# Patient Record
Sex: Female | Born: 1965 | Race: White | Hispanic: No | Smoking: Former smoker
Health system: Southern US, Community
[De-identification: ages and names within clinical notes are randomized; demographics above are authoritative.]

## PROBLEM LIST (undated history)

## (undated) DIAGNOSIS — C3481 Malignant neoplasm of overlapping sites of right bronchus and lung: Secondary | ICD-10-CM

## (undated) DIAGNOSIS — I10 Essential (primary) hypertension: Secondary | ICD-10-CM

## (undated) DIAGNOSIS — R51 Headache: Secondary | ICD-10-CM

## (undated) DIAGNOSIS — M542 Cervicalgia: Secondary | ICD-10-CM

## (undated) DIAGNOSIS — J189 Pneumonia, unspecified organism: Secondary | ICD-10-CM

## (undated) DIAGNOSIS — J449 Chronic obstructive pulmonary disease, unspecified: Secondary | ICD-10-CM

## (undated) HISTORY — DX: Malignant neoplasm of overlapping sites of right bronchus and lung: C34.81

## (undated) HISTORY — PX: CERVICAL FUSION: SHX112

## (undated) HISTORY — PX: APPENDECTOMY: SHX54

## (undated) MED FILL — Pembrolizumab IV Soln 100 MG/4ML (25 MG/ML): INTRAVENOUS | Qty: 8 | Status: AC

---

## 1998-10-07 ENCOUNTER — Other Ambulatory Visit: Admission: RE | Admit: 1998-10-07 | Discharge: 1998-10-07 | Payer: Self-pay | Admitting: Family Medicine

## 1998-12-18 ENCOUNTER — Encounter: Payer: Self-pay | Admitting: Emergency Medicine

## 1998-12-18 ENCOUNTER — Emergency Department (HOSPITAL_COMMUNITY): Admission: EM | Admit: 1998-12-18 | Discharge: 1998-12-18 | Payer: Self-pay | Admitting: Emergency Medicine

## 2000-05-28 ENCOUNTER — Encounter: Admission: RE | Admit: 2000-05-28 | Discharge: 2000-05-28 | Payer: Self-pay | Admitting: Family Medicine

## 2000-05-28 ENCOUNTER — Encounter: Payer: Self-pay | Admitting: Family Medicine

## 2000-07-17 ENCOUNTER — Other Ambulatory Visit: Admission: RE | Admit: 2000-07-17 | Discharge: 2000-07-17 | Payer: Self-pay | Admitting: Family Medicine

## 2001-12-31 ENCOUNTER — Other Ambulatory Visit: Admission: RE | Admit: 2001-12-31 | Discharge: 2001-12-31 | Payer: Self-pay | Admitting: Family Medicine

## 2003-02-12 ENCOUNTER — Encounter: Admission: RE | Admit: 2003-02-12 | Discharge: 2003-02-12 | Payer: Self-pay | Admitting: Family Medicine

## 2003-05-08 ENCOUNTER — Other Ambulatory Visit: Admission: RE | Admit: 2003-05-08 | Discharge: 2003-05-08 | Payer: Self-pay | Admitting: Family Medicine

## 2003-10-04 ENCOUNTER — Encounter: Admission: RE | Admit: 2003-10-04 | Discharge: 2003-10-04 | Payer: Self-pay | Admitting: Family Medicine

## 2003-10-22 ENCOUNTER — Inpatient Hospital Stay (HOSPITAL_COMMUNITY): Admission: RE | Admit: 2003-10-22 | Discharge: 2003-10-23 | Payer: Self-pay | Admitting: Neurosurgery

## 2004-09-13 ENCOUNTER — Other Ambulatory Visit: Admission: RE | Admit: 2004-09-13 | Discharge: 2004-09-13 | Payer: Self-pay | Admitting: Family Medicine

## 2005-01-09 ENCOUNTER — Ambulatory Visit: Payer: Self-pay | Admitting: Family Medicine

## 2005-10-09 ENCOUNTER — Encounter: Admission: RE | Admit: 2005-10-09 | Discharge: 2005-10-09 | Payer: Self-pay | Admitting: Family Medicine

## 2006-04-06 ENCOUNTER — Other Ambulatory Visit: Admission: RE | Admit: 2006-04-06 | Discharge: 2006-04-06 | Payer: Self-pay | Admitting: Family Medicine

## 2007-01-25 ENCOUNTER — Ambulatory Visit (HOSPITAL_COMMUNITY): Admission: RE | Admit: 2007-01-25 | Discharge: 2007-01-25 | Payer: Self-pay | Admitting: Family Medicine

## 2010-06-10 NOTE — Op Note (Signed)
NAMEELIM, PEALE               ACCOUNT NO.:  [id number]   MEDICAL RECORD NO.:  [id number]          PATIENT TYPE:  INP   LOCATION:  2899                         FACILITY:  [facility]   PHYSICIAN:  Hewitt Shorts, M.D.DATE OF BIRTH:  1965-04-06   DATE OF PROCEDURE:  10/22/2003  DATE OF DISCHARGE:                                 OPERATIVE REPORT   PREOPERATIVE DIAGNOSIS:  C6-7 cervical disk herniation, cervical  spondylosis, cervical degenerative disk disease, and cervical radiculopathy.   POSTOPERATIVE DIAGNOSIS:  C6-7 cervical disk herniation, cervical  spondylosis, cervical degenerative disk disease, and cervical radiculopathy.   OPERATION PERFORMED:  1.  Removal of anterior cervical plate at U9-8.  2.  C6-7 anterior cervical diskectomy and arthrodesis with iliac crest      allograft and Synthes cervical plating.   SURGEON:  Hewitt Shorts, M.D.   ASSISTANT:  Coletta Memos, M.D.   ANESTHESIA:  General endotracheal.   INDICATIONS FOR PROCEDURE:  The patient is a 45 year old woman who is seven  years status post a C5-6 anterior cervical diskectomy and arthrodesis.  She  did well from that surgery but returned with right cervical radiculopathy  and was found by MRI scan to have C6-7 cervical disk herniation off to the  right side.  Decision was made to proceed with removal of the plate at J1-9  and then proceeding with a C6-7 anterior cervical diskectomy and fusion.   DESCRIPTION OF PROCEDURE:  The patient was brought to the operating room and  placed under general endotracheal anesthesia.  The patient was placed in 10  pounds of halter traction.  The neck was prepped with Betadine soap and  solution and draped in sterile fashion.  The previous incision was  infiltrated with local anesthetic and epinephrine.  The previous incision  was reopened.  Dissection was carried down through the subcutaneous tissue  and platysma and then dissection was carried out to an avascular  plane,  leaving the sternocleidomastoid, carotid artery and jugular vein laterally  and trachea and esophagus medially.  The ventral aspect of the vertebral  column identified and the previous plate at the J4-7 level was identified  and the overlying scar tissue was gently dissected.  We exposed the screws.  The locking screws were all removed and then all four bone screws were  removed and the plate was removed.  We were able to immediately identify the  C6-7 intervertebral disk space.  There was spondylitic ventral disk  protrusion.  The annulus was incised and spondylitic overgrowth anteriorly  was removed using osteophyte removal tool.  We entered into the disk space  and diskectomy was initiated with microcurettes and pituitary rongeurs.  The  operating microscope was draped and brought into the field to provide  additional magnification, illumination and visualization and the remainder  of the decompression was performed using microdissection and microsurgical  technique.  The cartilaginous end plates of the corresponding vertebrae were  removed using X-Max drill along with microcurettes and posterior osteophytic  overgrowth was removed using the X-Max drill along with 2 mm Kerrison punch  with a thin  foot plate.  The posterior longitudinal ligament was thickened.  This was carefully opened and the disk herniation encountered.  It was  removed in a piecemeal fashion and extended in a broad based fashion with  the largest component laterally to the right compressing the exiting right  C7 nerve root.  In the end, all loose fragments of disk material were  removed and good decompression of the spinal canal and thecal sac as well as  the foramina and nerve roots was achieved bilaterally.  Once the  decompression was completed, hemostasis was established with the use of  Gelfoam soaked in Thrombin.  All the Gelfoam though was removed prior to  proceeding with the arthrodesis.  Using bone  sizers, we selected a 7 mm  graft which was hydrated in saline solution.  The graft was then positioned  in the intervertebral disk space and countersunk.  We then selected a 14 mm  CSLP Synthes plate and it was positioned over the fusion construct and  placed in an upside down orientation.  We used a 14 mm plate and 4.0 x 14 mm  cortical screws.  Screws were placed at C6 and then we drilled and tapped  holes for the screws at C7.  The screws were placed.  All four screws were  fully tightened.  Locking screws were placed.  The traction was discontinued  and an x-ray was taken which showed the screws were in good position at C6.  Graft was in good position.  The screws at C7 could not be fully visualized  because of the patient's shoulders.  However, under direct visualization,  the overall construct appeared good.  The wound was irrigated with  bacitracin solution, checked for hemostasis which was established and  confirmed and then we proceeded with closure.  The platysma was closed with  interrupted inverted 2-0 undyed Vicryl sutures.  Subcutaneous and  subcuticular layer were closed with interrupted inverted 3-0 undyed Vicryl  sutures and the skin edges were approximated with DermaBond.  The patient  tolerated the procedure well.  The estimated blood loss was 25 mL.  Sponge,  needle and instrument counts were correct.  Following surgery, the patient  was to be placed in a soft cervical collar, reversed from his anesthetic,  extubated and transferred to the recovery room for further care.       RWN/MEDQ  D:  10/22/2003  T:  10/23/2003  Job:  [id number]

## 2011-05-08 ENCOUNTER — Other Ambulatory Visit: Payer: Self-pay | Admitting: Neurosurgery

## 2011-05-09 ENCOUNTER — Encounter (HOSPITAL_COMMUNITY): Payer: Self-pay | Admitting: Pharmacy Technician

## 2011-05-11 ENCOUNTER — Encounter (HOSPITAL_COMMUNITY)
Admission: RE | Admit: 2011-05-11 | Discharge: 2011-05-11 | Disposition: A | Discharge status: Home / Self Care | Payer: BC Managed Care – PPO | Source: Ambulatory Visit | Attending: Neurosurgery | Admitting: Neurosurgery

## 2011-05-11 ENCOUNTER — Encounter (HOSPITAL_COMMUNITY): Payer: Self-pay

## 2011-05-11 HISTORY — DX: Essential (primary) hypertension: I10

## 2011-05-11 HISTORY — DX: Cervicalgia: M54.2

## 2011-05-11 HISTORY — DX: Headache: R51

## 2011-05-11 HISTORY — DX: Chronic obstructive pulmonary disease, unspecified: J44.9

## 2011-05-11 LAB — BASIC METABOLIC PANEL
BUN: 7 mg/dL (ref 6–23)
CO2: 27 mEq/L (ref 19–32)
Calcium: 9.5 mg/dL (ref 8.4–10.5)
Chloride: 96 mEq/L (ref 96–112)
Creatinine, Ser: 0.59 mg/dL (ref 0.50–1.10)
GFR calc Af Amer: >90 mL/min (ref >90)
GFR calc non Af Amer: >90 mL/min (ref >90)
Glucose, Bld: 84 mg/dL (ref 70–99)
Potassium: 4.1 mEq/L (ref 3.5–5.1)
Sodium: 131 mEq/L — ABNORMAL LOW (ref 135–145)

## 2011-05-11 LAB — CBC
HCT: 36.7 % (ref 36.0–46.0)
Hemoglobin: 13.1 g/dL (ref 12.0–15.0)
MCH: 32.3 pg (ref 26.0–34.0)
MCHC: 35.7 g/dL (ref 30.0–36.0)
MCV: 90.6 fL (ref 78.0–100.0)
Platelets: 262 10*3/uL (ref 150–400)
RBC: 4.05 MIL/uL (ref 3.87–5.11)
RDW: 11.9 % (ref 11.5–15.5)
WBC: 7.6 10*3/uL (ref 4.0–10.5)

## 2011-05-11 LAB — SURGICAL PCR SCREEN
MRSA, PCR: NEGATIVE
Staphylococcus aureus: NEGATIVE

## 2011-05-11 LAB — NO BLOOD PRODUCTS

## 2011-05-11 NOTE — Pre-Procedure Instructions (Signed)
20 Heather Gutierrez  05/11/2011   Your procedure is scheduled on:  April 22  Report to [facility] at 0530 AM.  Call this number if you have problems the morning of surgery: [phone]   Remember:   Do not eat food:After Midnight.  May have clear liquids: up to 4 Hours before arrival.  Clear liquids include soda, tea, black coffee, apple or grape juice, broth.  Take these medicines the morning of surgery with A SIP OF WATER: NONE   Do not wear jewelry, make-up or nail polish.  Do not wear lotions, powders, or perfumes. You may wear deodorant.  Do not shave 48 hours prior to surgery.  Do not bring valuables to the hospital.  Contacts, dentures or bridgework may not be worn into surgery.  Leave suitcase in the car. After surgery it may be brought to your room.  For patients admitted to the hospital, checkout time is 11:00 AM the day of discharge.   Patients discharged the day of surgery will not be allowed to drive home.  Name and phone number of your driver: FAMILY  Special Instructions: CHG Shower Use Special Wash: 1/2 bottle night before surgery and 1/2 bottle morning of surgery.   Please read over the following fact sheets that you were given: Pain Booklet, MRSA Information and Surgical Site Infection Prevention

## 2011-05-11 NOTE — Progress Notes (Signed)
REQUESTED RECORDS [facility], DR [facility] CXR,& EKG,OTHER STUDIES

## 2011-05-12 NOTE — Progress Notes (Addendum)
Left message for [facility] in medical records requesting EKG from [facility].

## 2011-05-12 NOTE — Consult Note (Signed)
Anesthesia Chart Review:  Patient is a 46 year old female scheduled for C4-5 ACDF on 05/15/11.  History includes former smoker, COPD, headaches, HTN, neck pain, appendectomy, and cervical fusion.  PCP is Maura Hamrick.   Labs noted.  She has refused all blood products.  H//H 13.1/36.7.  She was evaluated for SOB/CXR on 04/17/11 at [facility].  She had a CXR, CTA chest, and stress echo.  CXR showed minimal bibasilar ATX, minimal vascular congestion.  CTA showed no PE, mild emphysema, minimal bibasilar ATX, scattered 3mm pulmonary nodules bilaterally thought to be post-infectious in nature.   Stress echo showed normal exercise tolerance, negative treadmill for ischemia, negative stress echo for ischemia.  EF was estimated at 55-60%.    Patient was ultimately diagnosed and treated for acute bronchitis.  An EKG is still pending.  I've asked the staff to re-request from [facility].    Since she had a negative stress echo just last month, anticipate she can proceed.  Would not plan to repeat her CXR preoperatively unless she is having new or significant residual respiratory symptoms from her bronchitis last month.

## 2011-05-14 MED ORDER — CEFAZOLIN SODIUM 1-5 GM-% IV SOLN
1.0000 g | INTRAVENOUS | Status: AC
  Administered 2011-05-15: 1 g via INTRAVENOUS
  Filled 2011-05-14: qty 50

## 2011-05-15 ENCOUNTER — Encounter (HOSPITAL_COMMUNITY)
Admission: RE | Disposition: A | Discharge status: Home / Self Care | Payer: Self-pay | Source: Ambulatory Visit | Attending: Neurosurgery

## 2011-05-15 ENCOUNTER — Encounter (HOSPITAL_COMMUNITY): Payer: Self-pay | Admitting: *Deleted

## 2011-05-15 ENCOUNTER — Ambulatory Visit (HOSPITAL_COMMUNITY)
Admission: RE | Admit: 2011-05-15 | Discharge: 2011-05-16 | Disposition: A | Discharge status: Home / Self Care | Payer: BC Managed Care – PPO | Source: Ambulatory Visit | Attending: Neurosurgery | Admitting: Neurosurgery

## 2011-05-15 ENCOUNTER — Ambulatory Visit (HOSPITAL_COMMUNITY): Payer: BC Managed Care – PPO | Admitting: Vascular Surgery

## 2011-05-15 ENCOUNTER — Ambulatory Visit (HOSPITAL_COMMUNITY): Payer: BC Managed Care – PPO

## 2011-05-15 ENCOUNTER — Encounter (HOSPITAL_COMMUNITY): Payer: Self-pay | Admitting: Vascular Surgery

## 2011-05-15 DIAGNOSIS — G8250 Quadriplegia, unspecified: Secondary | ICD-10-CM | POA: Insufficient documentation

## 2011-05-15 DIAGNOSIS — M4712 Other spondylosis with myelopathy, cervical region: Secondary | ICD-10-CM | POA: Insufficient documentation

## 2011-05-15 DIAGNOSIS — R51 Headache: Secondary | ICD-10-CM | POA: Insufficient documentation

## 2011-05-15 DIAGNOSIS — J4489 Other specified chronic obstructive pulmonary disease: Secondary | ICD-10-CM | POA: Insufficient documentation

## 2011-05-15 DIAGNOSIS — Z01812 Encounter for preprocedural laboratory examination: Secondary | ICD-10-CM | POA: Insufficient documentation

## 2011-05-15 DIAGNOSIS — I10 Essential (primary) hypertension: Secondary | ICD-10-CM | POA: Insufficient documentation

## 2011-05-15 DIAGNOSIS — M5000 Cervical disc disorder with myelopathy, unspecified cervical region: Secondary | ICD-10-CM | POA: Insufficient documentation

## 2011-05-15 DIAGNOSIS — J449 Chronic obstructive pulmonary disease, unspecified: Secondary | ICD-10-CM | POA: Insufficient documentation

## 2011-05-15 HISTORY — PX: ANTERIOR CERVICAL DECOMP/DISCECTOMY FUSION: SHX1161

## 2011-05-15 SURGERY — ANTERIOR CERVICAL DECOMPRESSION/DISCECTOMY FUSION 1 LEVEL
Anesthesia: General

## 2011-05-15 MED ORDER — MORPHINE SULFATE 4 MG/ML IJ SOLN
4.0000 mg | INTRAMUSCULAR | Status: DC | PRN
  Administered 2011-05-15: 4 mg via INTRAMUSCULAR
  Filled 2011-05-15: qty 1

## 2011-05-15 MED ORDER — BUPIVACAINE HCL (PF) 0.25 % IJ SOLN
INTRAMUSCULAR | Status: DC | PRN
  Administered 2011-05-15: 5 mL

## 2011-05-15 MED ORDER — KCL IN DEXTROSE-NACL 20-5-0.45 MEQ/L-%-% IV SOLN
INTRAVENOUS | Status: AC
  Administered 2011-05-15: 1000 mL
  Filled 2011-05-15: qty 1000

## 2011-05-15 MED ORDER — DROPERIDOL 2.5 MG/ML IJ SOLN
0.6250 mg | INTRAMUSCULAR | Status: DC | PRN
Start: 2011-05-15 — End: 2011-05-15

## 2011-05-15 MED ORDER — ALUM & MAG HYDROXIDE-SIMETH 200-200-20 MG/5ML PO SUSP: 30.0000 mL | Freq: Four times a day (QID) | ORAL | Status: DC | PRN

## 2011-05-15 MED ORDER — MENTHOL 3 MG MT LOZG: 1.0000 | LOZENGE | OROMUCOSAL | Status: DC | PRN

## 2011-05-15 MED ORDER — SODIUM CHLORIDE 0.9 % IJ SOLN
3.0000 mL | Freq: Two times a day (BID) | INTRAMUSCULAR | Status: DC
  Administered 2011-05-15 – 2011-05-16 (×2): 3 mL via INTRAVENOUS

## 2011-05-15 MED ORDER — HYDROCODONE-ACETAMINOPHEN 5-325 MG PO TABS: 1.0000 | ORAL_TABLET | ORAL | Status: DC | PRN

## 2011-05-15 MED ORDER — GLYCOPYRROLATE 0.2 MG/ML IJ SOLN
INTRAMUSCULAR | Status: DC | PRN
  Administered 2011-05-15: 0.4 mg via INTRAVENOUS

## 2011-05-15 MED ORDER — HYDROMORPHONE HCL PF 1 MG/ML IJ SOLN
INTRAMUSCULAR | Status: AC
  Filled 2011-05-15: qty 1

## 2011-05-15 MED ORDER — ACETAMINOPHEN 325 MG PO TABS: 650.0000 mg | ORAL_TABLET | ORAL | Status: DC | PRN

## 2011-05-15 MED ORDER — SODIUM CHLORIDE 0.9 % IV SOLN: 250.0000 mL | INTRAVENOUS | Status: DC

## 2011-05-15 MED ORDER — OXYCODONE-ACETAMINOPHEN 5-325 MG PO TABS
1.0000 | ORAL_TABLET | ORAL | Status: DC | PRN
  Administered 2011-05-15 – 2011-05-16 (×5): 2 via ORAL
  Filled 2011-05-15 (×5): qty 2

## 2011-05-15 MED ORDER — HEMOSTATIC AGENTS (NO CHARGE) OPTIME
TOPICAL | Status: DC | PRN
  Administered 2011-05-15: 1 via TOPICAL

## 2011-05-15 MED ORDER — KETOROLAC TROMETHAMINE 30 MG/ML IJ SOLN
INTRAMUSCULAR | Status: AC
  Filled 2011-05-15: qty 1

## 2011-05-15 MED ORDER — MAGNESIUM HYDROXIDE 400 MG/5ML PO SUSP: 30.0000 mL | Freq: Every day | ORAL | Status: DC | PRN

## 2011-05-15 MED ORDER — HYDROXYZINE HCL 50 MG/ML IM SOLN: 50.0000 mg | INTRAMUSCULAR | Status: DC | PRN

## 2011-05-15 MED ORDER — ONDANSETRON HCL 4 MG/2ML IJ SOLN
INTRAMUSCULAR | Status: DC | PRN
  Administered 2011-05-15: 4 mg via INTRAVENOUS

## 2011-05-15 MED ORDER — KCL IN DEXTROSE-NACL 20-5-0.45 MEQ/L-%-% IV SOLN
INTRAVENOUS | Status: DC
  Filled 2011-05-15 (×5): qty 1000

## 2011-05-15 MED ORDER — ZOLPIDEM TARTRATE 5 MG PO TABS: 10.0000 mg | ORAL_TABLET | Freq: Every evening | ORAL | Status: DC | PRN

## 2011-05-15 MED ORDER — SODIUM CHLORIDE 0.9 % IJ SOLN: 3.0000 mL | INTRAMUSCULAR | Status: DC | PRN

## 2011-05-15 MED ORDER — BISACODYL 10 MG RE SUPP: 10.0000 mg | Freq: Every day | RECTAL | Status: DC | PRN

## 2011-05-15 MED ORDER — FENTANYL CITRATE 0.05 MG/ML IJ SOLN
INTRAMUSCULAR | Status: DC | PRN
  Administered 2011-05-15 (×3): 50 ug via INTRAVENOUS
  Administered 2011-05-15: 100 ug via INTRAVENOUS

## 2011-05-15 MED ORDER — PHENOL 1.4 % MT LIQD: 1.0000 | OROMUCOSAL | Status: DC | PRN

## 2011-05-15 MED ORDER — CYCLOBENZAPRINE HCL 10 MG PO TABS
10.0000 mg | ORAL_TABLET | Freq: Three times a day (TID) | ORAL | Status: DC | PRN
  Administered 2011-05-15 – 2011-05-16 (×2): 10 mg via ORAL
  Filled 2011-05-15 (×2): qty 1

## 2011-05-15 MED ORDER — DIAZEPAM 5 MG/ML IJ SOLN
INTRAMUSCULAR | Status: AC
  Filled 2011-05-15: qty 2

## 2011-05-15 MED ORDER — HYDROMORPHONE HCL PF 1 MG/ML IJ SOLN
0.2500 mg | INTRAMUSCULAR | Status: DC | PRN
  Administered 2011-05-15 (×4): 0.5 mg via INTRAVENOUS

## 2011-05-15 MED ORDER — LACTATED RINGERS IV SOLN
INTRAVENOUS | Status: DC | PRN
  Administered 2011-05-15 (×2): via INTRAVENOUS

## 2011-05-15 MED ORDER — HYDROXYZINE HCL 25 MG PO TABS: 50.0000 mg | ORAL_TABLET | ORAL | Status: DC | PRN

## 2011-05-15 MED ORDER — KETOROLAC TROMETHAMINE 30 MG/ML IJ SOLN
30.0000 mg | Freq: Four times a day (QID) | INTRAMUSCULAR | Status: DC
  Administered 2011-05-15 – 2011-05-16 (×4): 30 mg via INTRAVENOUS
  Filled 2011-05-15 (×7): qty 1

## 2011-05-15 MED ORDER — 0.9 % SODIUM CHLORIDE (POUR BTL) OPTIME
TOPICAL | Status: DC | PRN
  Administered 2011-05-15: 1000 mL

## 2011-05-15 MED ORDER — ROCURONIUM BROMIDE 100 MG/10ML IV SOLN
INTRAVENOUS | Status: DC | PRN
  Administered 2011-05-15: 50 mg via INTRAVENOUS
  Administered 2011-05-15: 20 mg via INTRAVENOUS

## 2011-05-15 MED ORDER — LOSARTAN POTASSIUM 50 MG PO TABS: 100.0000 mg | ORAL_TABLET | Freq: Every day | ORAL | Status: DC

## 2011-05-15 MED ORDER — THROMBIN 5000 UNITS EX SOLR
CUTANEOUS | Status: DC | PRN
  Administered 2011-05-15 (×2): 5000 [IU] via TOPICAL

## 2011-05-15 MED ORDER — ALBUTEROL SULFATE HFA 108 (90 BASE) MCG/ACT IN AERS
INHALATION_SPRAY | RESPIRATORY_TRACT | Status: DC | PRN
  Administered 2011-05-15: 2 via RESPIRATORY_TRACT

## 2011-05-15 MED ORDER — NEOSTIGMINE METHYLSULFATE 1 MG/ML IJ SOLN
INTRAMUSCULAR | Status: DC | PRN
  Administered 2011-05-15: 3 mg via INTRAVENOUS

## 2011-05-15 MED ORDER — KETOROLAC TROMETHAMINE 30 MG/ML IJ SOLN
30.0000 mg | Freq: Once | INTRAMUSCULAR | Status: AC
  Administered 2011-05-15: 30 mg via INTRAVENOUS

## 2011-05-15 MED ORDER — ACETAMINOPHEN 650 MG RE SUPP: 650.0000 mg | RECTAL | Status: DC | PRN

## 2011-05-15 MED ORDER — DEXAMETHASONE SODIUM PHOSPHATE 4 MG/ML IJ SOLN
INTRAMUSCULAR | Status: DC | PRN
  Administered 2011-05-15: 8 mg via INTRAVENOUS

## 2011-05-15 MED ORDER — LIDOCAINE-EPINEPHRINE 1 %-1:100000 IJ SOLN
INTRAMUSCULAR | Status: DC | PRN
  Administered 2011-05-15: 5 mL

## 2011-05-15 MED ORDER — LOSARTAN POTASSIUM 50 MG PO TABS
100.0000 mg | ORAL_TABLET | Freq: Every day | ORAL | Status: DC
  Administered 2011-05-15 – 2011-05-16 (×2): 100 mg via ORAL
  Filled 2011-05-15 (×3): qty 2

## 2011-05-15 MED ORDER — EPHEDRINE SULFATE 50 MG/ML IJ SOLN
INTRAMUSCULAR | Status: DC | PRN
  Administered 2011-05-15: 5 mg via INTRAVENOUS
  Administered 2011-05-15: 10 mg via INTRAVENOUS

## 2011-05-15 MED ORDER — MIDAZOLAM HCL 5 MG/5ML IJ SOLN
INTRAMUSCULAR | Status: DC | PRN
  Administered 2011-05-15: 1 mg via INTRAVENOUS

## 2011-05-15 MED ORDER — PHENYLEPHRINE HCL 10 MG/ML IJ SOLN
INTRAMUSCULAR | Status: DC | PRN
  Administered 2011-05-15 (×2): 40 ug via INTRAVENOUS

## 2011-05-15 MED ORDER — DIAZEPAM 5 MG/ML IJ SOLN
2.5000 mg | Freq: Once | INTRAMUSCULAR | Status: AC
  Administered 2011-05-15: 2.5 mg via INTRAVENOUS

## 2011-05-15 MED ORDER — PROPOFOL 10 MG/ML IV EMUL
INTRAVENOUS | Status: DC | PRN
  Administered 2011-05-15: 180 mg via INTRAVENOUS

## 2011-05-15 SURGICAL SUPPLY — 51 items
ALLOGRAFT 7X14X11 (Bone Implant) ×2 IMPLANT
BAG DECANTER FOR FLEXI CONT (MISCELLANEOUS) ×2 IMPLANT
BIT DRILL NEURO 2X3.1 SFT TUCH (MISCELLANEOUS) ×1 IMPLANT
BLADE ULTRA TIP 2M (BLADE) ×2 IMPLANT
BRUSH SCRUB EZ PLAIN DRY (MISCELLANEOUS) ×2 IMPLANT
CANISTER SUCTION 2500CC (MISCELLANEOUS) ×2 IMPLANT
CLOTH BEACON ORANGE TIMEOUT ST (SAFETY) ×2 IMPLANT
CONT SPEC 4OZ CLIKSEAL STRL BL (MISCELLANEOUS) ×2 IMPLANT
COVER MAYO STAND STRL (DRAPES) ×2 IMPLANT
DECANTER SPIKE VIAL GLASS SM (MISCELLANEOUS) ×2 IMPLANT
DERMABOND ADVANCED (GAUZE/BANDAGES/DRESSINGS) ×1
DERMABOND ADVANCED .7 DNX12 (GAUZE/BANDAGES/DRESSINGS) ×1 IMPLANT
DRAPE LAPAROTOMY 100X72 PEDS (DRAPES) ×2 IMPLANT
DRAPE MICROSCOPE LEICA (MISCELLANEOUS) ×2 IMPLANT
DRAPE POUCH INSTRU U-SHP 10X18 (DRAPES) ×2 IMPLANT
DRAPE PROXIMA HALF (DRAPES) IMPLANT
DRILL NEURO 2X3.1 SOFT TOUCH (MISCELLANEOUS) ×2
ELECT COATED BLADE 2.86 ST (ELECTRODE) ×2 IMPLANT
ELECT REM PT RETURN 9FT ADLT (ELECTROSURGICAL) ×2
ELECTRODE REM PT RTRN 9FT ADLT (ELECTROSURGICAL) ×1 IMPLANT
GLOVE BIO SURGEON STRL SZ8.5 (GLOVE) ×2 IMPLANT
GLOVE BIOGEL PI IND STRL 8 (GLOVE) ×3 IMPLANT
GLOVE BIOGEL PI INDICATOR 8 (GLOVE) ×3
GLOVE ECLIPSE 7.5 STRL STRAW (GLOVE) ×2 IMPLANT
GLOVE EXAM NITRILE LRG STRL (GLOVE) IMPLANT
GLOVE EXAM NITRILE MD LF STRL (GLOVE) ×2 IMPLANT
GLOVE EXAM NITRILE XL STR (GLOVE) IMPLANT
GLOVE EXAM NITRILE XS STR PU (GLOVE) IMPLANT
GLOVE INDICATOR 8.5 STRL (GLOVE) ×4 IMPLANT
GLOVE OPTIFIT SS 8.0 STRL (GLOVE) ×2 IMPLANT
GOWN BRE IMP SLV AUR LG STRL (GOWN DISPOSABLE) IMPLANT
GOWN BRE IMP SLV AUR XL STRL (GOWN DISPOSABLE) ×6 IMPLANT
GOWN STRL REIN 2XL LVL4 (GOWN DISPOSABLE) ×4 IMPLANT
HEAD HALTER (SOFTGOODS) ×2 IMPLANT
KIT BASIN OR (CUSTOM PROCEDURE TRAY) ×2 IMPLANT
KIT ROOM TURNOVER OR (KITS) ×2 IMPLANT
NEEDLE HYPO 25X1 1.5 SAFETY (NEEDLE) ×2 IMPLANT
NEEDLE SPNL 22GX3.5 QUINCKE BK (NEEDLE) ×2 IMPLANT
NS IRRIG 1000ML POUR BTL (IV SOLUTION) ×2 IMPLANT
PACK LAMINECTOMY NEURO (CUSTOM PROCEDURE TRAY) ×2 IMPLANT
PAD ARMBOARD 7.5X6 YLW CONV (MISCELLANEOUS) ×6 IMPLANT
RUBBERBAND STERILE (MISCELLANEOUS) ×4 IMPLANT
SPONGE INTESTINAL PEANUT (DISPOSABLE) ×2 IMPLANT
SPONGE SURGIFOAM ABS GEL SZ50 (HEMOSTASIS) ×2 IMPLANT
STAPLER VISISTAT 35W (STAPLE) ×2 IMPLANT
SUT VIC AB 2-0 CP2 18 (SUTURE) ×2 IMPLANT
SUT VIC AB 3-0 SH 8-18 (SUTURE) ×2 IMPLANT
SYR 20ML ECCENTRIC (SYRINGE) ×2 IMPLANT
TOWEL OR 17X24 6PK STRL BLUE (TOWEL DISPOSABLE) ×2 IMPLANT
TOWEL OR 17X26 10 PK STRL BLUE (TOWEL DISPOSABLE) ×2 IMPLANT
WATER STERILE IRR 1000ML POUR (IV SOLUTION) ×2 IMPLANT

## 2011-05-15 NOTE — Progress Notes (Signed)
Filed Vitals:   05/15/11 1050 05/15/11 1054 05/15/11 1140 05/15/11 1636  BP:   135/84 125/77  Pulse: 72 72 63 72  Temp:  97 F (36.1 C) 97.3 F (36.3 C) 97.3 F (36.3 C)  TempSrc:      Resp: 20 11 18 16   SpO2: 99% 99% 93% 97%    Patient is making progress postoperatively. Up and ambulating. Patient has voided. Wound clean and dry. Moving all 4 extremities well. Patient notes improvement already in left lower extremity and some improvement already in left upper extremity.  Plan: Encouraged continued ambulate in the hall.  Hewitt Shorts, MD 05/15/2011, 6:00 PM

## 2011-05-15 NOTE — Op Note (Signed)
05/15/2011  10:00 AM  PATIENT:  Heather Gutierrez  46 y.o. female  PRE-OPERATIVE DIAGNOSIS:  cervical hernaited disc cervical spondylosis cervical stenosis quadriparesis  POST-OPERATIVE DIAGNOSIS:  cervical hernaited disc cervical spondylosis cervical stenosis quadriparesis  PROCEDURE:  Procedure(s): ANTERIOR CERVICAL DECOMPRESSION/DISCECTOMY FUSION 1 LEVEL: C4-5 anterior cervical decompression and arthrodesis with allograft and tether cervical plating  SURGEON:  Surgeon(s): Hewitt Shorts, MD Cristi Loron, MD  ASSISTANTS: Tressie Stalker, M.D.  ANESTHESIA:   general  EBL:  Total I/O In: 1700 [I.V.:1700] Out: 200 [Blood:200]  BLOOD ADMINISTERED:none  COUNT: Correct per nursing staff  DICTATION: Patient was brought to the operating room placed under general endotracheal anesthesia. Patient was placed in 10 pounds of halter traction. The neck was prepped with Betadine soap and solution and draped in a sterile fashion. A horizontal incision was made on the left side of the neck. The line of the incision was infiltrated with local anesthetic with epinephrine. Dissection was carried down thru the subcutaneous tissue and platysma, bipolar cautery was used to maintain hemostasis. Dissection was then carried out thru an avascular plane leaving the sternocleidomastoid carotid artery and jugular vein laterally and the trachea and esophagus medially. The ventral aspect of the vertebral column was identified and a localizing x-ray was taken. The C4-5 level was identified. The annulus was incised and the disc space entered. Discectomy was performed with micro-curettes and pituitary rongeurs. The operating microscope was draped and brought into the field provided additional magnification illumination and visualization. Discectomy was continued posteriorly thru the disc space and then the cartilaginous endplate was removed using micro-curettes along with the high-speed drill. Posterior osteophytic  overgrowth was removed using the high-speed drill along with a 2 mm thin footplate Kerrison punch. Posterior longitudinal ligament along with disc herniation was carefully removed, decompressing the spinal canal and thecal sac. The disc herniation was very spondylitic. Portions of the disc herniation and posterior longitudinal ligament were adherent to the ventral aspect of the dura. In that end good decompression of the thecal sac was achieved. A small defect in the dura occurred as the adherent disc and PLL was removed, but the arachnoid was intact, and no CSF leakage occurred. We then continued to remove osteophytic overgrowth and disc material, decompressing the neural foramina and exiting nerve roots bilaterally. Once the decompression was completed hemostasis was established with the use of Gelfoam with thrombin and bipolar cautery. The Gelfoam was removed the wound irrigated and hemostasis confirmed. We then measured the height of the intravertebral disc space and selected a 7 millimeter in height structural allograft. It was hydrated and saline solution and then gently positioned in the intravertebral disc space and countersunk. We then selected a 14 millimeter in height Tether cervical plate. It was positioned over the fusion construct and secured to the vertebra with 4 x 13 mm screws. Using a pair of fixed screws at C4, and a pair of variable screws at C5. Each screw hole was started with the high-speed drill and then the screws placed once all the screws were placed final tightening was performed. The wound was irrigated with bacitracin solution checked for hemostasis which was established and confirmed. An x-ray was taken which showed grafts in good position, and the plate and screws in good position, the overall alignment was good. We then proceeded with closure. The platysma was closed with interrupted inverted 2-0 undyed Vicryl suture, the subcutaneous and subcuticular closed with interrupted inverted  3-0 undyed Vicryl suture. The skin edges were approximated  with Dermabond. Following surgery the patient was taken out of cervical traction. To be reversed and the anesthetic and taken to the recovery room for further care.   PLAN OF CARE: Admit for overnight observation  PATIENT DISPOSITION:  PACU - hemodynamically stable.   Delay start of Pharmacological VTE agent (>24hrs) due to surgical blood loss or risk of bleeding:  yes

## 2011-05-15 NOTE — Anesthesia Postprocedure Evaluation (Signed)
Anesthesia Post Note  Patient: Heather Gutierrez  Procedure(s) Performed: Procedure(s) (LRB): ANTERIOR CERVICAL DECOMPRESSION/DISCECTOMY FUSION 1 LEVEL (N/A)  Anesthesia type: general  Patient location: PACU  Post pain: Pain level controlled  Post assessment: Patient's Cardiovascular Status Stable  Last Vitals:  Filed Vitals:   05/15/11 1054  BP:   Pulse: 72  Temp: 36.1 C  Resp: 11    Post vital signs: Reviewed and stable  Level of consciousness: sedated  Complications: No apparent anesthesia complications

## 2011-05-15 NOTE — H&P (Signed)
Subjective: Patient is a 46 y.o. female who is admitted for treatment of cervical myelopathy with quadriparesis secondary to a large C4-5 cervical disc herniation with severe spinal cord compression, increased signal within the spinal cord . Patient presented with complaints of numbness began in the left upper extremity in December 2012 to the left torso and left lower extremities. She also describes weakness in the left hand. On exam though she was found to have bilateral weakness.   Past Medical History  Diagnosis Date  . Hypertension   . Neck pain   . COPD (chronic obstructive pulmonary disease)   . Headache     Past Surgical History  Procedure Date  . Appendectomy   . Cervical fusion     X 2    Prescriptions prior to admission  Medication Sig Dispense Refill  . losartan (COZAAR) 100 MG tablet Take 100 mg by mouth daily.       No Known Allergies  History  Substance Use Topics  . Smoking status: Former Smoker -- 1.0 packs/day for 33 years  . Smokeless tobacco: Not on file  . Alcohol Use:     History reviewed. No pertinent family history.   Review of Systems A comprehensive review of systems was negative.  Objective: Vital signs in last 24 hours: Temp:  [98.5 F (36.9 C)] 98.5 F (36.9 C) (04/22 0628) Pulse Rate:  [69] 69  (04/22 0628) Resp:  [18] 18  (04/22 0628) BP: (97)/(72) 97/72 mmHg (04/22 0628) SpO2:  [99 %] 99 % (04/22 0628)  EXAM: Patient is a thin white female in no acute distress. Lungs are clear to auscultation , the patient has symmetrical respiratory excursion. Heart has a regular rate and rhythm normal S1 and S2 no murmur.   Abdomen is soft nontender nondistended bowel sounds are present. Extremity examination shows no clubbing cyanosis or edema. Musculoskeletal examination shows no tenderness to palpation of the cervical spinous processes or paracervical musculature. She has a good range of motion neck flexion extension and lateral flexion to either  side. Neurologic examination shows quadriparesis with particular weakness in the distal upper extremities. Deltoids and biceps are 5 bilaterally, triceps are 4 minus bilaterally left intrinsics are 3-4 minus and right intrinsics are 4 minus. Left hip is 4 minus to 4 and right grip is 5. The iliopsoas quadriceps dorsiflexor plantar flexor 5 bilaterally. The left extensor longus is 5 and the right is 4. Since examination of decreased sensation to pinprick in the left hand compared to the right hip the left third fourth and fifth digits. Also pinprick is decreased the left foot is of the right foot. Reflexes: The biceps and brachialis are minimal to absent, triceps is trace, quadriceps are minimal and symmetrical bilaterally the left question is is absent in the right is minimal. Toes are downgoing bilaterally. She has a normal gait and stance.  Data Review:CBC    Component Value Date/Time   WBC 7.6 05/11/2011 1559   RBC 4.05 05/11/2011 1559   HGB 13.1 05/11/2011 1559   HCT 36.7 05/11/2011 1559   PLT 262 05/11/2011 1559   MCV 90.6 05/11/2011 1559   MCH 32.3 05/11/2011 1559   MCHC 35.7 05/11/2011 1559   RDW 11.9 05/11/2011 1559                          BMET    Component Value Date/Time   NA 131* 05/11/2011 1559   K 4.1 05/11/2011 1559  CL 96 05/11/2011 1559   CO2 27 05/11/2011 1559   GLUCOSE 84 05/11/2011 1559   BUN 7 05/11/2011 1559   CREATININE 0.59 05/11/2011 1559   CALCIUM 9.5 05/11/2011 1559   GFRNONAA >90 05/11/2011 1559   GFRAA >90 05/11/2011 1559     Assessment/Plan: Patient with comparison secondary to cervical myelopathy secondary to large C4-5 cervical disc herniation superimposed on underlying degenerative disc disease and spondylosis with severe spinal cord compression and increased signal within the spinal cord. 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 Patient is admitted now for single level C4-5 anterior cervical decompression and arthrodesis with allograft and  cervical plating. I've discussed with the patient the nature of his condition, the nature the surgical procedure, the typical length of surgery, hospital stay, and overall recuperation. We discussed limitations postoperatively. I discussed risks of surgery including risks of infection, bleeding, possibly need for transfusion, the risk of nerve root dysfunction with pain, weakness, numbness, or paresthesias, the risk of spinal cord dysfunction with paralysis of all 4 limbs and quadriplegia, and the risk of dural tear and CSF leakage and possible need for further surgery, the risk of esophageal dysfunction causing dysphagia and the risk of laryngeal dysfunction causing hoarseness of the voice, the risk of failure of the arthrodesis and the possible need for further surgery, and the risk of anesthetic complications including myocardial infarction, stroke, pneumonia, and death. We also discussed the need for postoperative immobilization in a cervical collar. Understanding all this the patient does wish to proceed with surgery and is admitted for such.    Hewitt Shorts, MD 05/15/2011 7:42 AM

## 2011-05-15 NOTE — Anesthesia Preprocedure Evaluation (Addendum)
Anesthesia Evaluation  Patient identified by MRN, date of birth, ID band Patient awake    Reviewed: Allergy & Precautions, H&P , NPO status , Patient's Chart, lab work & pertinent test results, reviewed documented beta blocker date and time   Airway Mallampati: I TM Distance: <3 FB     Dental  (+) Lower Dentures and Partial Upper   Pulmonary COPD COPD inhaler,  breath sounds clear to auscultation  Pulmonary exam normal       Cardiovascular hypertension, Pt. on medications Rhythm:Regular Rate:Normal     Neuro/Psych  Headaches, negative psych ROS   GI/Hepatic negative GI ROS, Neg liver ROS,   Endo/Other  negative endocrine ROS  Renal/GU negative Renal ROS     Musculoskeletal   Abdominal (+)  Abdomen: soft. Bowel sounds: normal.  Peds  Hematology  (+) REFUSES BLOOD PRODUCTS,   Anesthesia Other Findings   Reproductive/Obstetrics                        Anesthesia Physical Anesthesia Plan  ASA: III  Anesthesia Plan: General   Post-op Pain Management:    Induction: Intravenous  Airway Management Planned: Oral ETT  Additional Equipment:   Intra-op Plan:   Post-operative Plan: Extubation in OR  Informed Consent: I have reviewed the patients History and Physical, chart, labs and discussed the procedure including the risks, benefits and alternatives for the proposed anesthesia with the patient or authorized representative who has indicated his/her understanding and acceptance.   Dental advisory given  Plan Discussed with: CRNA, Anesthesiologist and Surgeon  Anesthesia Plan Comments:         Anesthesia Quick Evaluation

## 2011-05-15 NOTE — Anesthesia Procedure Notes (Signed)
Procedure Name: Intubation Date/Time: 05/15/2011 7:57 AM Performed by: Ellin Goodie Pre-anesthesia Checklist: Patient identified, Emergency Drugs available, Suction available, Patient being monitored and Timeout performed Patient Re-evaluated:Patient Re-evaluated prior to inductionOxygen Delivery Method: Circle system utilized Preoxygenation: Pre-oxygenation with 100% oxygen Intubation Type: IV induction Ventilation: Mask ventilation without difficulty Laryngoscope Size: Mac and 3 Grade View: Grade I Tube type: Oral Tube size: 7.5 mm Number of attempts: 1 Airway Equipment and Method: Stylet Placement Confirmation: ETT inserted through vocal cords under direct vision,  positive ETCO2 and breath sounds checked- equal and bilateral Secured at: 21 cm Tube secured with: Tape Dental Injury: Teeth and Oropharynx as per pre-operative assessment

## 2011-05-15 NOTE — Transfer of Care (Signed)
Immediate Anesthesia Transfer of Care Note  Patient: Heather Gutierrez  Procedure(s) Performed: Procedure(s) (LRB): ANTERIOR CERVICAL DECOMPRESSION/DISCECTOMY FUSION 1 LEVEL (N/A)  Patient Location: PACU  Anesthesia Type: General  Level of Consciousness: awake  Airway & Oxygen Therapy: Patient Spontanous Breathing  Post-op Assessment: Report given to PACU RN  Post vital signs: stable  Complications: No apparent anesthesia complications

## 2011-05-15 NOTE — Plan of Care (Signed)
Problem: Consults Goal: Diagnosis - Spinal Surgery Outcome: Completed/Met Date Met:  05/15/11 C 4-5 ACDF

## 2011-05-15 NOTE — Preoperative (Signed)
Beta Blockers   Reason not to administer Beta Blockers:Not Applicable 

## 2011-05-16 ENCOUNTER — Encounter (HOSPITAL_COMMUNITY): Payer: Self-pay | Admitting: Neurosurgery

## 2011-05-16 MED ORDER — CYCLOBENZAPRINE HCL 10 MG PO TABS: 10.0000 mg | ORAL_TABLET | Freq: Three times a day (TID) | ORAL | Status: AC | PRN

## 2011-05-16 MED ORDER — OXYCODONE-ACETAMINOPHEN 5-325 MG PO TABS: 1.0000 | ORAL_TABLET | ORAL | Status: AC | PRN

## 2011-05-16 NOTE — Discharge Summary (Signed)
Physician Discharge Summary  Patient ID: Heather Gutierrez MRN: [id number] DOB/AGE: 09/29/1965 46 y.o.  Admit date: 05/15/2011 Discharge date: 05/16/2011  Admission Diagnoses: C4-5 cervical herniated disc, cervical spondylosis, cervical stenosis, quadriparesis  Discharge Diagnoses: C4-5 cervical herniated disc, cervical spondylosis, cervical stenosis, quadriparesis  Discharged Condition: good  Hospital Course: Patient was admitted underwent a C4-5 anterior cervical decompression and arthrodesis with allograft and tether cervical plating. She has done well following surgery with significant improvement in her symptoms. She's been up and living in the halls actively. Her wound is healing nicely. She is being discharged home with instructions regarding wound care and activities. She is to followup with me in the office in 3 weeks.  Discharge Exam: Blood pressure 115/72, pulse 61, temperature 97.7 F (36.5 C), temperature source Oral, resp. rate 16, SpO2 98.00%.  Disposition: Home   Medication List  As of 05/16/2011  8:29 AM   TAKE these medications         cyclobenzaprine 10 MG tablet   Commonly known as: FLEXERIL   Take 1 tablet (10 mg total) by mouth 3 (three) times daily as needed for muscle spasms.      losartan 100 MG tablet   Commonly known as: COZAAR   Take 100 mg by mouth daily.      oxyCODONE-acetaminophen 5-325 MG per tablet   Commonly known as: PERCOCET   Take 1-2 tablets by mouth every 4 (four) hours as needed for pain.             Signed: Hewitt Shorts, MD 05/16/2011, 8:29 AM

## 2011-05-16 NOTE — Discharge Instructions (Signed)

## 2012-11-06 ENCOUNTER — Other Ambulatory Visit: Payer: Self-pay | Admitting: Neurosurgery

## 2012-11-06 DIAGNOSIS — M542 Cervicalgia: Secondary | ICD-10-CM

## 2012-11-11 ENCOUNTER — Ambulatory Visit
Admission: RE | Admit: 2012-11-11 | Discharge: 2012-11-11 | Disposition: A | Discharge status: Home / Self Care | Payer: Federal, State, Local not specified - PPO | Source: Ambulatory Visit | Attending: Neurosurgery | Admitting: Neurosurgery

## 2012-11-11 DIAGNOSIS — M542 Cervicalgia: Secondary | ICD-10-CM

## 2012-12-11 ENCOUNTER — Other Ambulatory Visit: Payer: Self-pay | Admitting: Neurosurgery

## 2013-01-03 ENCOUNTER — Encounter (HOSPITAL_COMMUNITY)
Admission: RE | Admit: 2013-01-03 | Discharge: 2013-01-03 | Disposition: A | Discharge status: Home / Self Care | Payer: Federal, State, Local not specified - PPO | Source: Ambulatory Visit | Attending: Neurosurgery | Admitting: Neurosurgery

## 2013-01-03 ENCOUNTER — Encounter (HOSPITAL_COMMUNITY): Payer: Self-pay | Admitting: Pharmacy Technician

## 2013-01-03 ENCOUNTER — Ambulatory Visit (HOSPITAL_COMMUNITY)
Admission: RE | Admit: 2013-01-03 | Discharge: 2013-01-03 | Disposition: A | Discharge status: Home / Self Care | Payer: Federal, State, Local not specified - PPO | Source: Ambulatory Visit | Attending: Neurosurgery | Admitting: Neurosurgery

## 2013-01-03 ENCOUNTER — Encounter (HOSPITAL_COMMUNITY): Payer: Self-pay

## 2013-01-03 DIAGNOSIS — J4489 Other specified chronic obstructive pulmonary disease: Secondary | ICD-10-CM | POA: Insufficient documentation

## 2013-01-03 DIAGNOSIS — Z01818 Encounter for other preprocedural examination: Secondary | ICD-10-CM | POA: Insufficient documentation

## 2013-01-03 DIAGNOSIS — I517 Cardiomegaly: Secondary | ICD-10-CM | POA: Insufficient documentation

## 2013-01-03 DIAGNOSIS — Z01810 Encounter for preprocedural cardiovascular examination: Secondary | ICD-10-CM | POA: Insufficient documentation

## 2013-01-03 DIAGNOSIS — Z01812 Encounter for preprocedural laboratory examination: Secondary | ICD-10-CM | POA: Insufficient documentation

## 2013-01-03 DIAGNOSIS — J449 Chronic obstructive pulmonary disease, unspecified: Secondary | ICD-10-CM | POA: Insufficient documentation

## 2013-01-03 DIAGNOSIS — I10 Essential (primary) hypertension: Secondary | ICD-10-CM | POA: Insufficient documentation

## 2013-01-03 HISTORY — DX: Pneumonia, unspecified organism: J18.9

## 2013-01-03 LAB — BASIC METABOLIC PANEL
BUN: 9 mg/dL (ref 6–23)
CO2: 29 mEq/L (ref 19–32)
Calcium: 9.7 mg/dL (ref 8.4–10.5)
Chloride: 94 mEq/L — ABNORMAL LOW (ref 96–112)
Creatinine, Ser: 0.54 mg/dL (ref 0.50–1.10)
GFR calc Af Amer: >90 mL/min (ref >90)
GFR calc non Af Amer: >90 mL/min (ref >90)
Glucose, Bld: 72 mg/dL (ref 70–99)
Potassium: 4.3 mEq/L (ref 3.5–5.1)
Sodium: 132 mEq/L — ABNORMAL LOW (ref 135–145)

## 2013-01-03 LAB — CBC
HCT: 38.2 % (ref 36.0–46.0)
Hemoglobin: 13.4 g/dL (ref 12.0–15.0)
MCH: 32.7 pg (ref 26.0–34.0)
MCHC: 35.1 g/dL (ref 30.0–36.0)
MCV: 93.2 fL (ref 78.0–100.0)
Platelets: 303 10*3/uL (ref 150–400)
RBC: 4.1 MIL/uL (ref 3.87–5.11)
RDW: 12.4 % (ref 11.5–15.5)
WBC: 6.6 10*3/uL (ref 4.0–10.5)

## 2013-01-03 LAB — SURGICAL PCR SCREEN
MRSA, PCR: POSITIVE — AB
Staphylococcus aureus: POSITIVE — AB

## 2013-01-03 NOTE — Pre-Procedure Instructions (Signed)
Heather Gutierrez  01/03/2013   Your procedure is scheduled on:  Monday January 13, 2013 @ 7:30 AM.  Report to [facility] Short Stay Entrance "A"  Admitting at 5:30 AM.  Call this number if you have problems the morning of surgery: [phone]   Remember:   Do not eat food or drink liquids after midnight.   Take these medicines the morning of surgery with A SIP OF WATER: Amlodipine (Norvasc)    Discontinue aspirin and herbal medications 7 days prior to surgery   Do not wear jewelry, make-up or nail polish.  Do not wear lotions, powders, or perfumes. You may wear deodorant.  Do not shave 48 hours prior to surgery.   Do not bring valuables to the hospital.  [facility] is not responsible for any belongings or valuables.               Contacts, dentures or bridgework may not be worn into surgery.  Leave suitcase in the car. After surgery it may be brought to your room.  For patients admitted to the hospital, discharge time is determined by your treatment team.               Patients discharged the day of surgery will not be allowed to drive home.  Name and phone number of your driver: Family/Friend  Special Instructions: Shower using CHG 2 nights before surgery and the night before surgery.  If you shower the day of surgery use CHG.  Use special wash - you have one bottle of CHG for all showers.  You should use approximately 1/3 of the bottle for each shower.   Please read over the following fact sheets that you were given: Pain Booklet, Coughing and Deep Breathing, MRSA Information and Surgical Site Infection Prevention

## 2013-01-03 NOTE — Progress Notes (Signed)
Patient informed Nurse that she had a stress test approximately 2 years ago at [facility]. Will request records. PCP is Dr. Nathanial Rancher in [place], [place].

## 2013-01-12 MED ORDER — CEFAZOLIN SODIUM-DEXTROSE 2-3 GM-% IV SOLR
2.0000 g | INTRAVENOUS | Status: AC
  Administered 2013-01-13: 2 g via INTRAVENOUS
  Filled 2013-01-12: qty 50

## 2013-01-13 ENCOUNTER — Encounter (HOSPITAL_COMMUNITY): Payer: Federal, State, Local not specified - PPO | Admitting: Anesthesiology

## 2013-01-13 ENCOUNTER — Encounter (HOSPITAL_COMMUNITY)
Admission: RE | Disposition: A | Discharge status: Home / Self Care | Payer: Self-pay | Source: Ambulatory Visit | Attending: Neurosurgery

## 2013-01-13 ENCOUNTER — Encounter (HOSPITAL_COMMUNITY): Payer: Self-pay | Admitting: Surgery

## 2013-01-13 ENCOUNTER — Ambulatory Visit (HOSPITAL_COMMUNITY)
Admission: RE | Admit: 2013-01-13 | Discharge: 2013-01-14 | Disposition: A | Discharge status: Home / Self Care | Payer: Federal, State, Local not specified - PPO | Source: Ambulatory Visit | Attending: Neurosurgery | Admitting: Neurosurgery

## 2013-01-13 ENCOUNTER — Inpatient Hospital Stay (HOSPITAL_COMMUNITY): Payer: Federal, State, Local not specified - PPO

## 2013-01-13 ENCOUNTER — Inpatient Hospital Stay (HOSPITAL_COMMUNITY): Payer: Federal, State, Local not specified - PPO | Admitting: Anesthesiology

## 2013-01-13 DIAGNOSIS — M47812 Spondylosis without myelopathy or radiculopathy, cervical region: Principal | ICD-10-CM | POA: Insufficient documentation

## 2013-01-13 DIAGNOSIS — M5030 Other cervical disc degeneration, unspecified cervical region: Secondary | ICD-10-CM | POA: Insufficient documentation

## 2013-01-13 DIAGNOSIS — S129XXA Fracture of neck, unspecified, initial encounter: Secondary | ICD-10-CM

## 2013-01-13 DIAGNOSIS — Z981 Arthrodesis status: Secondary | ICD-10-CM | POA: Insufficient documentation

## 2013-01-13 DIAGNOSIS — J4489 Other specified chronic obstructive pulmonary disease: Secondary | ICD-10-CM | POA: Insufficient documentation

## 2013-01-13 DIAGNOSIS — J449 Chronic obstructive pulmonary disease, unspecified: Secondary | ICD-10-CM | POA: Insufficient documentation

## 2013-01-13 DIAGNOSIS — Z87891 Personal history of nicotine dependence: Secondary | ICD-10-CM | POA: Insufficient documentation

## 2013-01-13 DIAGNOSIS — Z9089 Acquired absence of other organs: Secondary | ICD-10-CM | POA: Insufficient documentation

## 2013-01-13 DIAGNOSIS — I10 Essential (primary) hypertension: Secondary | ICD-10-CM | POA: Insufficient documentation

## 2013-01-13 HISTORY — PX: POSTERIOR CERVICAL FUSION/FORAMINOTOMY: SHX5038

## 2013-01-13 SURGERY — POSTERIOR CERVICAL FUSION/FORAMINOTOMY LEVEL 3
Anesthesia: General

## 2013-01-13 MED ORDER — PHENOL 1.4 % MT LIQD: 1.0000 | OROMUCOSAL | Status: DC | PRN

## 2013-01-13 MED ORDER — ZOLPIDEM TARTRATE 5 MG PO TABS: 5.0000 mg | ORAL_TABLET | Freq: Every evening | ORAL | Status: DC | PRN

## 2013-01-13 MED ORDER — 0.9 % SODIUM CHLORIDE (POUR BTL) OPTIME
TOPICAL | Status: DC | PRN
  Administered 2013-01-13: 1000 mL

## 2013-01-13 MED ORDER — AMLODIPINE BESYLATE 5 MG PO TABS
5.0000 mg | ORAL_TABLET | Freq: Every day | ORAL | Status: DC
  Filled 2013-01-13 (×2): qty 1

## 2013-01-13 MED ORDER — KETOROLAC TROMETHAMINE 30 MG/ML IJ SOLN
30.0000 mg | Freq: Four times a day (QID) | INTRAMUSCULAR | Status: DC
  Administered 2013-01-13: 30 mg via INTRAVENOUS

## 2013-01-13 MED ORDER — ACETAMINOPHEN 650 MG RE SUPP: 650.0000 mg | RECTAL | Status: DC | PRN

## 2013-01-13 MED ORDER — SODIUM CHLORIDE 0.9 % IR SOLN
Status: DC | PRN
  Administered 2013-01-13: 09:00:00

## 2013-01-13 MED ORDER — LOSARTAN POTASSIUM 50 MG PO TABS
100.0000 mg | ORAL_TABLET | Freq: Every day | ORAL | Status: DC
  Filled 2013-01-13 (×2): qty 2

## 2013-01-13 MED ORDER — VANCOMYCIN HCL IN DEXTROSE 1-5 GM/200ML-% IV SOLN
1000.0000 mg | Freq: Two times a day (BID) | INTRAVENOUS | Status: DC
  Administered 2013-01-13 – 2013-01-14 (×2): 1000 mg via INTRAVENOUS
  Filled 2013-01-13 (×4): qty 200

## 2013-01-13 MED ORDER — CYCLOBENZAPRINE HCL 10 MG PO TABS
ORAL_TABLET | ORAL | Status: AC
  Filled 2013-01-13: qty 1

## 2013-01-13 MED ORDER — THROMBIN 20000 UNITS EX SOLR
CUTANEOUS | Status: DC | PRN
  Administered 2013-01-13: 09:00:00 via TOPICAL

## 2013-01-13 MED ORDER — KETOROLAC TROMETHAMINE 30 MG/ML IJ SOLN: 30.0000 mg | Freq: Once | INTRAMUSCULAR | Status: DC

## 2013-01-13 MED ORDER — GLYCOPYRROLATE 0.2 MG/ML IJ SOLN
INTRAMUSCULAR | Status: DC | PRN
  Administered 2013-01-13: 0.6 mg via INTRAVENOUS

## 2013-01-13 MED ORDER — CHLORHEXIDINE GLUCONATE CLOTH 2 % EX PADS
6.0000 | MEDICATED_PAD | Freq: Every day | CUTANEOUS | Status: DC
  Administered 2013-01-14: 6 via TOPICAL

## 2013-01-13 MED ORDER — HYDROMORPHONE HCL PF 1 MG/ML IJ SOLN
INTRAMUSCULAR | Status: AC
  Filled 2013-01-13: qty 1

## 2013-01-13 MED ORDER — MAGNESIUM HYDROXIDE 400 MG/5ML PO SUSP: 30.0000 mL | Freq: Every day | ORAL | Status: DC | PRN

## 2013-01-13 MED ORDER — BISACODYL 10 MG RE SUPP: 10.0000 mg | Freq: Every day | RECTAL | Status: DC | PRN

## 2013-01-13 MED ORDER — VANCOMYCIN HCL 1000 MG IV SOLR
1000.0000 mg | Freq: Two times a day (BID) | INTRAVENOUS | Status: DC
  Administered 2013-01-13: 1000 mg via INTRAVENOUS
  Filled 2013-01-13 (×2): qty 1000

## 2013-01-13 MED ORDER — NEOSTIGMINE METHYLSULFATE 1 MG/ML IJ SOLN
INTRAMUSCULAR | Status: DC | PRN
  Administered 2013-01-13: 4 mg via INTRAVENOUS

## 2013-01-13 MED ORDER — ALUM & MAG HYDROXIDE-SIMETH 200-200-20 MG/5ML PO SUSP: 30.0000 mL | Freq: Four times a day (QID) | ORAL | Status: DC | PRN

## 2013-01-13 MED ORDER — LACTATED RINGERS IV SOLN
INTRAVENOUS | Status: DC | PRN
  Administered 2013-01-13 (×2): via INTRAVENOUS

## 2013-01-13 MED ORDER — HYDROCODONE-ACETAMINOPHEN 5-325 MG PO TABS
ORAL_TABLET | ORAL | Status: AC
  Filled 2013-01-13: qty 2

## 2013-01-13 MED ORDER — FENTANYL CITRATE 0.05 MG/ML IJ SOLN
INTRAMUSCULAR | Status: DC | PRN
  Administered 2013-01-13: 50 ug via INTRAVENOUS
  Administered 2013-01-13: 100 ug via INTRAVENOUS
  Administered 2013-01-13 (×4): 50 ug via INTRAVENOUS

## 2013-01-13 MED ORDER — KETOROLAC TROMETHAMINE 30 MG/ML IJ SOLN
30.0000 mg | Freq: Four times a day (QID) | INTRAMUSCULAR | Status: DC
  Administered 2013-01-13 – 2013-01-14 (×5): 30 mg via INTRAVENOUS
  Filled 2013-01-13 (×7): qty 1

## 2013-01-13 MED ORDER — BACITRACIN ZINC 500 UNIT/GM EX OINT
TOPICAL_OINTMENT | CUTANEOUS | Status: DC | PRN
  Administered 2013-01-13: 1 via TOPICAL

## 2013-01-13 MED ORDER — LIDOCAINE-EPINEPHRINE 1 %-1:100000 IJ SOLN
INTRAMUSCULAR | Status: DC | PRN
  Administered 2013-01-13: 13.5 mL

## 2013-01-13 MED ORDER — PROPOFOL 10 MG/ML IV BOLUS
INTRAVENOUS | Status: DC | PRN
  Administered 2013-01-13: 25 mg via INTRAVENOUS
  Administered 2013-01-13: 175 mg via INTRAVENOUS

## 2013-01-13 MED ORDER — KETOROLAC TROMETHAMINE 30 MG/ML IJ SOLN
INTRAMUSCULAR | Status: AC
  Filled 2013-01-13: qty 1

## 2013-01-13 MED ORDER — SODIUM CHLORIDE 0.9 % IJ SOLN
3.0000 mL | Freq: Two times a day (BID) | INTRAMUSCULAR | Status: DC
  Administered 2013-01-13 – 2013-01-14 (×3): 3 mL via INTRAVENOUS

## 2013-01-13 MED ORDER — ACETAMINOPHEN 325 MG PO TABS: 650.0000 mg | ORAL_TABLET | ORAL | Status: DC | PRN

## 2013-01-13 MED ORDER — LIDOCAINE HCL 4 % MT SOLN
OROMUCOSAL | Status: DC | PRN
  Administered 2013-01-13: 2 mL via TOPICAL

## 2013-01-13 MED ORDER — ONDANSETRON HCL 4 MG/2ML IJ SOLN: 4.0000 mg | Freq: Four times a day (QID) | INTRAMUSCULAR | Status: DC | PRN

## 2013-01-13 MED ORDER — PHENYLEPHRINE HCL 10 MG/ML IJ SOLN
INTRAMUSCULAR | Status: DC | PRN
  Administered 2013-01-13: 120 ug via INTRAVENOUS
  Administered 2013-01-13 (×2): 80 ug via INTRAVENOUS

## 2013-01-13 MED ORDER — DEXTROSE 5 % IV SOLN
10.0000 mg | INTRAVENOUS | Status: DC | PRN
  Administered 2013-01-13: 25 ug/min via INTRAVENOUS

## 2013-01-13 MED ORDER — ROCURONIUM BROMIDE 100 MG/10ML IV SOLN
INTRAVENOUS | Status: DC | PRN
  Administered 2013-01-13: 10 mg via INTRAVENOUS
  Administered 2013-01-13: 40 mg via INTRAVENOUS

## 2013-01-13 MED ORDER — VECURONIUM BROMIDE 10 MG IV SOLR
INTRAVENOUS | Status: DC | PRN
  Administered 2013-01-13: 3 mg via INTRAVENOUS
  Administered 2013-01-13 (×3): 2 mg via INTRAVENOUS

## 2013-01-13 MED ORDER — CYCLOBENZAPRINE HCL 10 MG PO TABS
10.0000 mg | ORAL_TABLET | Freq: Three times a day (TID) | ORAL | Status: DC | PRN
  Administered 2013-01-13 – 2013-01-14 (×3): 10 mg via ORAL
  Filled 2013-01-13 (×2): qty 1

## 2013-01-13 MED ORDER — MORPHINE SULFATE 4 MG/ML IJ SOLN
4.0000 mg | INTRAMUSCULAR | Status: DC | PRN
  Administered 2013-01-13 – 2013-01-14 (×6): 4 mg via INTRAMUSCULAR
  Filled 2013-01-13 (×6): qty 1

## 2013-01-13 MED ORDER — BUPIVACAINE HCL (PF) 0.5 % IJ SOLN
INTRAMUSCULAR | Status: DC | PRN
  Administered 2013-01-13: 13.5 mL

## 2013-01-13 MED ORDER — HYDROXYZINE HCL 25 MG PO TABS: 50.0000 mg | ORAL_TABLET | ORAL | Status: DC | PRN

## 2013-01-13 MED ORDER — ONDANSETRON HCL 4 MG/2ML IJ SOLN
INTRAMUSCULAR | Status: DC | PRN
  Administered 2013-01-13: 4 mg via INTRAVENOUS

## 2013-01-13 MED ORDER — DIAZEPAM 5 MG/ML IJ SOLN
5.0000 mg | Freq: Once | INTRAMUSCULAR | Status: AC
  Administered 2013-01-13: 5 mg via INTRAVENOUS

## 2013-01-13 MED ORDER — DEXMEDETOMIDINE HCL IN NACL 200 MCG/50ML IV SOLN
0.4000 ug/kg/h | INTRAVENOUS | Status: DC
  Filled 2013-01-13: qty 50

## 2013-01-13 MED ORDER — ACETAMINOPHEN 10 MG/ML IV SOLN
INTRAVENOUS | Status: AC
  Administered 2013-01-13: 1000 mg via INTRAVENOUS
  Filled 2013-01-13: qty 100

## 2013-01-13 MED ORDER — ONDANSETRON HCL 4 MG/2ML IJ SOLN: 4.0000 mg | Freq: Once | INTRAMUSCULAR | Status: DC | PRN

## 2013-01-13 MED ORDER — HYDROMORPHONE HCL PF 1 MG/ML IJ SOLN
0.2500 mg | INTRAMUSCULAR | Status: DC | PRN
  Administered 2013-01-13 (×4): 0.5 mg via INTRAVENOUS

## 2013-01-13 MED ORDER — VANCOMYCIN HCL IN DEXTROSE 1-5 GM/200ML-% IV SOLN
INTRAVENOUS | Status: AC
  Filled 2013-01-13: qty 200

## 2013-01-13 MED ORDER — MENTHOL 3 MG MT LOZG
1.0000 | LOZENGE | OROMUCOSAL | Status: DC | PRN
  Filled 2013-01-13: qty 9

## 2013-01-13 MED ORDER — LIDOCAINE HCL (CARDIAC) 20 MG/ML IV SOLN
INTRAVENOUS | Status: DC | PRN
  Administered 2013-01-13: 40 mg via INTRAVENOUS

## 2013-01-13 MED ORDER — ARTIFICIAL TEARS OP OINT
TOPICAL_OINTMENT | OPHTHALMIC | Status: DC | PRN
  Administered 2013-01-13: 1 via OPHTHALMIC

## 2013-01-13 MED ORDER — OXYCODONE-ACETAMINOPHEN 5-325 MG PO TABS
1.0000 | ORAL_TABLET | ORAL | Status: DC | PRN
  Administered 2013-01-13 – 2013-01-14 (×5): 2 via ORAL
  Filled 2013-01-13 (×5): qty 2

## 2013-01-13 MED ORDER — MIDAZOLAM HCL 2 MG/2ML IJ SOLN
1.0000 mg | Freq: Once | INTRAMUSCULAR | Status: AC
  Administered 2013-01-13: 1 mg via INTRAVENOUS

## 2013-01-13 MED ORDER — MIDAZOLAM HCL 2 MG/2ML IJ SOLN
INTRAMUSCULAR | Status: AC
  Filled 2013-01-13: qty 2

## 2013-01-13 MED ORDER — DIAZEPAM 5 MG/ML IJ SOLN
INTRAMUSCULAR | Status: AC
  Filled 2013-01-13: qty 2

## 2013-01-13 MED ORDER — POTASSIUM CHLORIDE IN NACL 20-0.9 MEQ/L-% IV SOLN
INTRAVENOUS | Status: DC
  Filled 2013-01-13 (×5): qty 1000

## 2013-01-13 MED ORDER — MIDAZOLAM HCL 5 MG/5ML IJ SOLN
INTRAMUSCULAR | Status: DC | PRN
  Administered 2013-01-13 (×2): 1 mg via INTRAVENOUS

## 2013-01-13 MED ORDER — SODIUM CHLORIDE 0.9 % IJ SOLN: 3.0000 mL | INTRAMUSCULAR | Status: DC | PRN

## 2013-01-13 MED ORDER — HYDROCODONE-ACETAMINOPHEN 5-325 MG PO TABS
1.0000 | ORAL_TABLET | ORAL | Status: DC | PRN
  Administered 2013-01-13: 2 via ORAL

## 2013-01-13 SURGICAL SUPPLY — 73 items
2.5 MM DRILL BIT ×2 IMPLANT
BAG DECANTER FOR FLEXI CONT (MISCELLANEOUS) ×2 IMPLANT
BIT DRILL NEURO 2X3.1 SFT TUCH (MISCELLANEOUS) ×1 IMPLANT
BIT DRILL WIRE PASS 1.3MM (BIT) IMPLANT
BLADE SURG 11 STRL SS (BLADE) ×2 IMPLANT
BLADE SURG ROTATE 9660 (MISCELLANEOUS) ×2 IMPLANT
BLOCKER OASYS (Neuro Prosthesis/Implant) ×16 IMPLANT
BRUSH SCRUB EZ PLAIN DRY (MISCELLANEOUS) ×2 IMPLANT
CABLE SNG STERILE W/CRIMP (Neuro Prosthesis/Implant) ×2 IMPLANT
CANISTER SUCT 3000ML (MISCELLANEOUS) ×2 IMPLANT
CONT SPEC 4OZ CLIKSEAL STRL BL (MISCELLANEOUS) ×2 IMPLANT
COVER TABLE BACK 60X90 (DRAPES) ×2 IMPLANT
DECANTER SPIKE VIAL GLASS SM (MISCELLANEOUS) ×2 IMPLANT
DERMABOND ADHESIVE PROPEN (GAUZE/BANDAGES/DRESSINGS) ×2
DERMABOND ADVANCED (GAUZE/BANDAGES/DRESSINGS) ×1
DERMABOND ADVANCED .7 DNX12 (GAUZE/BANDAGES/DRESSINGS) ×1 IMPLANT
DERMABOND ADVANCED .7 DNX6 (GAUZE/BANDAGES/DRESSINGS) ×2 IMPLANT
DRAPE C-ARM 42X72 X-RAY (DRAPES) ×4 IMPLANT
DRAPE LAPAROTOMY 100X72 PEDS (DRAPES) ×2 IMPLANT
DRAPE POUCH INSTRU U-SHP 10X18 (DRAPES) ×2 IMPLANT
DRAPE PROXIMA HALF (DRAPES) IMPLANT
DRILL NEURO 2X3.1 SOFT TOUCH (MISCELLANEOUS) ×2
DRILL OASYS 2.5MM (BIT) ×1 IMPLANT
DRILL WIRE PASS 1.3MM (BIT)
DRIUS OASYS 2.5MM (BIT) ×2
DRSG EMULSION OIL 3X3 NADH (GAUZE/BANDAGES/DRESSINGS) IMPLANT
ELECT REM PT RETURN 9FT ADLT (ELECTROSURGICAL) ×2
ELECTRODE REM PT RTRN 9FT ADLT (ELECTROSURGICAL) ×1 IMPLANT
EVACUATOR 1/8 PVC DRAIN (DRAIN) IMPLANT
GAUZE SPONGE 4X4 16PLY XRAY LF (GAUZE/BANDAGES/DRESSINGS) IMPLANT
GLOVE BIO SURGEON STRL SZ8 (GLOVE) ×2 IMPLANT
GLOVE BIOGEL PI IND STRL 7.5 (GLOVE) ×2 IMPLANT
GLOVE BIOGEL PI IND STRL 8 (GLOVE) ×1 IMPLANT
GLOVE BIOGEL PI IND STRL 8.5 (GLOVE) ×1 IMPLANT
GLOVE BIOGEL PI INDICATOR 7.5 (GLOVE) ×2
GLOVE BIOGEL PI INDICATOR 8 (GLOVE) ×1
GLOVE BIOGEL PI INDICATOR 8.5 (GLOVE) ×1
GLOVE ECLIPSE 7.5 STRL STRAW (GLOVE) ×8 IMPLANT
GLOVE EXAM NITRILE LRG STRL (GLOVE) ×4 IMPLANT
GLOVE EXAM NITRILE MD LF STRL (GLOVE) IMPLANT
GLOVE EXAM NITRILE XL STR (GLOVE) IMPLANT
GLOVE EXAM NITRILE XS STR PU (GLOVE) IMPLANT
GOWN BRE IMP SLV AUR LG STRL (GOWN DISPOSABLE) ×2 IMPLANT
GOWN BRE IMP SLV AUR XL STRL (GOWN DISPOSABLE) ×2 IMPLANT
GOWN STRL REIN 2XL LVL4 (GOWN DISPOSABLE) ×2 IMPLANT
HEMOSTAT SURGICEL 2X14 (HEMOSTASIS) IMPLANT
KIT BASIN OR (CUSTOM PROCEDURE TRAY) ×2 IMPLANT
KIT INFUSE X SMALL 1.4CC (Orthopedic Implant) ×2 IMPLANT
KIT ROOM TURNOVER OR (KITS) ×2 IMPLANT
NEEDLE SPNL 18GX3.5 QUINCKE PK (NEEDLE) ×2 IMPLANT
NEEDLE SPNL 22GX3.5 QUINCKE BK (NEEDLE) ×4 IMPLANT
NS IRRIG 1000ML POUR BTL (IV SOLUTION) ×2 IMPLANT
PACK LAMINECTOMY NEURO (CUSTOM PROCEDURE TRAY) ×2 IMPLANT
PAD ARMBOARD 7.5X6 YLW CONV (MISCELLANEOUS) ×6 IMPLANT
PIN MAYFIELD SKULL DISP (PIN) ×2 IMPLANT
ROD OASYS 3.5X60MM (Rod) ×4 IMPLANT
SCREW BIASED ANGLE 3.5X14 (Screw) ×16 IMPLANT
SPONGE GAUZE 4X4 12PLY (GAUZE/BANDAGES/DRESSINGS) ×2 IMPLANT
SPONGE LAP 4X18 X RAY DECT (DISPOSABLE) IMPLANT
SPONGE SURGIFOAM ABS GEL 100 (HEMOSTASIS) ×2 IMPLANT
STAPLER SKIN PROX WIDE 3.9 (STAPLE) ×2 IMPLANT
STRIP BIOACTIVE VITOSS 25X52X4 (Orthopedic Implant) ×2 IMPLANT
SUT ETHILON 3 0 FSL (SUTURE) IMPLANT
SUT VIC AB 0 CT1 18XCR BRD8 (SUTURE) ×2 IMPLANT
SUT VIC AB 0 CT1 8-18 (SUTURE) ×2
SUT VIC AB 2-0 CP2 18 (SUTURE) ×4 IMPLANT
SYR 20ML ECCENTRIC (SYRINGE) ×2 IMPLANT
TAP 3.5MM (TAP) ×2 IMPLANT
TOWEL OR 17X24 6PK STRL BLUE (TOWEL DISPOSABLE) ×2 IMPLANT
TOWEL OR 17X26 10 PK STRL BLUE (TOWEL DISPOSABLE) ×2 IMPLANT
TRAY FOLEY CATH 14FRSI W/METER (CATHETERS) IMPLANT
UNDERPAD 30X30 INCONTINENT (UNDERPADS AND DIAPERS) ×2 IMPLANT
WATER STERILE IRR 1000ML POUR (IV SOLUTION) ×2 IMPLANT

## 2013-01-13 NOTE — Anesthesia Preprocedure Evaluation (Signed)
Anesthesia Evaluation  Patient identified by MRN, date of birth, ID band Patient awake    Reviewed: Allergy & Precautions, H&P , NPO status , Patient's Chart, lab work & pertinent test results  Airway Mallampati: II TM Distance: >3 FB Neck ROM: Limited    Dental  (+) Partial Upper, Edentulous Lower and Dental Advisory Given   Pulmonary former smoker,  breath sounds clear to auscultation        Cardiovascular hypertension, Rhythm:Regular Rate:Normal     Neuro/Psych    GI/Hepatic   Endo/Other    Renal/GU      Musculoskeletal   Abdominal   Peds  Hematology   Anesthesia Other Findings   Reproductive/Obstetrics                           Anesthesia Physical Anesthesia Plan  ASA: III  Anesthesia Plan: General   Post-op Pain Management:    Induction: Intravenous  Airway Management Planned: Oral ETT  Additional Equipment:   Intra-op Plan:   Post-operative Plan:   Informed Consent: I have reviewed the patients History and Physical, chart, labs and discussed the procedure including the risks, benefits and alternatives for the proposed anesthesia with the patient or authorized representative who has indicated his/her understanding and acceptance.   Dental advisory given  Plan Discussed with: CRNA and Anesthesiologist  Anesthesia Plan Comments:         Anesthesia Quick Evaluation

## 2013-01-13 NOTE — Progress Notes (Signed)
Dr. Noreene Larsson at bedside and precedex administered

## 2013-01-13 NOTE — Progress Notes (Signed)
Dr. Jule Ser at bedside, aware of meds pt has received and still in pain, order for Valium recieved

## 2013-01-13 NOTE — Progress Notes (Signed)
Dr. Jule Ser and Dr Noreene Larsson aware of pain still

## 2013-01-13 NOTE — Plan of Care (Signed)
Problem: Consults Goal: Diagnosis - Spinal Surgery Outcome: Completed/Met Date Met:  01/13/13 Cervical Spine Fusion     

## 2013-01-13 NOTE — Transfer of Care (Signed)
Immediate Anesthesia Transfer of Care Note  Patient: Heather Gutierrez  Procedure(s) Performed: Procedure(s) with comments: CERVICAL FOUR TO SEVEN POSTERIOR CERVICAL FUSION/FORAMINOTOMY LEVEL 3 (N/A) - C4-7 posterior cervical arthrodesis with instrumentation  Patient Location: PACU  Anesthesia Type:General  Level of Consciousness: awake, alert  and oriented  Airway & Oxygen Therapy: Patient Spontanous Breathing and Patient connected to nasal cannula oxygen  Post-op Assessment: Report given to PACU RN, Post -op Vital signs reviewed and stable and Patient moving all extremities X 4  Post vital signs: Reviewed and stable  Complications: No apparent anesthesia complications

## 2013-01-13 NOTE — H&P (Signed)
Subjective: Patient is a 47 y.o. 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  Patient is status post a C4-5 ACDF April 2013, status post a C6 ACDF in September 2005, and status post a C5-6 ACDF in March of 1998. She's been evaluated with both CT and MRI scan of the cervical spine. CT shows nonunion and pseudoarthrosis at C4-5 worse than at C6-7, with good fusion at C5-6. MRI scan shows no significant central canal encroachment, there are some arthritic changes, certainly at the C3-4 level, with some residual changes at adjacent levels. Patient is admitted now for C4-C7 posterior cervical arthrodesis with lateral mass screws, rods, and bone graft.    Past Medical History  Diagnosis Date  . Hypertension   . Neck pain   . COPD (chronic obstructive pulmonary disease)   . Headache(784.0)   . Pneumonia     hx of    Past Surgical History  Procedure Laterality Date  . Appendectomy    . Cervical fusion      X 2  . Anterior cervical decomp/discectomy fusion  05/15/2011    Procedure: ANTERIOR CERVICAL DECOMPRESSION/DISCECTOMY FUSION 1 LEVEL;  Surgeon: Hewitt Shorts, MD;  Location: [facility];  Service: Neurosurgery;  Laterality: N/A;  Cervical four-five anterior cervical decompression with fusion plating and bonegraft    Prescriptions prior to admission  Medication Sig Dispense Refill  . amLODipine (NORVASC) 5 MG tablet Take 5 mg by mouth daily.      Marland Kitchen ibuprofen (ADVIL,MOTRIN) 200 MG tablet Take 600 mg by mouth every 4 (four) hours as needed for moderate pain (neck).      . losartan (COZAAR) 100 MG tablet Take 100 mg by mouth daily.       No Known Allergies  History  Substance Use Topics  . Smoking status: Former Smoker -- 1.00 packs/day for 33 years    Quit date: 04/04/2011  . Smokeless tobacco: Not on file  . Alcohol Use: 0.0 oz/week    4-5 Cans of beer per week     Comment: Daily    History reviewed. No  pertinent family history.   Review of Systems A comprehensive review of systems was negative.  Objective: Vital signs in last 24 hours: Pulse Rate:  [96] 96 (12/22 0615) Resp:  [18] 18 (12/22 0615) BP: (144)/(83) 144/83 mmHg (12/22 0615) SpO2:  [100 %] 100 % (12/22 0615)  EXAM: Patient is a well-developed well-nourished white female, in no acute distress. Lungs are clear to auscultation , the patient has symmetrical respiratory excursion. Heart has a regular rate and rhythm normal S1 and S2 no murmur.   Abdomen is soft nontender nondistended bowel sounds are present. Extremity examination shows no clubbing cyanosis or edema. Musculoskeletal examination shows fairly good range of motion neck through flexion, extension, and lateral flexion to either side. Motor examination shows 5 over 5 strength in the upper extremities including the deltoid biceps triceps and intrinsics and grip. Sensation is intact to pinprick throughout the digits of the upper extremities. Reflexes are symmetrical and without evidence of pathologic reflexes. Patient has a normal gait and stance.   Data Review:CBC    Component Value Date/Time   WBC 6.6 01/03/2013 1540   RBC 4.10 01/03/2013 1540   HGB 13.4 01/03/2013 1540   HCT 38.2 01/03/2013 1540   PLT 303 01/03/2013 1540   MCV 93.2 01/03/2013 1540   MCH 32.7 01/03/2013 1540   MCHC 35.1  01/03/2013 1540   RDW 12.4 01/03/2013 1540                          BMET    Component Value Date/Time   NA 132* 01/03/2013 1540   K 4.3 01/03/2013 1540   CL 94* 01/03/2013 1540   CO2 29 01/03/2013 1540   GLUCOSE 72 01/03/2013 1540   BUN 9 01/03/2013 1540   CREATININE 0.54 01/03/2013 1540   CALCIUM 9.7 01/03/2013 1540   GFRNONAA >90 01/03/2013 1540   GFRAA >90 01/03/2013 1540     Assessment/Plan: Patient with posterior neck pain, and 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  I've discussed with the  patient the nature of his condition, the nature the surgical procedure, the typical length of surgery, hospital stay, and overall recuperation. We discussed limitations postoperatively. I discussed risks of surgery including risks of infection, bleeding, possibly need for transfusion, the risk of nerve root dysfunction with pain, weakness, numbness, or paresthesias, the risk of spinal cord dysfunction with paralysis of all 4 limbs and quadriplegia, and the risk of dural tear and CSF leakage and possible need for further surgery, the risk of failure of the arthrodesis and the possible need for further surgery, and the risk of anesthetic complications including myocardial infarction, stroke, pneumonia, and death. We also discussed the need for postoperative immobilization in a cervical collar. Understanding all this the patient does wish to proceed with surgery and is admitted for such.    Hewitt Shorts, MD 01/13/2013 6:47 AM

## 2013-01-13 NOTE — Preoperative (Signed)
Beta Blockers   Reason not to administer Beta Blockers:Not Applicable 

## 2013-01-13 NOTE — Anesthesia Procedure Notes (Signed)
Procedure Name: Intubation Date/Time: 01/13/2013 7:36 AM Performed by: Gayla Medicus Pre-anesthesia Checklist: Patient identified, Patient being monitored, Emergency Drugs available, Timeout performed and Suction available Patient Re-evaluated:Patient Re-evaluated prior to inductionOxygen Delivery Method: Circle system utilized Preoxygenation: Pre-oxygenation with 100% oxygen Intubation Type: IV induction Ventilation: Mask ventilation without difficulty and Oral airway inserted - appropriate to patient size Laryngoscope Size: Mac and 3 Grade View: Grade I Tube type: Oral Tube size: 7.5 mm Number of attempts: 1 Airway Equipment and Method: Stylet and Video-laryngoscopy Placement Confirmation: ETT inserted through vocal cords under direct vision,  positive ETCO2 and breath sounds checked- equal and bilateral Secured at: 21 cm Tube secured with: Tape Dental Injury: Teeth and Oropharynx as per pre-operative assessment

## 2013-01-13 NOTE — Op Note (Addendum)
01/13/2013  10:55 AM  PATIENT:  Heather Gutierrez  47 y.o. 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  ANESTHESIA:   general  EBL:  Total I/O In: 1500 [I.V.:1500] Out: 500 [Urine:400; Blood:100]  BLOOD ADMINISTERED:none  COUNT: Correct per nursing staff  DICTATION: Patient brought the operating room, placed under general endotracheal anesthesia. Radiolucent 3 pin Mayfield head holder was applied, and the patient was turned to a prone position. The neck and upper back were prepped with Betadine soap and solution, and draped in a sterile fashion. The midline was infiltrated with local anesthetic with epinephrine, and a midline incision was made, carried down through the subcutaneous tissue. Bipolar cautery "used to maintain hemostasis. Dissection was carried down to the posterior cervical fascia which was incised bilaterally, and the paracervical musculature was dissected from the spinous processes and lamina in a subperiosteal fashion. The C-arm fluoroscope was draped and brought in the field for localization as well as placing of the lateral mass screws. The C4, C5, C6, and C7 spinous processes, lamina, and lateral masses were identified. We identified entry points in the lateral masses bilaterally. Pedicle holes were started with a wire passing drill, and then the screw was drilled by hand and a superior, lateral, ventral trajectory. Each screw was examined  with the ball probe, good bony surfaces were found. Posterior cortex was tapped, and we placed 3.5 x 14 mm Oasys screws bilaterally at each level (C4, C5, C6, and C7). We decorticated the facet joints surfaces at C4-5 and C6-7 bilaterally, and packed the facet joints with a combination of infuse and Vitoss BA. We then placed a C4-5 interspinous Songer cable. Using a towel clip we made a hole in a horizontal fashion across the spinous process C4, and then pass a Songer cable around the spinous process of C5. Using the tightening device, the cable was tightened down, the cap was crimped, and then the cable was cut above the crimp. We then contoured 60 mm rods using a rod bender, they were placed within the screw heads, and then locking caps were placed. Once all the locking caps were in place final tightening was performed against a counter torque. We then decorticated the lamina of C4, C5, C6, and C7, and then a combination of infuse and Vitoss BA was packed over the lamina from C4-7. Then proceeded with closure. Deep fascia was closed with interrupted undyed 0 Vicryl, Scarpa's fascia closed with interrupted undyed 0 Vicryl sutures, and the subcutaneous subcuticular closed with interrupted inverted 2-0 Vicryl sutures. The skin is approximate Dermabond. A dressing of sterile gauze and Hypafix was applied. Closure the patient was turned back in supine position, the 3 pin Mayfield head holder was removed, and the patient was reversed an anesthetic, extubated, and transferred to return for further care where she was noted to be moving all 4 extremities to command.  PLAN OF CARE: Admit for overnight observation  PATIENT DISPOSITION:  PACU - hemodynamically stable.   Delay start of Pharmacological VTE agent (>24hrs) due to surgical blood loss or risk of bleeding:  yes

## 2013-01-13 NOTE — Progress Notes (Signed)
Filed Vitals:   01/13/13 1205 01/13/13 1215 01/13/13 1302 01/13/13 1654  BP: 86/45 93/51 119/75 109/72  Pulse: 73 70 83 83  Temp:  98 F (36.7 C) 97.6 F (36.4 C) 97.7 F (36.5 C)  TempSrc:      Resp: 18 18 18 18   Weight:      SpO2: 99% 99% 94% 99%    Patient resting in bed, much more comfortable than initially in recovery room, has been up and out of bed ambulating in halls. Foley DC'd about one hour ago, no void yet. Dressing clean and dry. Moving all extremities well.  Plan: Continued to progress thru postoperative recovery.  Hewitt Shorts, MD 01/13/2013, 5:49 PM

## 2013-01-13 NOTE — Progress Notes (Signed)
Pt. Resting and snoring at this time

## 2013-01-13 NOTE — Anesthesia Postprocedure Evaluation (Signed)
  Anesthesia Post-op Note  Patient: ODYSSEY VASBINDER  Procedure(s) Performed: Procedure(s) with comments: CERVICAL FOUR TO SEVEN POSTERIOR CERVICAL FUSION/FORAMINOTOMY LEVEL 3 (N/A) - C4-7 posterior cervical arthrodesis with instrumentation  Patient Location: PACU  Anesthesia Type:General  Level of Consciousness: awake, alert  and oriented  Airway and Oxygen Therapy: Patient Spontanous Breathing and Patient connected to nasal cannula oxygen  Post-op Pain: mild  Post-op Assessment: Post-op Vital signs reviewed, Patient's Cardiovascular Status Stable, Respiratory Function Stable, Patent Airway and Pain level controlled  Post-op Vital Signs: stable  Complications: No apparent anesthesia complications

## 2013-01-14 MED ORDER — CYCLOBENZAPRINE HCL 10 MG PO TABS: 10.0000 mg | ORAL_TABLET | Freq: Three times a day (TID) | ORAL | Status: AC | PRN

## 2013-01-14 MED ORDER — OXYCODONE-ACETAMINOPHEN 5-325 MG PO TABS: 1.0000 | ORAL_TABLET | ORAL | Status: DC | PRN

## 2013-01-14 NOTE — Progress Notes (Signed)
Pt. Alert and oriented,follows simple instructions, denies pain. Incision area without swelling, redness or S/S of infection. Voiding adequate clear yellow urine. Moving all extremities well and vitals stable and documented. Patient discharged home with sister.Posterior cervical surgery notes instructions given to patient and family member for home safety and precautions. Pt. and family stated understanding of instructions given. Pain medication given to patient prior to discharged. Extra dressing supplies given to patient for home use.

## 2013-01-14 NOTE — Discharge Summary (Signed)
Physician Discharge Summary  Patient ID: Heather Gutierrez MRN: [id number] DOB/AGE: November 30, 1965 47 y.o.  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 Postoperatively she has done well. Moderate incisional pain. She's been up and ambulating in the halls. She had mild drainage into her dressing, it was changed by the nursing staff. She is asking to be discharged to home. She's been given instructions regarding wound care and activities. She's been given supplies for continued dressing changes for the next several days. She is to return for followup with me in 3 weeks.  Discharge Exam: Blood pressure 134/81, pulse 83, temperature 98.2 F (36.8 C), temperature source Oral, resp. rate 20, weight 61 kg (134 lb 7.7 oz), SpO2 90.00%.  Disposition: Home     Medication List         amLODipine 5 MG tablet  Commonly known as:  NORVASC  Take 5 mg by mouth daily.     cyclobenzaprine 10 MG tablet  Commonly known as:  FLEXERIL  Take 1 tablet (10 mg total) by mouth 3 (three) times daily as needed for muscle spasms.     ibuprofen 200 MG tablet  Commonly known as:  ADVIL,MOTRIN  Take 600 mg by mouth every 4 (four) hours as needed for moderate pain (neck).     losartan 100 MG tablet  Commonly known as:  COZAAR  Take 100 mg by mouth daily.     oxyCODONE-acetaminophen 5-325 MG per tablet  Commonly known as:  PERCOCET/ROXICET  Take 1-2 tablets by mouth every 4 (four) hours as needed for moderate pain or severe pain.           Follow-up Information   Follow up with  Hewitt Shorts, MD.   Specialty:  Neurosurgery   Contact information:   [address] [place] [address] [phone]       Signed: Hewitt Shorts, MD 01/14/2013, 6:17 PM

## 2013-01-21 ENCOUNTER — Encounter (HOSPITAL_COMMUNITY): Payer: Self-pay | Admitting: Neurosurgery

## 2013-03-25 IMAGING — CR DG CERVICAL SPINE 2 OR 3 VIEWS
1 series · 1 of 1 positions shown · non-contrast
Comparison: Cervical spine radiographs - 05/02/2011

CLINICAL DATA: Intraoperative cervical spine localization
radiographs (ACDF C4 - C5)

CERVICAL SPINE - 2-3 VIEW

[view not recorded]
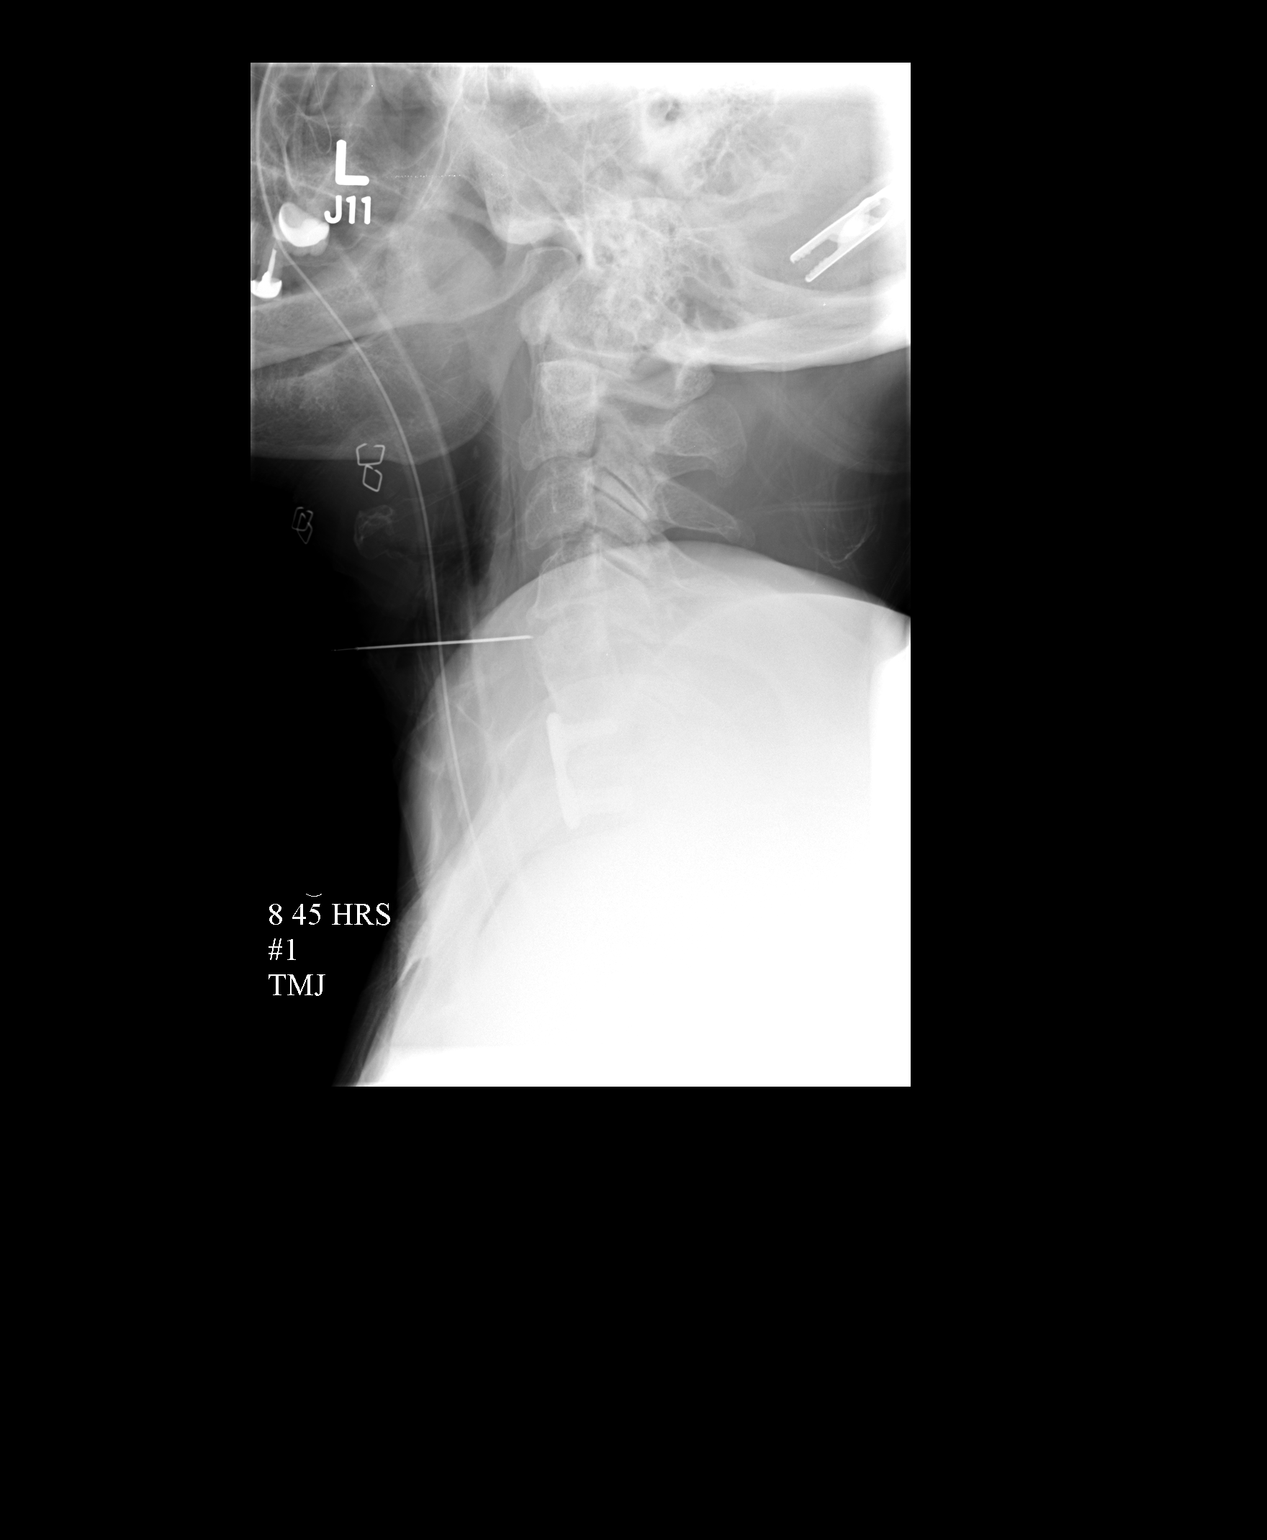

[1 of 1 positions shown; findings below may reference images not displayed]

FINDINGS: Two lateral intraoperative radiographs of the cervical spine are
provided for review.

Radiograph labeled #1 demonstrates a marking needle tip overlying
the anterior aspect of the C4 - C5 intervertebral disc space.  An
endotracheal tube overlies tracheal air column, tip excluded from
view.  Stable sequela of the C6 - C7 ACDF and bony incorporation of
C5 - C6, incompletely evaluated.  There is expected minimal amount
of subcutaneous air within the prevertebral soft tissues.

Radiograph labeled #2 demonstrates interval ACDF of C4 - C5.
Alignment appears near anatomic with restoration of the C4 - C5
intervertebral disc space height.  There is expected thickening
within the prevertebral soft tissues.  A radiopaque surgical sponge
overlies the prevertebral soft tissues anterior to the operative
site.
IMPRESSION: Intraoperative cervical spine localization as above.

## 2013-12-30 ENCOUNTER — Other Ambulatory Visit: Payer: Self-pay | Admitting: Neurosurgery

## 2013-12-30 DIAGNOSIS — S129XXA Fracture of neck, unspecified, initial encounter: Secondary | ICD-10-CM

## 2014-01-29 ENCOUNTER — Ambulatory Visit
Admission: RE | Admit: 2014-01-29 | Discharge: 2014-01-29 | Disposition: A | Discharge status: Home / Self Care | Payer: Federal, State, Local not specified - PPO | Source: Ambulatory Visit | Attending: Neurosurgery | Admitting: Neurosurgery

## 2014-01-29 DIAGNOSIS — S129XXA Fracture of neck, unspecified, initial encounter: Secondary | ICD-10-CM

## 2014-11-14 IMAGING — CR DG CHEST 2V
2 series · 2 of 2 positions shown · non-contrast
Comparison: 04/17/2011

CLINICAL DATA: Preoperative evaluation, history hypertension, COPD

EXAM:
CHEST  2 VIEW

[w chest pa]
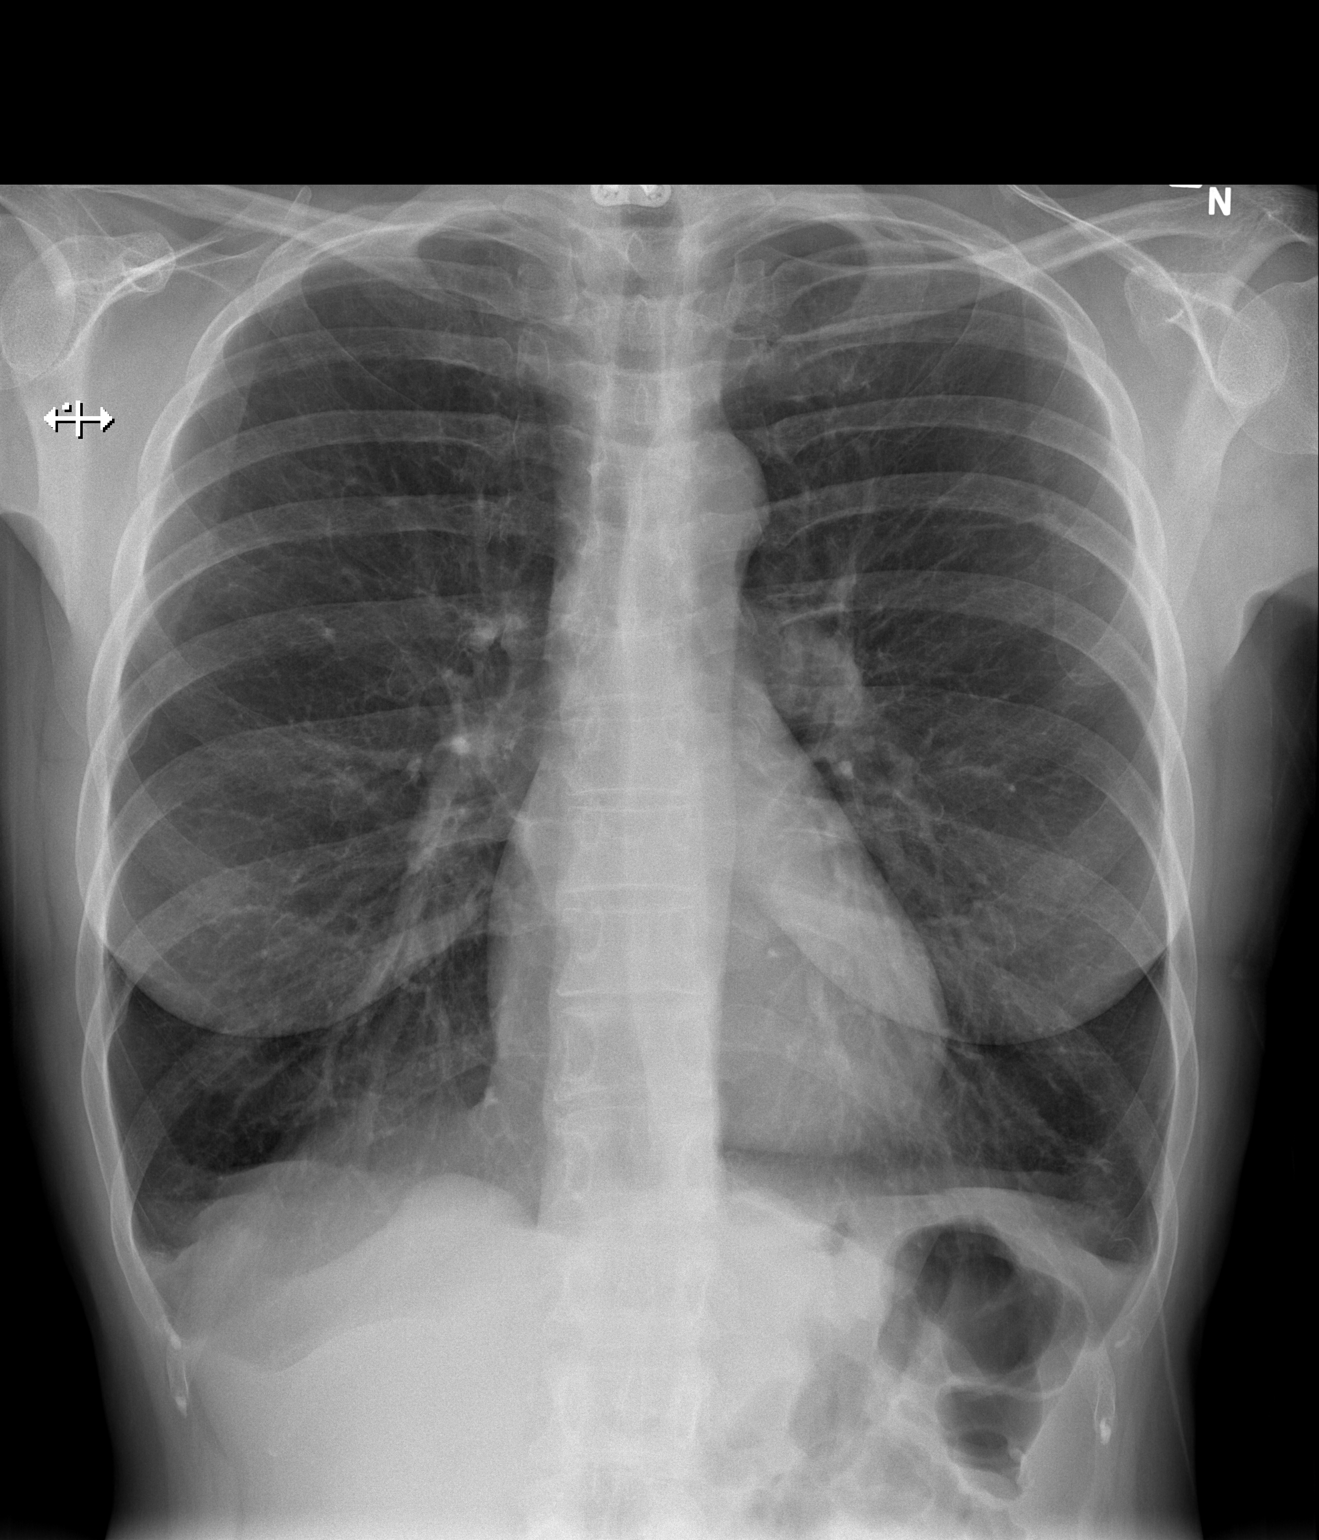

[w chest lat]
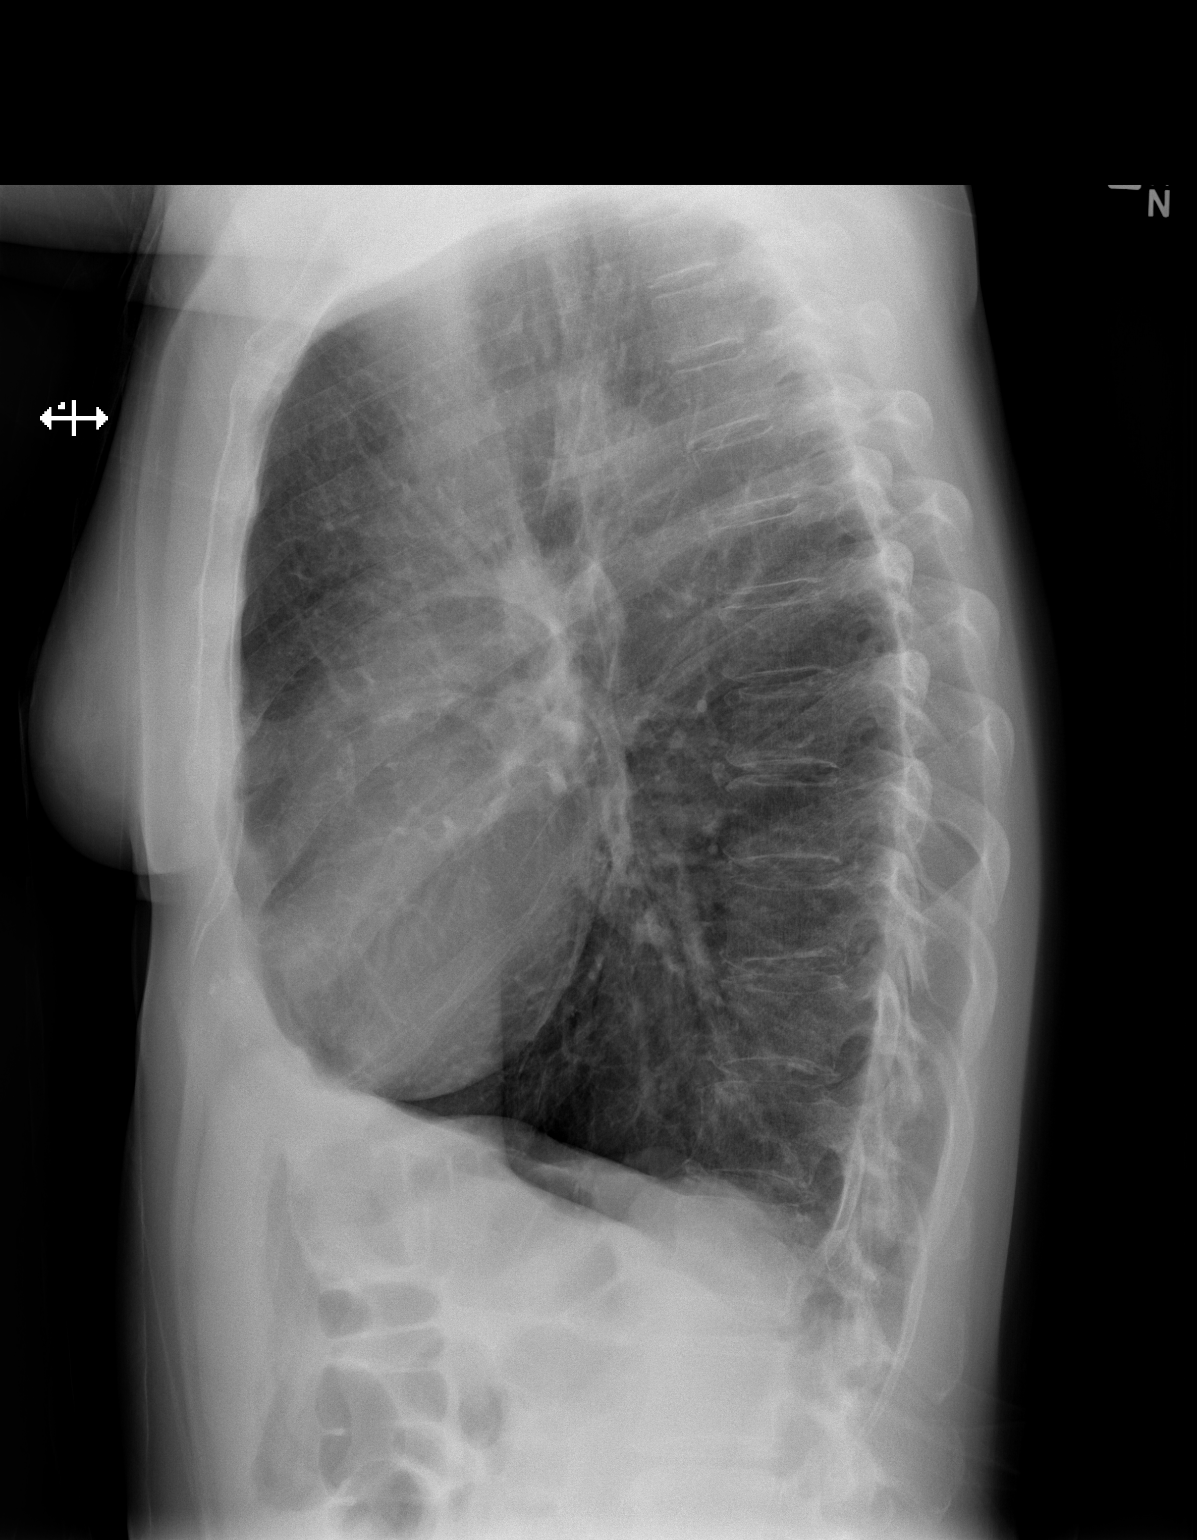

[2 of 2 positions shown; findings below may reference images not displayed]

FINDINGS: Normal heart size, mediastinal contours, and pulmonary vascularity.

Emphysematous changes consistent with COPD.

No pulmonary infiltrate, pleural effusion, or pneumothorax.

Prior cervical spine fusion.

Osseous demineralization.
IMPRESSION: COPD changes.

No acute abnormalities.

## 2014-11-24 IMAGING — RF DG CERVICAL SPINE 2 OR 3 VIEWS
1 series · 3 of 3 positions shown · non-contrast
Comparison: CT 11/11/2012

CLINICAL DATA: Posterior cervical fusion

EXAM:
CERVICAL SPINE - 2-3 VIEW; DG C-ARM 1-60 MIN

[Series 1: run · 3 of 3 slices shown]
[im 1/3]
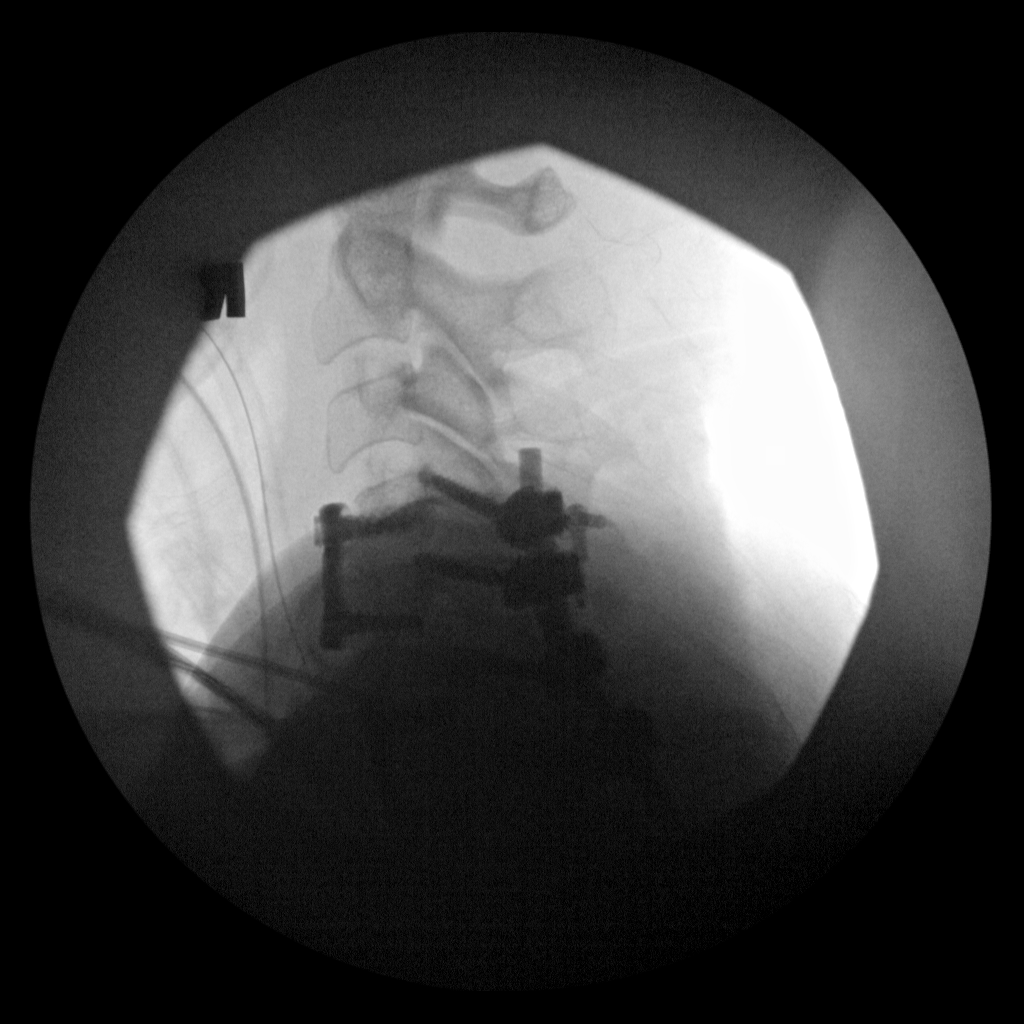
[im 2/3]
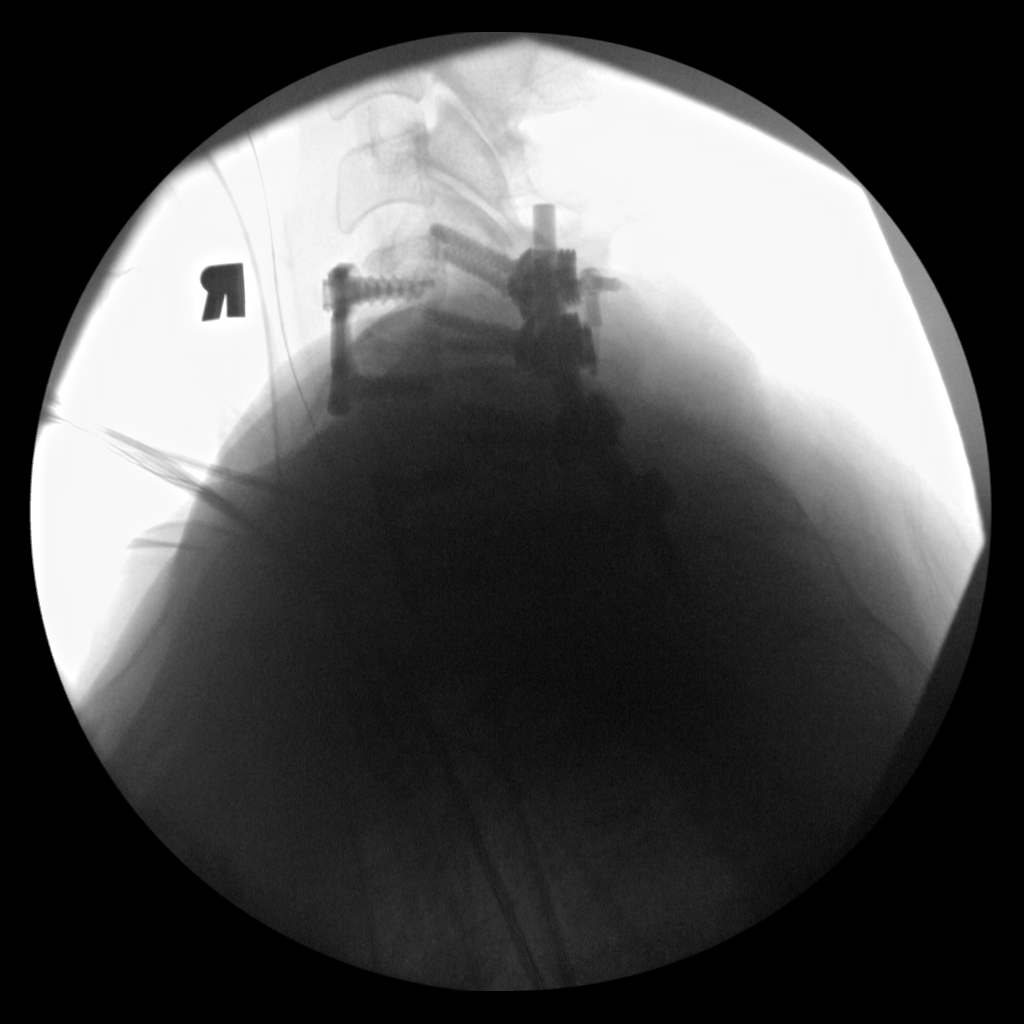
[im 3/3]
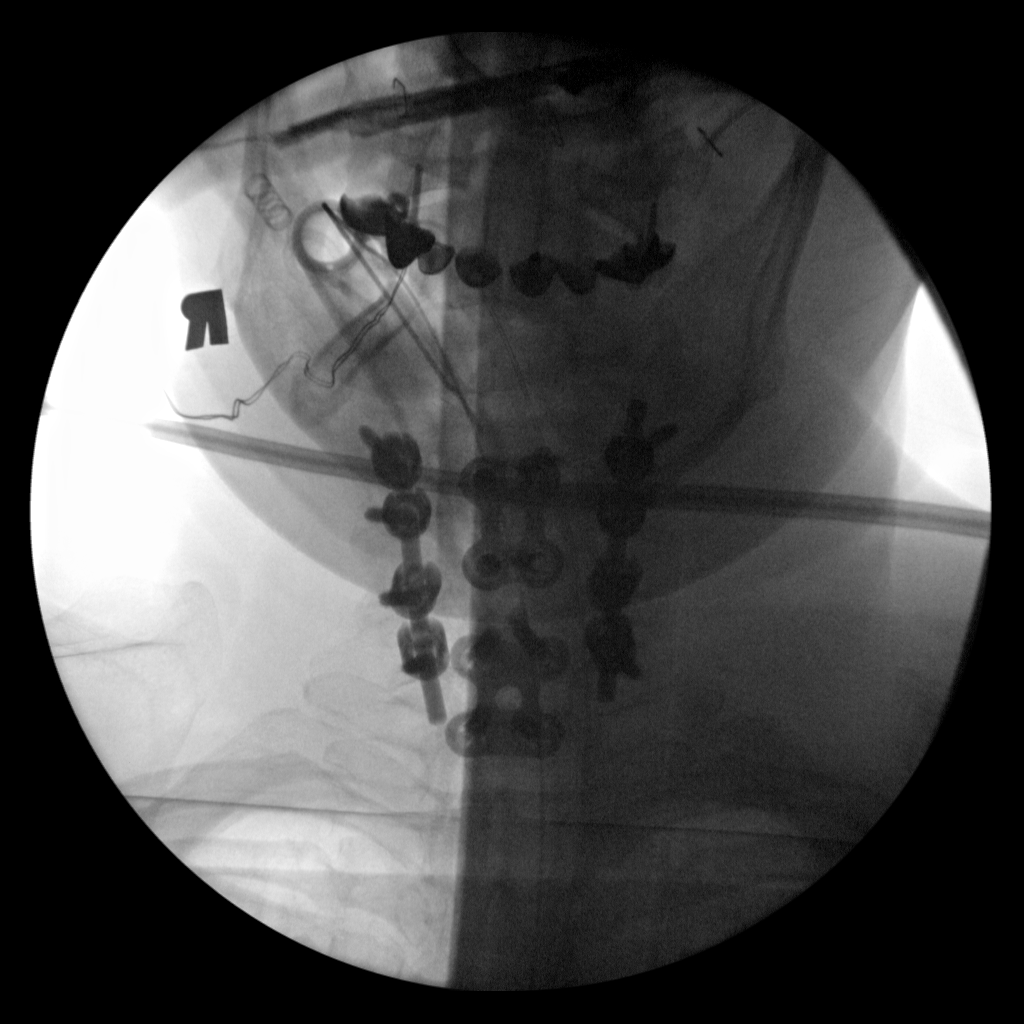

[3 of 3 positions shown; findings below may reference images not displayed]

FINDINGS: Three intraoperative fluoroscopic spot images document changes of
instrumented bilateral posterior cervical fusion C4 to C7. ACDF
hardware at C4-5 and C5-6 as before.
IMPRESSION: Posterior cervical fusion C4-C7.

## 2015-01-12 ENCOUNTER — Other Ambulatory Visit: Payer: Self-pay | Admitting: Family Medicine

## 2015-01-12 DIAGNOSIS — M545 Low back pain: Secondary | ICD-10-CM

## 2015-08-09 DIAGNOSIS — E871 Hypo-osmolality and hyponatremia: Secondary | ICD-10-CM | POA: Diagnosis not present

## 2015-08-09 DIAGNOSIS — Z6822 Body mass index (BMI) 22.0-22.9, adult: Secondary | ICD-10-CM | POA: Diagnosis not present

## 2015-08-09 DIAGNOSIS — R7989 Other specified abnormal findings of blood chemistry: Secondary | ICD-10-CM | POA: Diagnosis not present

## 2015-08-09 DIAGNOSIS — I10 Essential (primary) hypertension: Secondary | ICD-10-CM | POA: Diagnosis not present

## 2015-11-22 DIAGNOSIS — M4696 Unspecified inflammatory spondylopathy, lumbar region: Secondary | ICD-10-CM | POA: Diagnosis not present

## 2015-11-22 DIAGNOSIS — M5416 Radiculopathy, lumbar region: Secondary | ICD-10-CM | POA: Diagnosis not present

## 2015-12-03 DIAGNOSIS — S20302A Unspecified superficial injuries of left front wall of thorax, initial encounter: Secondary | ICD-10-CM | POA: Diagnosis not present

## 2015-12-03 DIAGNOSIS — R0789 Other chest pain: Secondary | ICD-10-CM | POA: Diagnosis not present

## 2015-12-07 DIAGNOSIS — M47816 Spondylosis without myelopathy or radiculopathy, lumbar region: Secondary | ICD-10-CM | POA: Diagnosis not present

## 2015-12-10 IMAGING — CT CT CERVICAL SPINE W/O CM
5 series · 16 of 33 positions shown, 18 images · non-contrast
Comparison: Radiographs dated 08/05/2013 and CT scan dated
11/11/2012

CLINICAL DATA: Pseudoarthrosis of the cervical spine. Neck pain.
Multiple prior cervical spine fusions.

EXAM:
CT CERVICAL SPINE WITHOUT CONTRAST
TECHNIQUE: Multidetector CT imaging of the cervical spine was performed without
intravenous contrast. Multiplanar CT image reconstructions were also
generated.

[Series 3: c spine bone · axial · 0.23mm/px · z∈[+108,+178]mm · 2 of 84 slices shown]
[im 28/84  bone]
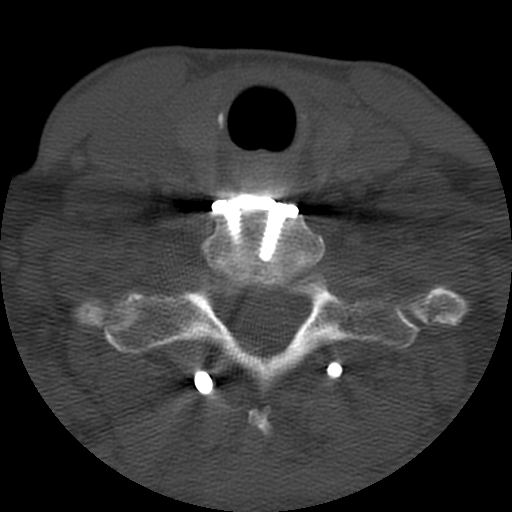
[im 56/84  bone]
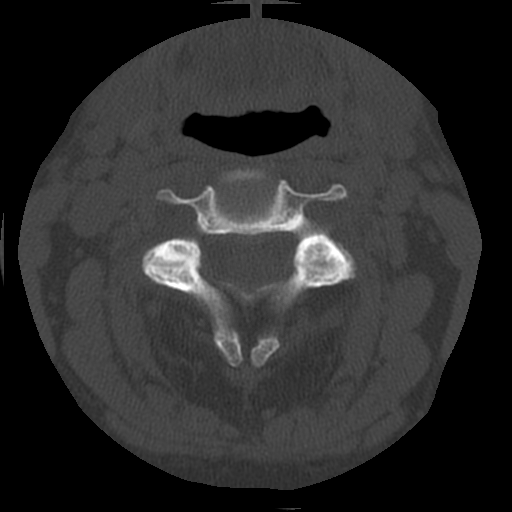

[Series 4: c spine soft · axial · 0.23mm/px · z∈[+108,+178]mm · 2 of 84 slices shown]
[im 28/84  soft-tissue]
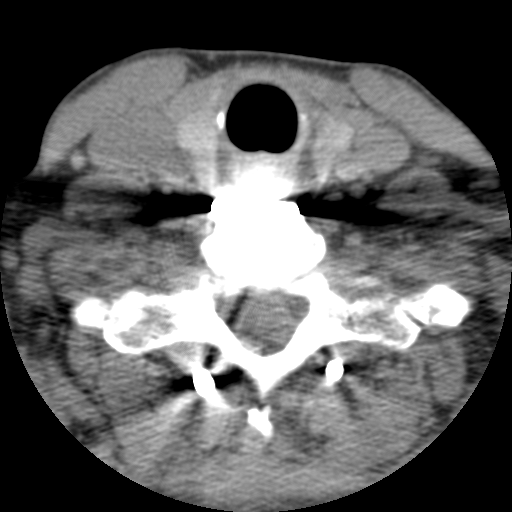
[im 56/84  soft-tissue]
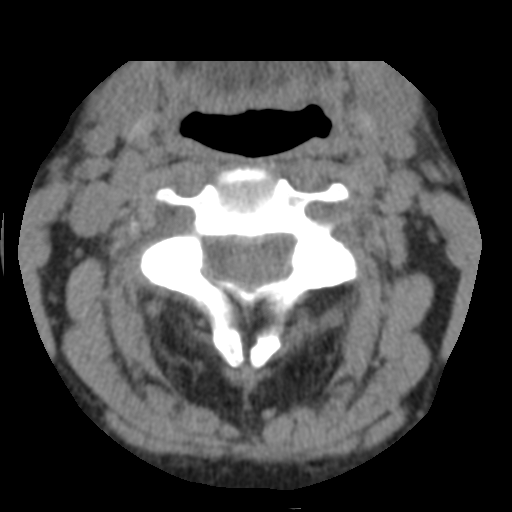

[Series 200: coronal · coronal · 0.42mm/px · 3 of 45 slices shown]
[im 9/45  bone]
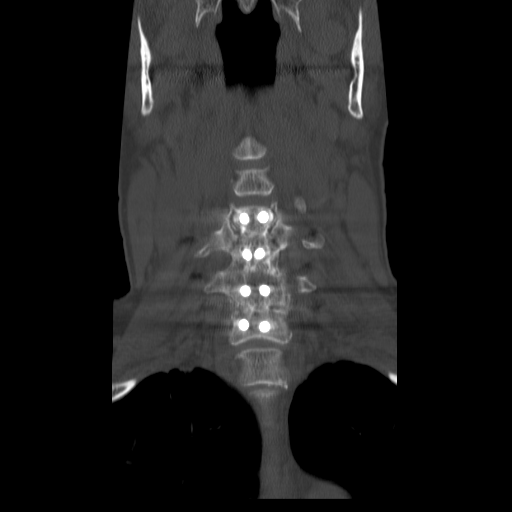
[im 18/45  bone]
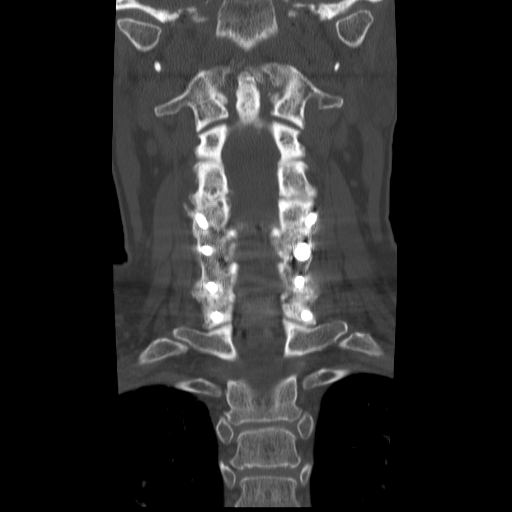
[im 27/45  bone]
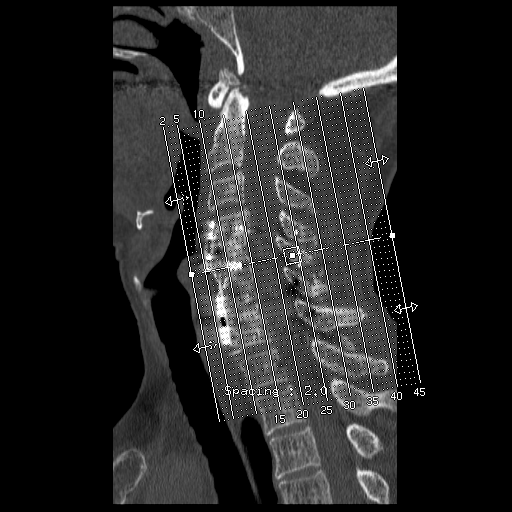

[Series 201: sagittal · sagittal · 0.42mm/px · 5 of 47 slices shown, 6 images]
[im 16/47  bone]
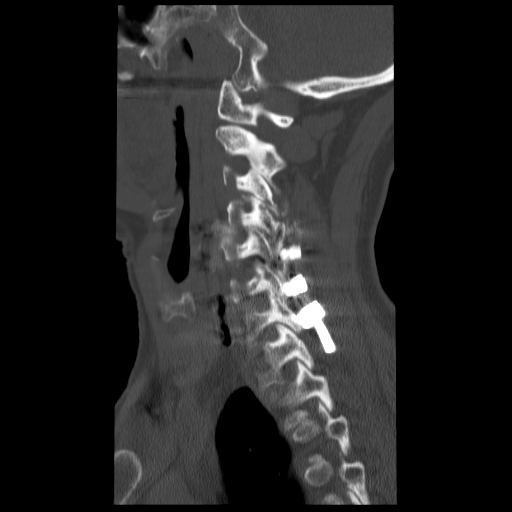
[im 20/47  bone]
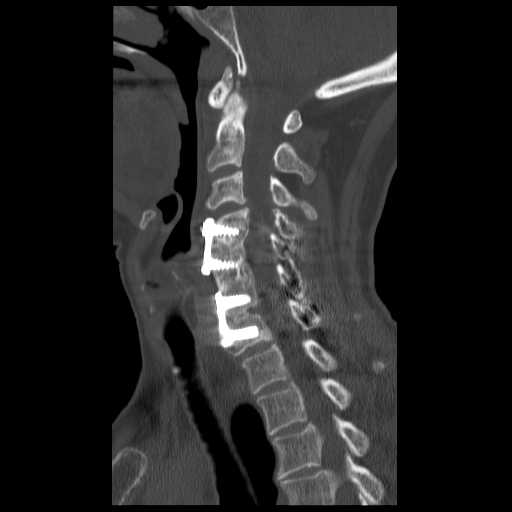
[im 24/47  soft-tissue]
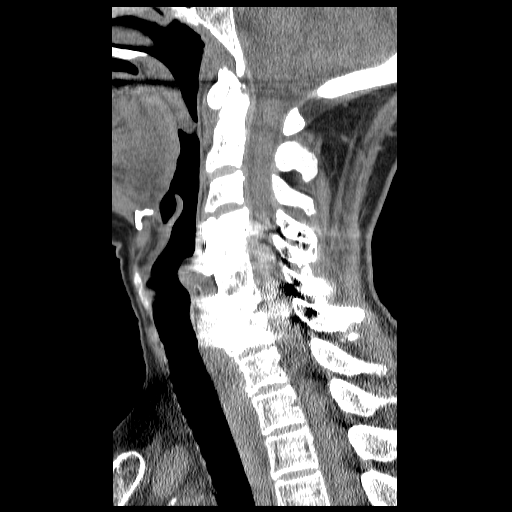
[im 24/47  bone]
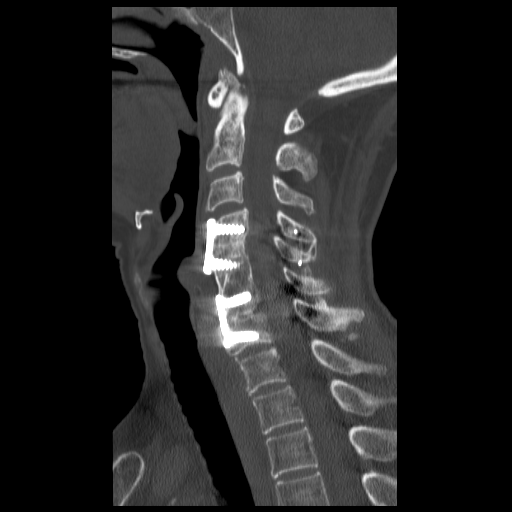
[im 27/47  bone]
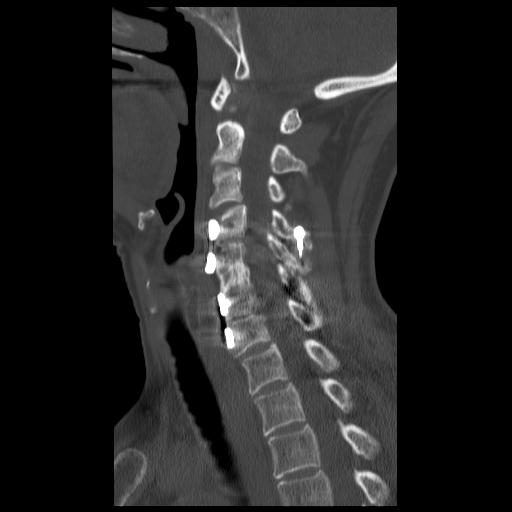
[im 31/47  bone]
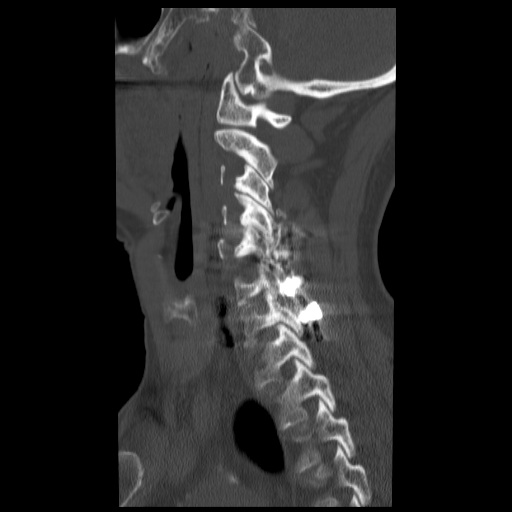

[Series 202: angled axial · axial · 0.42mm/px · z∈[+63,+195]mm · 4 of 113 slices shown, 5 images]
[im 23/113  soft-tissue]
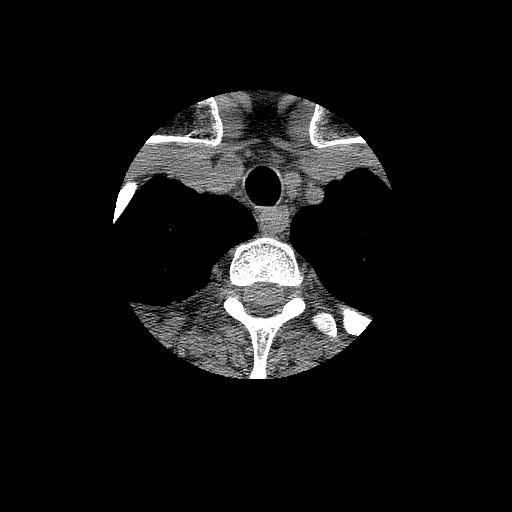
[im 23/113  bone]
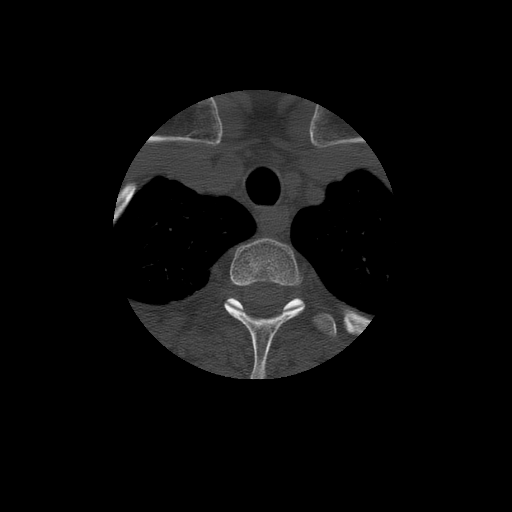
[im 45/113  bone]
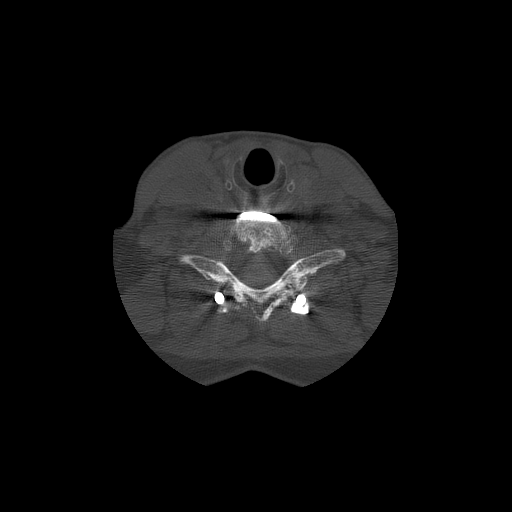
[im 68/113  bone]
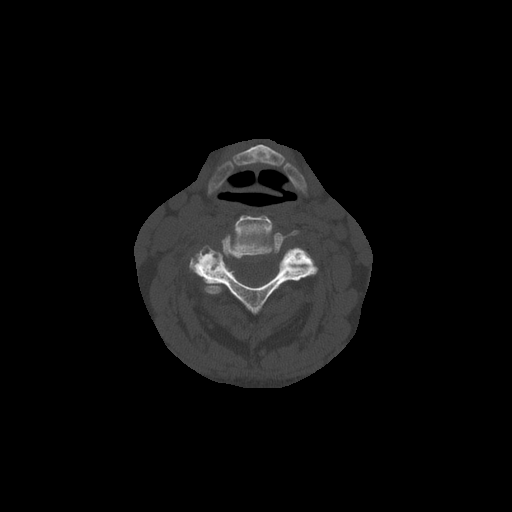
[im 90/113  bone]
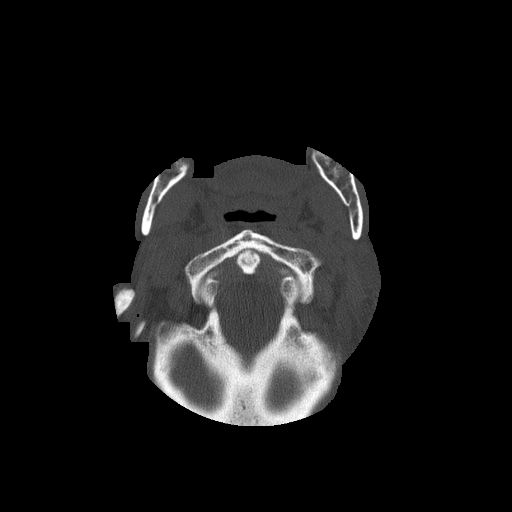

[16 of 33 positions shown; findings below may reference images not displayed]

FINDINGS: C1-2 and C2-3: No change since the prior study. Slight degenerative
changes between the anterior arch of C1 and the odontoid. Slight
bilateral facet arthritis at C2-3. The disc is normal.

C3-4: 1.7 mm spondylolisthesis, increased from 1.2 mm on the prior
study. Progressive moderately severe right facet arthritis with
increased right foraminal stenosis. Small central bulge of the disc.

C4-5 through C6-7: There is now solid fusion of the vertebral bodies
from C4-C7. No residual impingement. Anterior and posterior hardware
is intact with no evidence of loosening.

C7-T1:  Normal.

T1-2 through T3-4:  Normal.
IMPRESSION: 1. There is now solid bony fusion from C4 through C7 with no
residual impingement.
2. Progressive moderately severe right facet arthritis at C3-4 with
increased right foraminal stenosis at C3-4.

## 2016-03-10 DIAGNOSIS — Z1389 Encounter for screening for other disorder: Secondary | ICD-10-CM | POA: Diagnosis not present

## 2016-03-10 DIAGNOSIS — Z6821 Body mass index (BMI) 21.0-21.9, adult: Secondary | ICD-10-CM | POA: Diagnosis not present

## 2016-03-10 DIAGNOSIS — I10 Essential (primary) hypertension: Secondary | ICD-10-CM | POA: Diagnosis not present

## 2016-03-10 DIAGNOSIS — R7989 Other specified abnormal findings of blood chemistry: Secondary | ICD-10-CM | POA: Diagnosis not present

## 2016-03-31 DIAGNOSIS — J189 Pneumonia, unspecified organism: Secondary | ICD-10-CM | POA: Diagnosis not present

## 2016-04-04 DIAGNOSIS — J189 Pneumonia, unspecified organism: Secondary | ICD-10-CM | POA: Diagnosis not present

## 2016-04-04 DIAGNOSIS — R0600 Dyspnea, unspecified: Secondary | ICD-10-CM | POA: Diagnosis not present

## 2016-04-04 DIAGNOSIS — J64 Unspecified pneumoconiosis: Secondary | ICD-10-CM | POA: Diagnosis not present

## 2016-04-19 DIAGNOSIS — Z6820 Body mass index (BMI) 20.0-20.9, adult: Secondary | ICD-10-CM | POA: Diagnosis not present

## 2016-04-19 DIAGNOSIS — J189 Pneumonia, unspecified organism: Secondary | ICD-10-CM | POA: Diagnosis not present

## 2016-04-19 DIAGNOSIS — Z1389 Encounter for screening for other disorder: Secondary | ICD-10-CM | POA: Diagnosis not present

## 2016-04-19 DIAGNOSIS — J449 Chronic obstructive pulmonary disease, unspecified: Secondary | ICD-10-CM | POA: Diagnosis not present

## 2016-04-19 DIAGNOSIS — R05 Cough: Secondary | ICD-10-CM | POA: Diagnosis not present

## 2016-04-27 DIAGNOSIS — Z6821 Body mass index (BMI) 21.0-21.9, adult: Secondary | ICD-10-CM | POA: Diagnosis not present

## 2016-04-27 DIAGNOSIS — J189 Pneumonia, unspecified organism: Secondary | ICD-10-CM | POA: Diagnosis not present

## 2016-04-27 DIAGNOSIS — R0602 Shortness of breath: Secondary | ICD-10-CM | POA: Diagnosis not present

## 2016-06-12 DIAGNOSIS — M545 Low back pain: Secondary | ICD-10-CM | POA: Diagnosis not present

## 2016-06-27 DIAGNOSIS — M47816 Spondylosis without myelopathy or radiculopathy, lumbar region: Secondary | ICD-10-CM | POA: Diagnosis not present

## 2016-07-04 DIAGNOSIS — M47816 Spondylosis without myelopathy or radiculopathy, lumbar region: Secondary | ICD-10-CM | POA: Diagnosis not present

## 2016-07-31 DIAGNOSIS — M47816 Spondylosis without myelopathy or radiculopathy, lumbar region: Secondary | ICD-10-CM | POA: Diagnosis not present

## 2016-08-22 DIAGNOSIS — M47816 Spondylosis without myelopathy or radiculopathy, lumbar region: Secondary | ICD-10-CM | POA: Diagnosis not present

## 2016-08-28 DIAGNOSIS — E785 Hyperlipidemia, unspecified: Secondary | ICD-10-CM | POA: Diagnosis not present

## 2016-08-28 DIAGNOSIS — Z6820 Body mass index (BMI) 20.0-20.9, adult: Secondary | ICD-10-CM | POA: Diagnosis not present

## 2016-08-28 DIAGNOSIS — I10 Essential (primary) hypertension: Secondary | ICD-10-CM | POA: Diagnosis not present

## 2016-08-28 DIAGNOSIS — Z79899 Other long term (current) drug therapy: Secondary | ICD-10-CM | POA: Diagnosis not present

## 2016-09-11 DIAGNOSIS — M47816 Spondylosis without myelopathy or radiculopathy, lumbar region: Secondary | ICD-10-CM | POA: Diagnosis not present

## 2016-11-27 DIAGNOSIS — E785 Hyperlipidemia, unspecified: Secondary | ICD-10-CM | POA: Diagnosis not present

## 2016-11-27 DIAGNOSIS — J449 Chronic obstructive pulmonary disease, unspecified: Secondary | ICD-10-CM | POA: Diagnosis not present

## 2016-11-27 DIAGNOSIS — I10 Essential (primary) hypertension: Secondary | ICD-10-CM | POA: Diagnosis not present

## 2016-11-27 DIAGNOSIS — Z79899 Other long term (current) drug therapy: Secondary | ICD-10-CM | POA: Diagnosis not present

## 2016-11-27 DIAGNOSIS — Z23 Encounter for immunization: Secondary | ICD-10-CM | POA: Diagnosis not present

## 2017-03-05 DIAGNOSIS — I10 Essential (primary) hypertension: Secondary | ICD-10-CM | POA: Diagnosis not present

## 2017-03-05 DIAGNOSIS — Z79899 Other long term (current) drug therapy: Secondary | ICD-10-CM | POA: Diagnosis not present

## 2017-03-05 DIAGNOSIS — E785 Hyperlipidemia, unspecified: Secondary | ICD-10-CM | POA: Diagnosis not present

## 2017-03-05 DIAGNOSIS — J449 Chronic obstructive pulmonary disease, unspecified: Secondary | ICD-10-CM | POA: Diagnosis not present

## 2017-03-12 DIAGNOSIS — M545 Low back pain: Secondary | ICD-10-CM | POA: Diagnosis not present

## 2017-04-02 DIAGNOSIS — Z79899 Other long term (current) drug therapy: Secondary | ICD-10-CM | POA: Diagnosis not present

## 2017-04-02 DIAGNOSIS — E785 Hyperlipidemia, unspecified: Secondary | ICD-10-CM | POA: Diagnosis not present

## 2017-04-02 DIAGNOSIS — J069 Acute upper respiratory infection, unspecified: Secondary | ICD-10-CM | POA: Diagnosis not present

## 2017-04-02 DIAGNOSIS — J449 Chronic obstructive pulmonary disease, unspecified: Secondary | ICD-10-CM | POA: Diagnosis not present

## 2017-04-02 DIAGNOSIS — I10 Essential (primary) hypertension: Secondary | ICD-10-CM | POA: Diagnosis not present

## 2017-04-25 DIAGNOSIS — M47816 Spondylosis without myelopathy or radiculopathy, lumbar region: Secondary | ICD-10-CM | POA: Diagnosis not present

## 2017-05-14 DIAGNOSIS — M47816 Spondylosis without myelopathy or radiculopathy, lumbar region: Secondary | ICD-10-CM | POA: Diagnosis not present

## 2017-06-04 DIAGNOSIS — Z1331 Encounter for screening for depression: Secondary | ICD-10-CM | POA: Diagnosis not present

## 2017-06-04 DIAGNOSIS — L989 Disorder of the skin and subcutaneous tissue, unspecified: Secondary | ICD-10-CM | POA: Diagnosis not present

## 2017-06-04 DIAGNOSIS — I10 Essential (primary) hypertension: Secondary | ICD-10-CM | POA: Diagnosis not present

## 2017-06-04 DIAGNOSIS — J449 Chronic obstructive pulmonary disease, unspecified: Secondary | ICD-10-CM | POA: Diagnosis not present

## 2017-06-04 DIAGNOSIS — Z79899 Other long term (current) drug therapy: Secondary | ICD-10-CM | POA: Diagnosis not present

## 2017-06-04 DIAGNOSIS — E785 Hyperlipidemia, unspecified: Secondary | ICD-10-CM | POA: Diagnosis not present

## 2017-07-02 DIAGNOSIS — C44622 Squamous cell carcinoma of skin of right upper limb, including shoulder: Secondary | ICD-10-CM | POA: Diagnosis not present

## 2017-07-02 DIAGNOSIS — R233 Spontaneous ecchymoses: Secondary | ICD-10-CM | POA: Diagnosis not present

## 2017-07-25 DIAGNOSIS — L821 Other seborrheic keratosis: Secondary | ICD-10-CM | POA: Diagnosis not present

## 2017-07-25 DIAGNOSIS — L578 Other skin changes due to chronic exposure to nonionizing radiation: Secondary | ICD-10-CM | POA: Diagnosis not present

## 2017-07-25 DIAGNOSIS — C44622 Squamous cell carcinoma of skin of right upper limb, including shoulder: Secondary | ICD-10-CM | POA: Diagnosis not present

## 2017-07-25 DIAGNOSIS — L570 Actinic keratosis: Secondary | ICD-10-CM | POA: Diagnosis not present

## 2017-07-25 DIAGNOSIS — D225 Melanocytic nevi of trunk: Secondary | ICD-10-CM | POA: Diagnosis not present

## 2017-08-13 DIAGNOSIS — C44622 Squamous cell carcinoma of skin of right upper limb, including shoulder: Secondary | ICD-10-CM | POA: Diagnosis not present

## 2017-09-06 DIAGNOSIS — H6123 Impacted cerumen, bilateral: Secondary | ICD-10-CM | POA: Diagnosis not present

## 2017-09-06 DIAGNOSIS — Z6821 Body mass index (BMI) 21.0-21.9, adult: Secondary | ICD-10-CM | POA: Diagnosis not present

## 2017-10-09 DIAGNOSIS — Z6821 Body mass index (BMI) 21.0-21.9, adult: Secondary | ICD-10-CM | POA: Diagnosis not present

## 2017-10-09 DIAGNOSIS — K625 Hemorrhage of anus and rectum: Secondary | ICD-10-CM | POA: Diagnosis not present

## 2017-10-09 DIAGNOSIS — K5090 Crohn's disease, unspecified, without complications: Secondary | ICD-10-CM | POA: Diagnosis not present

## 2017-10-23 DIAGNOSIS — K921 Melena: Secondary | ICD-10-CM | POA: Diagnosis not present

## 2017-10-23 DIAGNOSIS — K5000 Crohn's disease of small intestine without complications: Secondary | ICD-10-CM | POA: Diagnosis not present

## 2017-11-05 DIAGNOSIS — M545 Low back pain: Secondary | ICD-10-CM | POA: Diagnosis not present

## 2017-11-19 DIAGNOSIS — D122 Benign neoplasm of ascending colon: Secondary | ICD-10-CM | POA: Diagnosis not present

## 2017-11-19 DIAGNOSIS — K5090 Crohn's disease, unspecified, without complications: Secondary | ICD-10-CM | POA: Diagnosis not present

## 2017-11-19 DIAGNOSIS — K5730 Diverticulosis of large intestine without perforation or abscess without bleeding: Secondary | ICD-10-CM | POA: Diagnosis not present

## 2017-11-19 DIAGNOSIS — K635 Polyp of colon: Secondary | ICD-10-CM | POA: Diagnosis not present

## 2017-11-19 DIAGNOSIS — K625 Hemorrhage of anus and rectum: Secondary | ICD-10-CM | POA: Diagnosis not present

## 2017-11-19 DIAGNOSIS — K5000 Crohn's disease of small intestine without complications: Secondary | ICD-10-CM | POA: Diagnosis not present

## 2017-11-19 DIAGNOSIS — K921 Melena: Secondary | ICD-10-CM | POA: Diagnosis not present

## 2017-12-03 DIAGNOSIS — B079 Viral wart, unspecified: Secondary | ICD-10-CM | POA: Diagnosis not present

## 2017-12-03 DIAGNOSIS — J449 Chronic obstructive pulmonary disease, unspecified: Secondary | ICD-10-CM | POA: Diagnosis not present

## 2017-12-03 DIAGNOSIS — Z79899 Other long term (current) drug therapy: Secondary | ICD-10-CM | POA: Diagnosis not present

## 2017-12-03 DIAGNOSIS — I10 Essential (primary) hypertension: Secondary | ICD-10-CM | POA: Diagnosis not present

## 2017-12-03 DIAGNOSIS — E785 Hyperlipidemia, unspecified: Secondary | ICD-10-CM | POA: Diagnosis not present

## 2017-12-03 DIAGNOSIS — L301 Dyshidrosis [pompholyx]: Secondary | ICD-10-CM | POA: Diagnosis not present

## 2018-02-11 DIAGNOSIS — M47816 Spondylosis without myelopathy or radiculopathy, lumbar region: Secondary | ICD-10-CM | POA: Diagnosis not present

## 2018-02-13 DIAGNOSIS — M79673 Pain in unspecified foot: Secondary | ICD-10-CM | POA: Diagnosis not present

## 2018-02-13 DIAGNOSIS — J449 Chronic obstructive pulmonary disease, unspecified: Secondary | ICD-10-CM | POA: Diagnosis not present

## 2018-02-13 DIAGNOSIS — Z6821 Body mass index (BMI) 21.0-21.9, adult: Secondary | ICD-10-CM | POA: Diagnosis not present

## 2018-02-19 DIAGNOSIS — M47816 Spondylosis without myelopathy or radiculopathy, lumbar region: Secondary | ICD-10-CM | POA: Diagnosis not present

## 2018-02-26 DIAGNOSIS — M47816 Spondylosis without myelopathy or radiculopathy, lumbar region: Secondary | ICD-10-CM | POA: Diagnosis not present

## 2018-02-28 DIAGNOSIS — M47816 Spondylosis without myelopathy or radiculopathy, lumbar region: Secondary | ICD-10-CM | POA: Diagnosis not present

## 2018-03-07 DIAGNOSIS — M47816 Spondylosis without myelopathy or radiculopathy, lumbar region: Secondary | ICD-10-CM | POA: Diagnosis not present

## 2018-03-11 DIAGNOSIS — M47816 Spondylosis without myelopathy or radiculopathy, lumbar region: Secondary | ICD-10-CM | POA: Diagnosis not present

## 2018-03-13 DIAGNOSIS — M47816 Spondylosis without myelopathy or radiculopathy, lumbar region: Secondary | ICD-10-CM | POA: Diagnosis not present

## 2018-03-18 DIAGNOSIS — M47816 Spondylosis without myelopathy or radiculopathy, lumbar region: Secondary | ICD-10-CM | POA: Diagnosis not present

## 2018-04-08 DIAGNOSIS — M47816 Spondylosis without myelopathy or radiculopathy, lumbar region: Secondary | ICD-10-CM | POA: Diagnosis not present

## 2018-06-03 DIAGNOSIS — I10 Essential (primary) hypertension: Secondary | ICD-10-CM | POA: Diagnosis not present

## 2018-06-03 DIAGNOSIS — Z79899 Other long term (current) drug therapy: Secondary | ICD-10-CM | POA: Diagnosis not present

## 2018-06-03 DIAGNOSIS — L570 Actinic keratosis: Secondary | ICD-10-CM | POA: Diagnosis not present

## 2018-06-03 DIAGNOSIS — K5090 Crohn's disease, unspecified, without complications: Secondary | ICD-10-CM | POA: Diagnosis not present

## 2018-06-03 DIAGNOSIS — E785 Hyperlipidemia, unspecified: Secondary | ICD-10-CM | POA: Diagnosis not present

## 2018-06-03 DIAGNOSIS — J449 Chronic obstructive pulmonary disease, unspecified: Secondary | ICD-10-CM | POA: Diagnosis not present

## 2018-06-03 DIAGNOSIS — C44622 Squamous cell carcinoma of skin of right upper limb, including shoulder: Secondary | ICD-10-CM | POA: Diagnosis not present

## 2018-10-02 DIAGNOSIS — B001 Herpesviral vesicular dermatitis: Secondary | ICD-10-CM | POA: Diagnosis not present

## 2018-10-02 DIAGNOSIS — Z6821 Body mass index (BMI) 21.0-21.9, adult: Secondary | ICD-10-CM | POA: Diagnosis not present

## 2018-10-02 DIAGNOSIS — J449 Chronic obstructive pulmonary disease, unspecified: Secondary | ICD-10-CM | POA: Diagnosis not present

## 2018-10-02 DIAGNOSIS — R05 Cough: Secondary | ICD-10-CM | POA: Diagnosis not present

## 2018-11-22 DIAGNOSIS — Z20828 Contact with and (suspected) exposure to other viral communicable diseases: Secondary | ICD-10-CM | POA: Diagnosis not present

## 2018-11-22 DIAGNOSIS — J449 Chronic obstructive pulmonary disease, unspecified: Secondary | ICD-10-CM | POA: Diagnosis not present

## 2018-11-22 DIAGNOSIS — R6889 Other general symptoms and signs: Secondary | ICD-10-CM | POA: Diagnosis not present

## 2018-12-18 DIAGNOSIS — Z1331 Encounter for screening for depression: Secondary | ICD-10-CM | POA: Diagnosis not present

## 2018-12-18 DIAGNOSIS — E785 Hyperlipidemia, unspecified: Secondary | ICD-10-CM | POA: Diagnosis not present

## 2018-12-18 DIAGNOSIS — Z79899 Other long term (current) drug therapy: Secondary | ICD-10-CM | POA: Diagnosis not present

## 2018-12-18 DIAGNOSIS — J449 Chronic obstructive pulmonary disease, unspecified: Secondary | ICD-10-CM | POA: Diagnosis not present

## 2018-12-18 DIAGNOSIS — R05 Cough: Secondary | ICD-10-CM | POA: Diagnosis not present

## 2018-12-18 DIAGNOSIS — I10 Essential (primary) hypertension: Secondary | ICD-10-CM | POA: Diagnosis not present

## 2018-12-26 DIAGNOSIS — R05 Cough: Secondary | ICD-10-CM | POA: Diagnosis not present

## 2019-02-10 DIAGNOSIS — J4540 Moderate persistent asthma, uncomplicated: Secondary | ICD-10-CM | POA: Diagnosis not present

## 2019-02-10 DIAGNOSIS — Z01110 Encounter for hearing examination following failed hearing screening: Secondary | ICD-10-CM | POA: Diagnosis not present

## 2019-02-10 DIAGNOSIS — Z8616 Personal history of COVID-19: Secondary | ICD-10-CM | POA: Diagnosis not present

## 2019-02-10 DIAGNOSIS — J301 Allergic rhinitis due to pollen: Secondary | ICD-10-CM | POA: Diagnosis not present

## 2019-02-10 DIAGNOSIS — J84116 Cryptogenic organizing pneumonia: Secondary | ICD-10-CM | POA: Diagnosis not present

## 2019-02-17 DIAGNOSIS — J439 Emphysema, unspecified: Secondary | ICD-10-CM | POA: Diagnosis not present

## 2019-02-17 DIAGNOSIS — J84116 Cryptogenic organizing pneumonia: Secondary | ICD-10-CM | POA: Diagnosis not present

## 2019-02-22 DIAGNOSIS — J4540 Moderate persistent asthma, uncomplicated: Secondary | ICD-10-CM | POA: Diagnosis not present

## 2019-02-24 DIAGNOSIS — J301 Allergic rhinitis due to pollen: Secondary | ICD-10-CM | POA: Diagnosis not present

## 2019-02-24 DIAGNOSIS — J84116 Cryptogenic organizing pneumonia: Secondary | ICD-10-CM | POA: Diagnosis not present

## 2019-02-24 DIAGNOSIS — J4540 Moderate persistent asthma, uncomplicated: Secondary | ICD-10-CM | POA: Diagnosis not present

## 2019-02-25 DIAGNOSIS — Z1152 Encounter for screening for COVID-19: Secondary | ICD-10-CM | POA: Diagnosis not present

## 2019-02-25 DIAGNOSIS — Z20822 Contact with and (suspected) exposure to covid-19: Secondary | ICD-10-CM | POA: Diagnosis not present

## 2019-02-27 DIAGNOSIS — J4540 Moderate persistent asthma, uncomplicated: Secondary | ICD-10-CM | POA: Diagnosis not present

## 2019-03-03 DIAGNOSIS — J4540 Moderate persistent asthma, uncomplicated: Secondary | ICD-10-CM | POA: Diagnosis not present

## 2019-03-03 DIAGNOSIS — J9819 Other pulmonary collapse: Secondary | ICD-10-CM | POA: Diagnosis not present

## 2019-03-03 DIAGNOSIS — G4733 Obstructive sleep apnea (adult) (pediatric): Secondary | ICD-10-CM | POA: Diagnosis not present

## 2019-03-03 DIAGNOSIS — Z9981 Dependence on supplemental oxygen: Secondary | ICD-10-CM | POA: Diagnosis not present

## 2019-03-03 DIAGNOSIS — J84116 Cryptogenic organizing pneumonia: Secondary | ICD-10-CM | POA: Diagnosis not present

## 2019-03-03 DIAGNOSIS — R0602 Shortness of breath: Secondary | ICD-10-CM | POA: Diagnosis not present

## 2019-03-03 DIAGNOSIS — C3490 Malignant neoplasm of unspecified part of unspecified bronchus or lung: Secondary | ICD-10-CM | POA: Diagnosis not present

## 2019-03-03 DIAGNOSIS — R918 Other nonspecific abnormal finding of lung field: Secondary | ICD-10-CM | POA: Diagnosis not present

## 2019-03-07 DIAGNOSIS — C3431 Malignant neoplasm of lower lobe, right bronchus or lung: Secondary | ICD-10-CM | POA: Diagnosis not present

## 2019-03-07 DIAGNOSIS — G4730 Sleep apnea, unspecified: Secondary | ICD-10-CM | POA: Diagnosis not present

## 2019-03-07 DIAGNOSIS — J4540 Moderate persistent asthma, uncomplicated: Secondary | ICD-10-CM | POA: Diagnosis not present

## 2019-03-07 DIAGNOSIS — J301 Allergic rhinitis due to pollen: Secondary | ICD-10-CM | POA: Diagnosis not present

## 2019-03-11 DIAGNOSIS — J45901 Unspecified asthma with (acute) exacerbation: Secondary | ICD-10-CM | POA: Diagnosis not present

## 2019-03-12 DIAGNOSIS — C3490 Malignant neoplasm of unspecified part of unspecified bronchus or lung: Secondary | ICD-10-CM | POA: Diagnosis not present

## 2019-03-12 DIAGNOSIS — R918 Other nonspecific abnormal finding of lung field: Secondary | ICD-10-CM | POA: Diagnosis not present

## 2019-03-14 DIAGNOSIS — C3481 Malignant neoplasm of overlapping sites of right bronchus and lung: Secondary | ICD-10-CM | POA: Diagnosis not present

## 2019-03-19 DIAGNOSIS — J449 Chronic obstructive pulmonary disease, unspecified: Secondary | ICD-10-CM | POA: Diagnosis not present

## 2019-03-19 DIAGNOSIS — C3490 Malignant neoplasm of unspecified part of unspecified bronchus or lung: Secondary | ICD-10-CM | POA: Diagnosis not present

## 2019-03-19 DIAGNOSIS — Z6821 Body mass index (BMI) 21.0-21.9, adult: Secondary | ICD-10-CM | POA: Diagnosis not present

## 2019-03-24 DIAGNOSIS — R918 Other nonspecific abnormal finding of lung field: Secondary | ICD-10-CM | POA: Diagnosis not present

## 2019-03-24 DIAGNOSIS — J4540 Moderate persistent asthma, uncomplicated: Secondary | ICD-10-CM | POA: Diagnosis not present

## 2019-03-24 DIAGNOSIS — C3490 Malignant neoplasm of unspecified part of unspecified bronchus or lung: Secondary | ICD-10-CM | POA: Diagnosis not present

## 2019-03-25 DIAGNOSIS — C3481 Malignant neoplasm of overlapping sites of right bronchus and lung: Secondary | ICD-10-CM | POA: Diagnosis not present

## 2019-03-27 DIAGNOSIS — J4540 Moderate persistent asthma, uncomplicated: Secondary | ICD-10-CM | POA: Diagnosis not present

## 2019-03-31 DIAGNOSIS — J301 Allergic rhinitis due to pollen: Secondary | ICD-10-CM | POA: Diagnosis not present

## 2019-03-31 DIAGNOSIS — C3431 Malignant neoplasm of lower lobe, right bronchus or lung: Secondary | ICD-10-CM | POA: Diagnosis not present

## 2019-03-31 DIAGNOSIS — Z8616 Personal history of COVID-19: Secondary | ICD-10-CM | POA: Diagnosis not present

## 2019-03-31 DIAGNOSIS — J4540 Moderate persistent asthma, uncomplicated: Secondary | ICD-10-CM | POA: Diagnosis not present

## 2019-03-31 DIAGNOSIS — G4730 Sleep apnea, unspecified: Secondary | ICD-10-CM | POA: Diagnosis not present

## 2019-04-01 DIAGNOSIS — C3491 Malignant neoplasm of unspecified part of right bronchus or lung: Secondary | ICD-10-CM | POA: Diagnosis not present

## 2019-04-04 DIAGNOSIS — Z23 Encounter for immunization: Secondary | ICD-10-CM | POA: Diagnosis not present

## 2019-04-08 DIAGNOSIS — C3481 Malignant neoplasm of overlapping sites of right bronchus and lung: Secondary | ICD-10-CM | POA: Diagnosis not present

## 2019-04-09 DIAGNOSIS — C3490 Malignant neoplasm of unspecified part of unspecified bronchus or lung: Secondary | ICD-10-CM | POA: Diagnosis not present

## 2019-04-09 DIAGNOSIS — Z20828 Contact with and (suspected) exposure to other viral communicable diseases: Secondary | ICD-10-CM | POA: Diagnosis not present

## 2019-04-09 DIAGNOSIS — R0602 Shortness of breath: Secondary | ICD-10-CM | POA: Diagnosis not present

## 2019-04-09 DIAGNOSIS — C3481 Malignant neoplasm of overlapping sites of right bronchus and lung: Secondary | ICD-10-CM | POA: Diagnosis not present

## 2019-04-10 DIAGNOSIS — I10 Essential (primary) hypertension: Secondary | ICD-10-CM | POA: Diagnosis not present

## 2019-04-10 DIAGNOSIS — E785 Hyperlipidemia, unspecified: Secondary | ICD-10-CM | POA: Diagnosis not present

## 2019-04-10 DIAGNOSIS — E7800 Pure hypercholesterolemia, unspecified: Secondary | ICD-10-CM | POA: Diagnosis not present

## 2019-04-10 DIAGNOSIS — J90 Pleural effusion, not elsewhere classified: Secondary | ICD-10-CM | POA: Diagnosis not present

## 2019-04-10 DIAGNOSIS — M1990 Unspecified osteoarthritis, unspecified site: Secondary | ICD-10-CM | POA: Diagnosis not present

## 2019-04-10 DIAGNOSIS — C3481 Malignant neoplasm of overlapping sites of right bronchus and lung: Secondary | ICD-10-CM | POA: Diagnosis not present

## 2019-04-10 DIAGNOSIS — Z87891 Personal history of nicotine dependence: Secondary | ICD-10-CM | POA: Diagnosis not present

## 2019-04-10 DIAGNOSIS — Z9981 Dependence on supplemental oxygen: Secondary | ICD-10-CM | POA: Diagnosis not present

## 2019-04-10 DIAGNOSIS — Z452 Encounter for adjustment and management of vascular access device: Secondary | ICD-10-CM | POA: Diagnosis not present

## 2019-04-10 DIAGNOSIS — Z8616 Personal history of COVID-19: Secondary | ICD-10-CM | POA: Diagnosis not present

## 2019-04-10 DIAGNOSIS — C3490 Malignant neoplasm of unspecified part of unspecified bronchus or lung: Secondary | ICD-10-CM | POA: Diagnosis not present

## 2019-04-10 DIAGNOSIS — Z79899 Other long term (current) drug therapy: Secondary | ICD-10-CM | POA: Diagnosis not present

## 2019-04-10 DIAGNOSIS — J449 Chronic obstructive pulmonary disease, unspecified: Secondary | ICD-10-CM | POA: Diagnosis not present

## 2019-04-11 DIAGNOSIS — C3481 Malignant neoplasm of overlapping sites of right bronchus and lung: Secondary | ICD-10-CM | POA: Diagnosis not present

## 2019-04-11 DIAGNOSIS — Z5111 Encounter for antineoplastic chemotherapy: Secondary | ICD-10-CM | POA: Diagnosis not present

## 2019-04-11 DIAGNOSIS — Z79899 Other long term (current) drug therapy: Secondary | ICD-10-CM | POA: Diagnosis not present

## 2019-04-18 DIAGNOSIS — C3481 Malignant neoplasm of overlapping sites of right bronchus and lung: Secondary | ICD-10-CM | POA: Diagnosis not present

## 2019-04-27 DIAGNOSIS — J4540 Moderate persistent asthma, uncomplicated: Secondary | ICD-10-CM | POA: Diagnosis not present

## 2019-04-30 DIAGNOSIS — Z23 Encounter for immunization: Secondary | ICD-10-CM | POA: Diagnosis not present

## 2019-05-02 DIAGNOSIS — Z79899 Other long term (current) drug therapy: Secondary | ICD-10-CM | POA: Diagnosis not present

## 2019-05-02 DIAGNOSIS — C3481 Malignant neoplasm of overlapping sites of right bronchus and lung: Secondary | ICD-10-CM | POA: Diagnosis not present

## 2019-05-06 DIAGNOSIS — J4520 Mild intermittent asthma, uncomplicated: Secondary | ICD-10-CM | POA: Diagnosis not present

## 2019-05-08 DIAGNOSIS — E860 Dehydration: Secondary | ICD-10-CM | POA: Diagnosis not present

## 2019-05-08 DIAGNOSIS — R110 Nausea: Secondary | ICD-10-CM | POA: Diagnosis not present

## 2019-05-08 DIAGNOSIS — Z79899 Other long term (current) drug therapy: Secondary | ICD-10-CM | POA: Diagnosis not present

## 2019-05-08 DIAGNOSIS — C3481 Malignant neoplasm of overlapping sites of right bronchus and lung: Secondary | ICD-10-CM | POA: Diagnosis not present

## 2019-05-23 DIAGNOSIS — C3481 Malignant neoplasm of overlapping sites of right bronchus and lung: Secondary | ICD-10-CM | POA: Diagnosis not present

## 2019-05-27 DIAGNOSIS — J4540 Moderate persistent asthma, uncomplicated: Secondary | ICD-10-CM | POA: Diagnosis not present

## 2019-06-05 DIAGNOSIS — J4520 Mild intermittent asthma, uncomplicated: Secondary | ICD-10-CM | POA: Diagnosis not present

## 2019-06-13 DIAGNOSIS — C3481 Malignant neoplasm of overlapping sites of right bronchus and lung: Secondary | ICD-10-CM | POA: Diagnosis not present

## 2019-06-13 DIAGNOSIS — Z79899 Other long term (current) drug therapy: Secondary | ICD-10-CM | POA: Diagnosis not present

## 2019-06-27 DIAGNOSIS — J4540 Moderate persistent asthma, uncomplicated: Secondary | ICD-10-CM | POA: Diagnosis not present

## 2019-07-03 DIAGNOSIS — C3491 Malignant neoplasm of unspecified part of right bronchus or lung: Secondary | ICD-10-CM | POA: Diagnosis not present

## 2019-07-03 DIAGNOSIS — C3481 Malignant neoplasm of overlapping sites of right bronchus and lung: Secondary | ICD-10-CM | POA: Diagnosis not present

## 2019-07-03 DIAGNOSIS — D649 Anemia, unspecified: Secondary | ICD-10-CM | POA: Diagnosis not present

## 2019-07-06 DIAGNOSIS — J4520 Mild intermittent asthma, uncomplicated: Secondary | ICD-10-CM | POA: Diagnosis not present

## 2019-07-07 DIAGNOSIS — C3481 Malignant neoplasm of overlapping sites of right bronchus and lung: Secondary | ICD-10-CM | POA: Diagnosis not present

## 2019-07-17 DIAGNOSIS — E785 Hyperlipidemia, unspecified: Secondary | ICD-10-CM | POA: Diagnosis not present

## 2019-07-17 DIAGNOSIS — I10 Essential (primary) hypertension: Secondary | ICD-10-CM | POA: Diagnosis not present

## 2019-07-17 DIAGNOSIS — J449 Chronic obstructive pulmonary disease, unspecified: Secondary | ICD-10-CM | POA: Diagnosis not present

## 2019-07-17 DIAGNOSIS — C3490 Malignant neoplasm of unspecified part of unspecified bronchus or lung: Secondary | ICD-10-CM | POA: Diagnosis not present

## 2019-07-27 DIAGNOSIS — J4540 Moderate persistent asthma, uncomplicated: Secondary | ICD-10-CM | POA: Diagnosis not present

## 2019-07-29 DIAGNOSIS — Z79899 Other long term (current) drug therapy: Secondary | ICD-10-CM | POA: Diagnosis not present

## 2019-07-29 DIAGNOSIS — C3481 Malignant neoplasm of overlapping sites of right bronchus and lung: Secondary | ICD-10-CM | POA: Diagnosis not present

## 2019-08-05 DIAGNOSIS — J4520 Mild intermittent asthma, uncomplicated: Secondary | ICD-10-CM | POA: Diagnosis not present

## 2019-08-18 DIAGNOSIS — C3481 Malignant neoplasm of overlapping sites of right bronchus and lung: Secondary | ICD-10-CM | POA: Diagnosis not present

## 2019-08-18 DIAGNOSIS — Z79899 Other long term (current) drug therapy: Secondary | ICD-10-CM | POA: Diagnosis not present

## 2019-08-27 DIAGNOSIS — J4540 Moderate persistent asthma, uncomplicated: Secondary | ICD-10-CM | POA: Diagnosis not present

## 2019-09-05 DIAGNOSIS — J4520 Mild intermittent asthma, uncomplicated: Secondary | ICD-10-CM | POA: Diagnosis not present

## 2019-09-08 DIAGNOSIS — Z79899 Other long term (current) drug therapy: Secondary | ICD-10-CM | POA: Diagnosis not present

## 2019-09-08 DIAGNOSIS — C3481 Malignant neoplasm of overlapping sites of right bronchus and lung: Secondary | ICD-10-CM | POA: Diagnosis not present

## 2019-09-25 DIAGNOSIS — J90 Pleural effusion, not elsewhere classified: Secondary | ICD-10-CM | POA: Diagnosis not present

## 2019-09-25 DIAGNOSIS — I313 Pericardial effusion (noninflammatory): Secondary | ICD-10-CM | POA: Diagnosis not present

## 2019-09-25 DIAGNOSIS — I2510 Atherosclerotic heart disease of native coronary artery without angina pectoris: Secondary | ICD-10-CM | POA: Diagnosis not present

## 2019-09-25 DIAGNOSIS — C3481 Malignant neoplasm of overlapping sites of right bronchus and lung: Secondary | ICD-10-CM | POA: Diagnosis not present

## 2019-09-25 DIAGNOSIS — I700 Atherosclerosis of aorta: Secondary | ICD-10-CM | POA: Diagnosis not present

## 2019-09-27 DIAGNOSIS — J4540 Moderate persistent asthma, uncomplicated: Secondary | ICD-10-CM | POA: Diagnosis not present

## 2019-10-01 DIAGNOSIS — Z79899 Other long term (current) drug therapy: Secondary | ICD-10-CM | POA: Diagnosis not present

## 2019-10-01 DIAGNOSIS — C3481 Malignant neoplasm of overlapping sites of right bronchus and lung: Secondary | ICD-10-CM | POA: Diagnosis not present

## 2019-10-06 DIAGNOSIS — J4520 Mild intermittent asthma, uncomplicated: Secondary | ICD-10-CM | POA: Diagnosis not present

## 2019-10-20 DIAGNOSIS — C3481 Malignant neoplasm of overlapping sites of right bronchus and lung: Secondary | ICD-10-CM | POA: Diagnosis not present

## 2019-10-20 DIAGNOSIS — Z79899 Other long term (current) drug therapy: Secondary | ICD-10-CM | POA: Diagnosis not present

## 2019-10-20 LAB — CREATININE, SERUM: Creatinine: 0.5 (ref <=1.1)

## 2019-10-27 DIAGNOSIS — J4540 Moderate persistent asthma, uncomplicated: Secondary | ICD-10-CM | POA: Diagnosis not present

## 2019-11-02 ENCOUNTER — Encounter: Payer: Self-pay | Admitting: Pharmacist

## 2019-11-02 DIAGNOSIS — C3481 Malignant neoplasm of overlapping sites of right bronchus and lung: Secondary | ICD-10-CM

## 2019-11-05 DIAGNOSIS — J4520 Mild intermittent asthma, uncomplicated: Secondary | ICD-10-CM | POA: Diagnosis not present

## 2019-11-10 ENCOUNTER — Other Ambulatory Visit: Payer: Self-pay | Admitting: Hematology and Oncology

## 2019-11-10 DIAGNOSIS — C3481 Malignant neoplasm of overlapping sites of right bronchus and lung: Secondary | ICD-10-CM | POA: Diagnosis not present

## 2019-11-10 DIAGNOSIS — Z79899 Other long term (current) drug therapy: Secondary | ICD-10-CM | POA: Diagnosis not present

## 2019-11-10 LAB — HEPATIC FUNCTION PANEL
ALT: 13 (ref 7–35)
AST: 21 (ref 13–35)
Alkaline Phosphatase: 75 (ref 25–125)
Bilirubin, Total: 0.4

## 2019-11-10 LAB — COMPREHENSIVE METABOLIC PANEL
Albumin: 4.4 (ref 3.5–5.0)
Calcium: 9.7 (ref 8.7–10.7)

## 2019-11-10 LAB — CBC AND DIFFERENTIAL
HCT: 41 (ref 36–46)
Hemoglobin: 13.6 (ref 12.0–16.0)
Neutrophils Absolute: 5.04
Platelets: 306 (ref 150–399)
WBC: 6.9

## 2019-11-10 LAB — BASIC METABOLIC PANEL
BUN: 10 (ref 4–21)
CO2: 24 — AB (ref 13–22)
Chloride: 107 (ref 99–108)
Creatinine: 0.6 (ref 0.5–1.1)
Glucose: 120
Potassium: 3.4 (ref 3.4–5.3)
Sodium: 141 (ref 137–147)

## 2019-11-10 LAB — CBC: RBC: 4.71 (ref 3.87–5.11)

## 2019-11-12 ENCOUNTER — Inpatient Hospital Stay: Payer: Federal, State, Local not specified - PPO | Attending: Oncology

## 2019-11-12 ENCOUNTER — Other Ambulatory Visit: Payer: Self-pay

## 2019-11-12 VITALS — BP 106/60 | HR 83 | Temp 98.2°F | Resp 18 | Ht 65.0 in | Wt 116.8 lb

## 2019-11-12 DIAGNOSIS — C3481 Malignant neoplasm of overlapping sites of right bronchus and lung: Secondary | ICD-10-CM

## 2019-11-12 DIAGNOSIS — Z5112 Encounter for antineoplastic immunotherapy: Secondary | ICD-10-CM | POA: Insufficient documentation

## 2019-11-12 DIAGNOSIS — C3432 Malignant neoplasm of lower lobe, left bronchus or lung: Secondary | ICD-10-CM | POA: Insufficient documentation

## 2019-11-12 MED ORDER — SODIUM CHLORIDE 0.9 % IV SOLN
200.0000 mg | Freq: Once | INTRAVENOUS | Status: AC
  Administered 2019-11-12: 200 mg via INTRAVENOUS
  Filled 2019-11-12: qty 8

## 2019-11-12 MED ORDER — SODIUM CHLORIDE 0.9 % IV SOLN
Freq: Once | INTRAVENOUS | Status: AC
  Filled 2019-11-12: qty 250

## 2019-11-12 MED ORDER — SODIUM CHLORIDE 0.9% FLUSH
10.0000 mL | INTRAVENOUS | Status: DC | PRN
  Administered 2019-11-12: 10 mL
  Filled 2019-11-12: qty 10

## 2019-11-12 MED ORDER — HEPARIN SOD (PORK) LOCK FLUSH 100 UNIT/ML IV SOLN
500.0000 [IU] | Freq: Once | INTRAVENOUS | Status: AC | PRN
  Administered 2019-11-12: 500 [IU]
  Filled 2019-11-12: qty 5

## 2019-11-12 NOTE — Patient Instructions (Signed)
[facility] Discharge Instructions for Patients Receiving Chemotherapy  Today you received the following chemotherapy agents PEMBROLIZUMAB  To help prevent nausea and vomiting after your treatment, we encourage you to take your nausea medication.   If you develop nausea and vomiting that is not controlled by your nausea medication, call the clinic.   BELOW ARE SYMPTOMS THAT SHOULD BE REPORTED IMMEDIATELY:  *FEVER GREATER THAN 100.5 F  *CHILLS WITH OR WITHOUT FEVER  NAUSEA AND VOMITING THAT IS NOT CONTROLLED WITH YOUR NAUSEA MEDICATION  *UNUSUAL SHORTNESS OF BREATH  *UNUSUAL BRUISING OR BLEEDING  TENDERNESS IN MOUTH AND THROAT WITH OR WITHOUT PRESENCE OF ULCERS  *URINARY PROBLEMS  *BOWEL PROBLEMS  UNUSUAL RASH Items with * indicate a potential emergency and should be followed up as soon as possible.  Feel free to call the clinic should you have any questions or concerns at The clinic phone number is [phone].  Please show the [facility] at check-in to the Emergency Department and triage nurse.

## 2019-11-12 NOTE — Progress Notes (Signed)
PT IS STABLE AT DISCHARGE.

## 2019-11-25 NOTE — Progress Notes (Signed)
PT STABLE AT TIME OF DISCHARGE 

## 2019-11-27 DIAGNOSIS — J4540 Moderate persistent asthma, uncomplicated: Secondary | ICD-10-CM | POA: Diagnosis not present

## 2019-11-30 ENCOUNTER — Other Ambulatory Visit: Payer: Self-pay | Admitting: Oncology

## 2019-11-30 DIAGNOSIS — C3481 Malignant neoplasm of overlapping sites of right bronchus and lung: Secondary | ICD-10-CM

## 2019-11-30 NOTE — Progress Notes (Signed)
[facility]  [address] [place],  [place]  [address] [phone]  Clinic Day:  12/01/2019  Referring physician: Leonides Sake, MD   HISTORY OF PRESENT ILLNESS:  The patient is a 54 y.o. female with stage IVA lung adenocarcinoma.  This staging is based upon initial scans showing bilateral lung involvement with her cancer.  She comes in today to be evaluated before heading into her 8th cycle of of maintenance pembrolizumab.  Her maintenance immunotherapy was preceded by 4 cycles of carboplatin/Alimta/pembrolizumab.  The patient claims to have tolerated her 7th cycle of maintenance immunotherapy very well.  She gets winded sometimes with exertion.  Otherwise, she denies having any respiratory symptoms which concern her for overt signs of disease progression.  She continues to work on a daily basis.   PHYSICAL EXAM:  Blood pressure 113/66, pulse 85, resp. rate 16, height 5\' 5"  (1.651 m), weight 115 lb 11.2 oz (52.5 kg), SpO2 96 %. Wt Readings from Last 3 Encounters:  12/01/19 115 lb 11.2 oz (52.5 kg)  11/25/19 115 lb 3 oz (52.2 kg)  11/20/19 116 lb 12 oz (53 kg)   Body mass index is 19.25 kg/m. Performance status (ECOG): 1 - Symptomatic but completely ambulatory Physical Exam Constitutional:      Appearance: Normal appearance. She is not ill-appearing.  HENT:     Mouth/Throat:     Mouth: Mucous membranes are moist.     Pharynx: Oropharynx is clear. No oropharyngeal exudate or posterior oropharyngeal erythema.  Cardiovascular:     Rate and Rhythm: Normal rate and regular rhythm.     Heart sounds: No murmur heard.  No friction rub. No gallop.   Pulmonary:     Effort: Pulmonary effort is normal. No respiratory distress.     Breath sounds: Normal breath sounds. No wheezing, rhonchi or rales.  Abdominal:     General: Bowel sounds are normal. There is no distension.     Palpations: Abdomen is soft. There is no mass.     Tenderness: There is no  abdominal tenderness.  Musculoskeletal:        General: No swelling.     Right lower leg: No edema.     Left lower leg: No edema.  Lymphadenopathy:     Cervical: No cervical adenopathy.     Upper Body:     Right upper body: No supraclavicular or axillary adenopathy.     Left upper body: No supraclavicular or axillary adenopathy.     Lower Body: No right inguinal adenopathy. No left inguinal adenopathy.  Skin:    General: Skin is warm.     Coloration: Skin is not jaundiced.     Findings: No lesion or rash.  Neurological:     General: No focal deficit present.     Mental Status: She is alert and oriented to person, place, and time. Mental status is at baseline.     Cranial Nerves: Cranial nerves are intact.  Psychiatric:        Mood and Affect: Mood normal.        Behavior: Behavior normal.        Thought Content: Thought content normal.     LABS:     ASSESSMENT & PLAN:   Assessment/Plan:  A 54 y.o. female with stage IVA lung adenocarcinoma.  She will proceed with her 8th cycle of maintenance pembrolizumab tomorrow.  Clinically, the patient is doing very well.  I will see her back in 3 weeks  before she heads into her 9th cycle maintenance pembrolizumab immunotherapy.   A chest CT will be done at her next visit to ascertain her new disease baseline after 8 cycles of maintenance immunotherapy.  The patient understands all the plans discussed today and is in agreement with them.      Jalei Shibley Macarthur Critchley, MD

## 2019-12-01 ENCOUNTER — Encounter: Payer: Self-pay | Admitting: Oncology

## 2019-12-01 ENCOUNTER — Inpatient Hospital Stay: Payer: Federal, State, Local not specified - PPO | Attending: Oncology

## 2019-12-01 ENCOUNTER — Inpatient Hospital Stay (HOSPITAL_BASED_OUTPATIENT_CLINIC_OR_DEPARTMENT_OTHER): Payer: Federal, State, Local not specified - PPO | Admitting: Oncology

## 2019-12-01 ENCOUNTER — Other Ambulatory Visit: Payer: Self-pay

## 2019-12-01 ENCOUNTER — Other Ambulatory Visit: Payer: Self-pay | Admitting: Oncology

## 2019-12-01 VITALS — BP 113/66 | HR 85 | Resp 16 | Ht 65.0 in | Wt 115.7 lb

## 2019-12-01 DIAGNOSIS — C3432 Malignant neoplasm of lower lobe, left bronchus or lung: Secondary | ICD-10-CM | POA: Diagnosis not present

## 2019-12-01 DIAGNOSIS — C3481 Malignant neoplasm of overlapping sites of right bronchus and lung: Secondary | ICD-10-CM

## 2019-12-01 DIAGNOSIS — Z5112 Encounter for antineoplastic immunotherapy: Secondary | ICD-10-CM | POA: Insufficient documentation

## 2019-12-01 DIAGNOSIS — Z0001 Encounter for general adult medical examination with abnormal findings: Secondary | ICD-10-CM | POA: Diagnosis not present

## 2019-12-01 DIAGNOSIS — Z79899 Other long term (current) drug therapy: Secondary | ICD-10-CM | POA: Insufficient documentation

## 2019-12-01 LAB — HEPATIC FUNCTION PANEL
ALT: 16 (ref 7–35)
AST: 26 (ref 13–35)
Alkaline Phosphatase: 70 (ref 25–125)
Bilirubin, Total: 0.3

## 2019-12-01 LAB — BASIC METABOLIC PANEL
BUN: 9 (ref 4–21)
CO2: 27 — AB (ref 13–22)
Chloride: 108 (ref 99–108)
Creatinine: 0.6 (ref 0.5–1.1)
Glucose: 84
Potassium: 3.6 (ref 3.4–5.3)
Sodium: 142 (ref 137–147)

## 2019-12-01 LAB — CBC AND DIFFERENTIAL
HCT: 41 (ref 36–46)
Hemoglobin: 13.4 (ref 12.0–16.0)
Neutrophils Absolute: 6.93
Platelets: 300 (ref 150–399)
WBC: 9

## 2019-12-01 LAB — COMPREHENSIVE METABOLIC PANEL
Albumin: 4.4 (ref 3.5–5.0)
Calcium: 9.4 (ref 8.7–10.7)

## 2019-12-01 LAB — CBC: RBC: 4.58 (ref 3.87–5.11)

## 2019-12-01 LAB — TSH: TSH: 3.134 u[IU]/mL (ref 0.350–4.500)

## 2019-12-02 LAB — T4: T4, Total: 7.9 ug/dL (ref 4.5–12.0)

## 2019-12-03 ENCOUNTER — Telehealth: Payer: Self-pay | Admitting: Oncology

## 2019-12-03 ENCOUNTER — Other Ambulatory Visit: Payer: Self-pay

## 2019-12-03 ENCOUNTER — Inpatient Hospital Stay: Payer: Federal, State, Local not specified - PPO

## 2019-12-03 VITALS — BP 104/78 | HR 75 | Temp 98.2°F | Resp 18 | Ht 65.0 in | Wt 115.0 lb

## 2019-12-03 DIAGNOSIS — C3481 Malignant neoplasm of overlapping sites of right bronchus and lung: Secondary | ICD-10-CM | POA: Diagnosis not present

## 2019-12-03 DIAGNOSIS — Z79899 Other long term (current) drug therapy: Secondary | ICD-10-CM | POA: Diagnosis not present

## 2019-12-03 DIAGNOSIS — C3432 Malignant neoplasm of lower lobe, left bronchus or lung: Secondary | ICD-10-CM | POA: Diagnosis not present

## 2019-12-03 DIAGNOSIS — Z5112 Encounter for antineoplastic immunotherapy: Secondary | ICD-10-CM | POA: Diagnosis not present

## 2019-12-03 MED ORDER — SODIUM CHLORIDE 0.9 % IV SOLN
200.0000 mg | Freq: Once | INTRAVENOUS | Status: AC
  Administered 2019-12-03: 200 mg via INTRAVENOUS
  Filled 2019-12-03: qty 8

## 2019-12-03 MED ORDER — SODIUM CHLORIDE 0.9% FLUSH
10.0000 mL | INTRAVENOUS | Status: DC | PRN
  Administered 2019-12-03: 10 mL
  Filled 2019-12-03: qty 10

## 2019-12-03 MED ORDER — HEPARIN SOD (PORK) LOCK FLUSH 100 UNIT/ML IV SOLN
500.0000 [IU] | Freq: Once | INTRAVENOUS | Status: AC | PRN
  Administered 2019-12-03: 500 [IU]
  Filled 2019-12-03: qty 5

## 2019-12-03 MED ORDER — SODIUM CHLORIDE 0.9 % IV SOLN
Freq: Once | INTRAVENOUS | Status: AC
Start: 2019-12-03 — End: 2019-12-03
  Filled 2019-12-03: qty 250

## 2019-12-03 NOTE — Patient Instructions (Signed)
[facility] Discharge Instructions for Patients Receiving Chemotherapy  Today you received the following chemotherapy agents Pembrolizomab  To help prevent nausea and vomiting after your treatment, we encourage you to take your nausea medication.   If you develop nausea and vomiting that is not controlled by your nausea medication, call the clinic.   BELOW ARE SYMPTOMS THAT SHOULD BE REPORTED IMMEDIATELY:  *FEVER GREATER THAN 100.5 F  *CHILLS WITH OR WITHOUT FEVER  NAUSEA AND VOMITING THAT IS NOT CONTROLLED WITH YOUR NAUSEA MEDICATION  *UNUSUAL SHORTNESS OF BREATH  *UNUSUAL BRUISING OR BLEEDING  TENDERNESS IN MOUTH AND THROAT WITH OR WITHOUT PRESENCE OF ULCERS  *URINARY PROBLEMS  *BOWEL PROBLEMS  UNUSUAL RASH Items with * indicate a potential emergency and should be followed up as soon as possible.  Feel free to call the clinic should you have any questions or concerns at The clinic phone number is [phone].  Please show the [facility] at check-in to the Emergency Department and triage nurse.

## 2019-12-03 NOTE — Progress Notes (Signed)
PT STABLE AT TIME OF DISCHARGE 

## 2019-12-06 DIAGNOSIS — J4520 Mild intermittent asthma, uncomplicated: Secondary | ICD-10-CM | POA: Diagnosis not present

## 2019-12-11 ENCOUNTER — Other Ambulatory Visit: Payer: Self-pay | Admitting: Oncology

## 2019-12-16 DIAGNOSIS — C3481 Malignant neoplasm of overlapping sites of right bronchus and lung: Secondary | ICD-10-CM | POA: Diagnosis not present

## 2019-12-16 DIAGNOSIS — I313 Pericardial effusion (noninflammatory): Secondary | ICD-10-CM | POA: Diagnosis not present

## 2019-12-16 DIAGNOSIS — I2510 Atherosclerotic heart disease of native coronary artery without angina pectoris: Secondary | ICD-10-CM | POA: Diagnosis not present

## 2019-12-16 DIAGNOSIS — J90 Pleural effusion, not elsewhere classified: Secondary | ICD-10-CM | POA: Diagnosis not present

## 2019-12-16 DIAGNOSIS — C3490 Malignant neoplasm of unspecified part of unspecified bronchus or lung: Secondary | ICD-10-CM | POA: Diagnosis not present

## 2019-12-17 NOTE — Progress Notes (Signed)
PT STABLE AT TIME OF DISCHARGE 

## 2019-12-21 NOTE — Progress Notes (Addendum)
[facility]  [address] [place],  [place]  [address] [phone]  Clinic Day:  12/22/2019  Referring physician: Leonides Sake, MD   HISTORY OF PRESENT ILLNESS:  The patient is a 54 y.o. female with stage IVA lung adenocarcinoma.  This staging is based upon initial scans showing bilateral lung involvement with her cancer.  She comes in today to go over her chest CT to ascertain her new disease baseline after receiving 8 cycles of maintenance pembrolizumab.  Her maintenance immunotherapy was preceded by 4 cycles of carboplatin/Alimta/pembrolizumab.  The patient claims to have tolerated her 8th cycle of maintenance immunotherapy very well.  She still gets winded occasionally with exertion.  Otherwise, she denies having any new respiratory symptoms which concern her for overt signs of disease progression.  She continues to work on a daily basis.   PHYSICAL EXAM:  There were no vitals taken for this visit. Wt Readings from Last 3 Encounters:  11/10/19 115 lb 3 oz (52.2 kg)  12/03/19 115 lb (52.2 kg)  12/01/19 115 lb 11.2 oz (52.5 kg)   There is no height or weight on file to calculate BMI. Performance status (ECOG): 1 - Symptomatic but completely ambulatory Physical Exam Constitutional:      Appearance: Normal appearance. She is not ill-appearing.  HENT:     Mouth/Throat:     Mouth: Mucous membranes are moist.     Pharynx: Oropharynx is clear. No oropharyngeal exudate or posterior oropharyngeal erythema.  Cardiovascular:     Rate and Rhythm: Normal rate and regular rhythm.     Heart sounds: No murmur heard.  No friction rub. No gallop.   Pulmonary:     Effort: Pulmonary effort is normal. No respiratory distress.     Breath sounds: Normal breath sounds. No wheezing, rhonchi or rales.  Abdominal:     General: Bowel sounds are normal. There is no distension.     Palpations: Abdomen is soft. There is no mass.     Tenderness: There is no  abdominal tenderness.  Musculoskeletal:        General: No swelling.     Right lower leg: No edema.     Left lower leg: No edema.  Lymphadenopathy:     Cervical: No cervical adenopathy.     Upper Body:     Right upper body: No supraclavicular or axillary adenopathy.     Left upper body: No supraclavicular or axillary adenopathy.     Lower Body: No right inguinal adenopathy. No left inguinal adenopathy.  Skin:    General: Skin is warm.     Coloration: Skin is not jaundiced.     Findings: No lesion or rash.  Neurological:     General: No focal deficit present.     Mental Status: She is alert and oriented to person, place, and time. Mental status is at baseline.     Cranial Nerves: Cranial nerves are intact.  Psychiatric:        Mood and Affect: Mood normal.        Behavior: Behavior normal.        Thought Content: Thought content normal.   SCANS:  Her chest CT revealed the following: FINDINGS: Cardiovascular: Right chest port catheter. Aortic atherosclerosis. Normal heart size. Three-vessel coronary artery calcifications and/or stents. Unchanged trace pericardial effusion.  Mediastinum/Nodes: No significant change in numerous enlarged and stranded appearing mediastinal lymph nodes, largest left paratracheal or AP window node measuring 1.7 x 1.3 cm (series  2, image 42). Thyroid gland, trachea, and esophagus demonstrate no significant findings.  Lungs/Pleura: Moderate centrilobular emphysema. Diffuse bilateral bronchial wall thickening. Unchanged post treatment soft tissue thickening about the right hilum and infrahilar bronchi without discrete mass appreciated (series 2, image 70). There is improved aeration of the inferior right lower lobe and right middle lobe, with minimal residual irregular opacity and septal thickening. There are multiple small pulmonary nodules of the right lower lobe and lateral segment right middle lobe, unchanged compared to prior examination, an  index nodule of the anterior segment right lower lobe measuring 5 mm (series 4, image 96). Unchanged trace right pleural effusion.  Upper Abdomen: No acute abnormality.  Musculoskeletal: No chest wall mass or suspicious bone lesions identified.  IMPRESSION: 1. Unchanged post treatment soft tissue thickening about the right hilum and infrahilar bronchi without discrete mass appreciated. There is improved aeration of the inferior right lower lobe and right middle lobe, with minimal residual irregular opacity and septal thickening. Findings are consistent with sustained treatment response. 2. No significant change in numerous enlarged and stranded appearing mediastinal lymph nodes. 3. There are multiple small pulmonary nodules of the right lower lobe and lateral segment right middle lobe, unchanged compared to prior examination, generally nonspecific. Attention on follow-up. 4. Unchanged trace right pleural effusion. 5. Unchanged trace pericardial effusion 6. Coronary artery disease.  Aortic Atherosclerosis (ICD10-I70.0) and Emphysema (ICD10-J43.9).  ASSESSMENT & PLAN:  Assessment/Plan:  A 54 y.o. female with stage IVA lung adenocarcinoma.  In clinic today, I went over her recent CT scans with her, which continue to show a sustained treatment response from her immunotherapy.  She will proceed with her 9th cycle of maintenance pembrolizumab this week.  Clinically, the patient is doing very well.  I will see her back in 3 weeks before she heads into her 10th cycle maintenance pembrolizumab immunotherapy.   The patient understands all the plans discussed today and is in agreement with them.    Ashir Kunz Macarthur Critchley, MD

## 2019-12-22 ENCOUNTER — Other Ambulatory Visit: Payer: Self-pay

## 2019-12-22 ENCOUNTER — Inpatient Hospital Stay (HOSPITAL_BASED_OUTPATIENT_CLINIC_OR_DEPARTMENT_OTHER): Payer: Federal, State, Local not specified - PPO | Admitting: Oncology

## 2019-12-22 ENCOUNTER — Other Ambulatory Visit: Payer: Self-pay | Admitting: Oncology

## 2019-12-22 ENCOUNTER — Inpatient Hospital Stay: Payer: Federal, State, Local not specified - PPO

## 2019-12-22 DIAGNOSIS — Z0001 Encounter for general adult medical examination with abnormal findings: Secondary | ICD-10-CM | POA: Diagnosis not present

## 2019-12-22 DIAGNOSIS — C3481 Malignant neoplasm of overlapping sites of right bronchus and lung: Secondary | ICD-10-CM | POA: Diagnosis not present

## 2019-12-22 DIAGNOSIS — Z79899 Other long term (current) drug therapy: Secondary | ICD-10-CM | POA: Diagnosis not present

## 2019-12-22 DIAGNOSIS — Z5112 Encounter for antineoplastic immunotherapy: Secondary | ICD-10-CM | POA: Diagnosis not present

## 2019-12-22 DIAGNOSIS — C3432 Malignant neoplasm of lower lobe, left bronchus or lung: Secondary | ICD-10-CM | POA: Diagnosis not present

## 2019-12-22 LAB — TSH: TSH: 4.608 u[IU]/mL — ABNORMAL HIGH (ref 0.350–4.500)

## 2019-12-22 LAB — BASIC METABOLIC PANEL
BUN: 9 (ref 4–21)
CO2: 27 — AB (ref 13–22)
Chloride: 103 (ref 99–108)
Creatinine: 0.5 (ref 0.5–1.1)
Glucose: 81
Potassium: 4.3 (ref 3.4–5.3)
Sodium: 140 (ref 137–147)

## 2019-12-22 LAB — HEPATIC FUNCTION PANEL
ALT: 16 (ref 7–35)
AST: 25 (ref 13–35)
Alkaline Phosphatase: 73 (ref 25–125)
Bilirubin, Total: 0.5

## 2019-12-22 LAB — CBC AND DIFFERENTIAL
HCT: 39 (ref 36–46)
Hemoglobin: 12.8 (ref 12.0–16.0)
Neutrophils Absolute: 4.55
Platelets: 312 (ref 150–399)
WBC: 6.9

## 2019-12-22 LAB — CBC: RBC: 4.27 (ref 3.87–5.11)

## 2019-12-22 LAB — COMPREHENSIVE METABOLIC PANEL
Albumin: 4.2 (ref 3.5–5.0)
Calcium: 9.4 (ref 8.7–10.7)

## 2019-12-23 LAB — T4: T4, Total: 7.8 ug/dL (ref 4.5–12.0)

## 2019-12-23 NOTE — Telephone Encounter (Signed)
Next chemo appts given to patient

## 2019-12-24 ENCOUNTER — Inpatient Hospital Stay: Payer: Federal, State, Local not specified - PPO | Attending: Oncology

## 2019-12-24 ENCOUNTER — Other Ambulatory Visit: Payer: Self-pay

## 2019-12-24 VITALS — BP 116/67 | HR 70 | Temp 98.0°F | Resp 18 | Ht 65.0 in | Wt 118.0 lb

## 2019-12-24 DIAGNOSIS — Z5112 Encounter for antineoplastic immunotherapy: Secondary | ICD-10-CM | POA: Diagnosis not present

## 2019-12-24 DIAGNOSIS — C3481 Malignant neoplasm of overlapping sites of right bronchus and lung: Secondary | ICD-10-CM | POA: Diagnosis not present

## 2019-12-24 DIAGNOSIS — Z79899 Other long term (current) drug therapy: Secondary | ICD-10-CM | POA: Insufficient documentation

## 2019-12-24 DIAGNOSIS — C3432 Malignant neoplasm of lower lobe, left bronchus or lung: Secondary | ICD-10-CM | POA: Insufficient documentation

## 2019-12-24 MED ORDER — HEPARIN SOD (PORK) LOCK FLUSH 100 UNIT/ML IV SOLN
500.0000 [IU] | Freq: Once | INTRAVENOUS | Status: AC | PRN
  Administered 2019-12-24: 500 [IU]
  Filled 2019-12-24: qty 5

## 2019-12-24 MED ORDER — SODIUM CHLORIDE 0.9 % IV SOLN
Freq: Once | INTRAVENOUS | Status: AC
  Filled 2019-12-24: qty 250

## 2019-12-24 MED ORDER — SODIUM CHLORIDE 0.9 % IV SOLN
200.0000 mg | Freq: Once | INTRAVENOUS | Status: AC
  Administered 2019-12-24: 200 mg via INTRAVENOUS
  Filled 2019-12-24: qty 8

## 2019-12-24 NOTE — Patient Instructions (Signed)
[facility] Discharge Instructions for Patients Receiving Chemotherapy  Today you received the following chemotherapy agents Pembrolizumab  To help prevent nausea and vomiting after your treatment, we encourage you to take your nausea medication. If you develop nausea and vomiting that is not controlled by your nausea medication, call the clinic.   BELOW ARE SYMPTOMS THAT SHOULD BE REPORTED IMMEDIATELY:  *FEVER GREATER THAN 100.5 F  *CHILLS WITH OR WITHOUT FEVER  NAUSEA AND VOMITING THAT IS NOT CONTROLLED WITH YOUR NAUSEA MEDICATION  *UNUSUAL SHORTNESS OF BREATH  *UNUSUAL BRUISING OR BLEEDING  TENDERNESS IN MOUTH AND THROAT WITH OR WITHOUT PRESENCE OF ULCERS  *URINARY PROBLEMS  *BOWEL PROBLEMS  UNUSUAL RASH Items with * indicate a potential emergency and should be followed up as soon as possible.  Feel free to call the clinic should you have any questions or concerns at The clinic phone number is [phone].  Please show the [facility] at check-in to the Emergency Department and triage nurse.

## 2019-12-24 NOTE — Progress Notes (Unsigned)
Faxed in reenrollment to [facility] for co-pay card for [place] for 2022.

## 2019-12-24 NOTE — Progress Notes (Signed)
Pt stable at time of discharge ([id number]).

## 2019-12-27 DIAGNOSIS — J4540 Moderate persistent asthma, uncomplicated: Secondary | ICD-10-CM | POA: Diagnosis not present

## 2019-12-29 NOTE — Progress Notes (Signed)
[employer] called, they needed patient to sign paperwork for [place] co-pay card. Met with patient today, will fax in information.

## 2020-01-05 DIAGNOSIS — J4520 Mild intermittent asthma, uncomplicated: Secondary | ICD-10-CM | POA: Diagnosis not present

## 2020-01-06 NOTE — Progress Notes (Signed)
PT STABLE AT TIME OF DISCHARGE 

## 2020-01-11 ENCOUNTER — Other Ambulatory Visit: Payer: Self-pay | Admitting: Oncology

## 2020-01-11 NOTE — Progress Notes (Signed)
[facility]  [address] [place],  [place]  [address] [phone]  Clinic Day:  01/12/2020  Referring physician: Leonides Sake, MD   HISTORY OF PRESENT ILLNESS:  The patient is a 54 y.o. female with stage IVA lung adenocarcinoma.  This staging is based upon initial scans showing bilateral lung involvement with her cancer.  She comes in today to be evaluated before heading into her 10th cycle of maintenance pembrolizumab.  Her maintenance immunotherapy was preceded by 4 cycles of carboplatin/Alimta/pembrolizumab.  The patient claims to have tolerated her 9th cycle of maintenance immunotherapy very well.  Recent CT scans showed a continued positive response to his immunotherapy.  She still gets winded occasionally with exertion.  Otherwise, she denies having any new respiratory symptoms which concern her for overt signs of disease progression.  She continues to work on a daily basis.  PHYSICAL EXAM:  Blood pressure 112/62, pulse 82, temperature 98.5 F (36.9 C), resp. rate 14, height 5\' 5"  (1.651 m), weight 114 lb (51.7 kg), SpO2 99 %. Wt Readings from Last 3 Encounters:  01/12/20 114 lb (51.7 kg)  11/10/19 115 lb 0.3 oz (52.2 kg)  12/24/19 118 lb (53.5 kg)   Body mass index is 18.97 kg/m. Performance status (ECOG): 1 - Symptomatic but completely ambulatory Physical Exam Constitutional:      Appearance: Normal appearance. She is not ill-appearing.  HENT:     Mouth/Throat:     Mouth: Mucous membranes are moist.     Pharynx: Oropharynx is clear. No oropharyngeal exudate or posterior oropharyngeal erythema.  Cardiovascular:     Rate and Rhythm: Normal rate and regular rhythm.     Heart sounds: No murmur heard. No friction rub. No gallop.   Pulmonary:     Effort: Pulmonary effort is normal. No respiratory distress.     Breath sounds: Normal breath sounds. No wheezing, rhonchi or rales.  Chest:  Breasts:     Right: No axillary adenopathy or  supraclavicular adenopathy.     Left: No axillary adenopathy or supraclavicular adenopathy.    Abdominal:     General: Bowel sounds are normal. There is no distension.     Palpations: Abdomen is soft. There is no mass.     Tenderness: There is no abdominal tenderness.  Musculoskeletal:        General: No swelling.     Right lower leg: No edema.     Left lower leg: No edema.  Lymphadenopathy:     Cervical: No cervical adenopathy.     Upper Body:     Right upper body: No supraclavicular or axillary adenopathy.     Left upper body: No supraclavicular or axillary adenopathy.     Lower Body: No right inguinal adenopathy. No left inguinal adenopathy.  Skin:    General: Skin is warm.     Coloration: Skin is not jaundiced.     Findings: No lesion or rash.  Neurological:     General: No focal deficit present.     Mental Status: She is alert and oriented to person, place, and time. Mental status is at baseline.     Cranial Nerves: Cranial nerves are intact.  Psychiatric:        Mood and Affect: Mood normal.        Behavior: Behavior normal.        Thought Content: Thought content normal.   LABS:    Ref. Range 01/12/2020 00:00  Sodium Latest Ref Range: 137 -  147  140  Potassium Latest Ref Range: 3.4 - 5.3  4.1  Chloride Latest Ref Range: 99 - 108  106  CO2 Latest Ref Range: 13 - 22  27 (A)  Glucose Unknown 117  BUN Latest Ref Range: 4 - 21  10  Creatinine Latest Ref Range: 0.5 - 1.1  0.5  Calcium Latest Ref Range: 8.7 - 10.7  9.5  Alkaline Phosphatase Latest Ref Range: 25 - 125  61  Albumin Latest Ref Range: 3.5 - 5.0  4.5  AST Latest Ref Range: 13 - 35  23  ALT Latest Ref Range: 7 - 35  17  Bilirubin, Total Unknown 0.5  WBC Unknown 6.7  RBC Latest Ref Range: 3.87 - 5.11  4.4  Hemoglobin Latest Ref Range: 12.0 - 16.0  13.4  HCT Latest Ref Range: 36 - 46  40  Platelets Latest Ref Range: 150 - 399  298  NEUT# Unknown 4.82    Ref. Range 12/22/2019 09:15  TSH Latest Ref Range:  0.350 - 4.500 uIU/mL 4.608 (H)  Thyroxine (T4) Latest Ref Range: 4.5 - 12.0 ug/dL 7.8    ASSESSMENT & PLAN:  Assessment/Plan:  A 54 y.o. female with stage IVA lung adenocarcinoma.  She will proceed with her 10th cycle of maintenance pembrolizumab this week.  Clinically, the patient is doing very well.  I will see her back in 3 weeks before she heads into her 11th cycle maintenance pembrolizumab immunotherapy.   The patient understands all the plans discussed today and is in agreement with them.    Catrena Vari Macarthur Critchley, MD

## 2020-01-12 ENCOUNTER — Telehealth: Payer: Self-pay | Admitting: Oncology

## 2020-01-12 ENCOUNTER — Other Ambulatory Visit: Payer: Self-pay | Admitting: Hematology and Oncology

## 2020-01-12 ENCOUNTER — Other Ambulatory Visit: Payer: Self-pay

## 2020-01-12 ENCOUNTER — Other Ambulatory Visit: Payer: Self-pay | Admitting: Oncology

## 2020-01-12 ENCOUNTER — Inpatient Hospital Stay (INDEPENDENT_AMBULATORY_CARE_PROVIDER_SITE_OTHER): Payer: Federal, State, Local not specified - PPO | Admitting: Oncology

## 2020-01-12 ENCOUNTER — Inpatient Hospital Stay: Payer: Federal, State, Local not specified - PPO

## 2020-01-12 VITALS — BP 112/62 | HR 82 | Temp 98.5°F | Resp 14 | Ht 65.0 in | Wt 114.0 lb

## 2020-01-12 DIAGNOSIS — C3481 Malignant neoplasm of overlapping sites of right bronchus and lung: Secondary | ICD-10-CM

## 2020-01-12 DIAGNOSIS — C3432 Malignant neoplasm of lower lobe, left bronchus or lung: Secondary | ICD-10-CM | POA: Diagnosis not present

## 2020-01-12 DIAGNOSIS — Z0001 Encounter for general adult medical examination with abnormal findings: Secondary | ICD-10-CM | POA: Diagnosis not present

## 2020-01-12 DIAGNOSIS — Z79899 Other long term (current) drug therapy: Secondary | ICD-10-CM | POA: Diagnosis not present

## 2020-01-12 DIAGNOSIS — Z5112 Encounter for antineoplastic immunotherapy: Secondary | ICD-10-CM | POA: Diagnosis not present

## 2020-01-12 LAB — TSH: TSH: 1.884 u[IU]/mL (ref 0.350–4.500)

## 2020-01-12 LAB — BASIC METABOLIC PANEL
BUN: 10 (ref 4–21)
CO2: 27 — AB (ref 13–22)
Chloride: 106 (ref 99–108)
Creatinine: 0.5 (ref 0.5–1.1)
Glucose: 117
Potassium: 4.1 (ref 3.4–5.3)
Sodium: 140 (ref 137–147)

## 2020-01-12 LAB — CBC AND DIFFERENTIAL
HCT: 40 (ref 36–46)
Hemoglobin: 13.4 (ref 12.0–16.0)
Neutrophils Absolute: 4.82
Platelets: 298 (ref 150–399)
WBC: 6.7

## 2020-01-12 LAB — HEPATIC FUNCTION PANEL
ALT: 17 (ref 7–35)
AST: 23 (ref 13–35)
Alkaline Phosphatase: 61 (ref 25–125)
Bilirubin, Total: 0.5

## 2020-01-12 LAB — COMPREHENSIVE METABOLIC PANEL
Albumin: 4.5 (ref 3.5–5.0)
Calcium: 9.5 (ref 8.7–10.7)

## 2020-01-12 LAB — CBC: RBC: 4.4 (ref 3.87–5.11)

## 2020-01-12 NOTE — Telephone Encounter (Signed)
Per 12/20 LOS, Patient scheduled for Jan 2022 Labs, Follow Up, Infusion Appt's.  Patient given Appt Summary

## 2020-01-13 LAB — T4: T4, Total: 8.7 ug/dL (ref 4.5–12.0)

## 2020-01-14 ENCOUNTER — Inpatient Hospital Stay: Payer: Federal, State, Local not specified - PPO

## 2020-01-14 ENCOUNTER — Other Ambulatory Visit: Payer: Self-pay

## 2020-01-14 VITALS — BP 117/71 | HR 89 | Temp 98.8°F | Resp 18 | Ht 65.0 in | Wt 108.2 lb

## 2020-01-14 DIAGNOSIS — Z79899 Other long term (current) drug therapy: Secondary | ICD-10-CM | POA: Diagnosis not present

## 2020-01-14 DIAGNOSIS — C3432 Malignant neoplasm of lower lobe, left bronchus or lung: Secondary | ICD-10-CM | POA: Diagnosis not present

## 2020-01-14 DIAGNOSIS — Z5112 Encounter for antineoplastic immunotherapy: Secondary | ICD-10-CM | POA: Diagnosis not present

## 2020-01-14 DIAGNOSIS — C3481 Malignant neoplasm of overlapping sites of right bronchus and lung: Secondary | ICD-10-CM

## 2020-01-14 MED ORDER — HEPARIN SOD (PORK) LOCK FLUSH 100 UNIT/ML IV SOLN
500.0000 [IU] | Freq: Once | INTRAVENOUS | Status: AC | PRN
  Administered 2020-01-14: 500 [IU]
  Filled 2020-01-14: qty 5

## 2020-01-14 MED ORDER — PEMBROLIZUMAB CHEMO INJECTION 100 MG/4ML
200.0000 mg | Freq: Once | INTRAVENOUS | Status: AC
  Administered 2020-01-14: 200 mg via INTRAVENOUS
  Filled 2020-01-14: qty 8

## 2020-01-14 MED ORDER — SODIUM CHLORIDE 0.9 % IV SOLN
Freq: Once | INTRAVENOUS | Status: AC
  Filled 2020-01-14: qty 250

## 2020-01-14 NOTE — Progress Notes (Signed)
PT STABLE AT TIME OF DISCHARGE 

## 2020-01-14 NOTE — Patient Instructions (Signed)
[facility] Discharge Instructions for Patients Receiving Chemotherapy  Today you received the following chemotherapy agents Pembrolizumab  To help prevent nausea and vomiting after your treatment, we encourage you to take your nausea medication as directed   If you develop nausea and vomiting that is not controlled by your nausea medication, call the clinic.   BELOW ARE SYMPTOMS THAT SHOULD BE REPORTED IMMEDIATELY:  *FEVER GREATER THAN 100.5 F  *CHILLS WITH OR WITHOUT FEVER  NAUSEA AND VOMITING THAT IS NOT CONTROLLED WITH YOUR NAUSEA MEDICATION  *UNUSUAL SHORTNESS OF BREATH  *UNUSUAL BRUISING OR BLEEDING  TENDERNESS IN MOUTH AND THROAT WITH OR WITHOUT PRESENCE OF ULCERS  *URINARY PROBLEMS  *BOWEL PROBLEMS  UNUSUAL RASH Items with * indicate a potential emergency and should be followed up as soon as possible.  Feel free to call the clinic should you have any questions or concerns at The clinic phone number is [phone].  Please show the [facility] at check-in to the Emergency Department and triage nurse.

## 2020-01-19 DIAGNOSIS — Z79899 Other long term (current) drug therapy: Secondary | ICD-10-CM | POA: Diagnosis not present

## 2020-01-19 DIAGNOSIS — C3490 Malignant neoplasm of unspecified part of unspecified bronchus or lung: Secondary | ICD-10-CM | POA: Diagnosis not present

## 2020-01-19 DIAGNOSIS — E785 Hyperlipidemia, unspecified: Secondary | ICD-10-CM | POA: Diagnosis not present

## 2020-01-19 DIAGNOSIS — J449 Chronic obstructive pulmonary disease, unspecified: Secondary | ICD-10-CM | POA: Diagnosis not present

## 2020-01-19 DIAGNOSIS — I10 Essential (primary) hypertension: Secondary | ICD-10-CM | POA: Diagnosis not present

## 2020-01-27 DIAGNOSIS — J4540 Moderate persistent asthma, uncomplicated: Secondary | ICD-10-CM | POA: Diagnosis not present

## 2020-02-01 NOTE — Progress Notes (Unsigned)
[facility]  [address] [place],  [place]  [address] [phone]  Clinic Day:  02/01/2020  Referring physician: Janine Limbo, PA-C   HISTORY OF PRESENT ILLNESS:  The patient is a 55 y.o. female with stage IVA lung adenocarcinoma.  This staging is based upon initial scans showing bilateral lung involvement with her cancer.  She comes in today to be evaluated before heading into her 11th cycle of maintenance pembrolizumab.  Her maintenance immunotherapy was preceded by 4 cycles of carboplatin/Alimta/pembrolizumab.  The patient claims to have tolerated her 10th cycle of maintenance immunotherapy very well.  She denies having any new respiratory symptoms which concern her for overt signs of disease progression.  She continues to work on a daily basis.  PHYSICAL EXAM:  There were no vitals taken for this visit. Wt Readings from Last 3 Encounters:  01/14/20 108 lb 4 oz (49.1 kg)  01/12/20 114 lb (51.7 kg)  11/10/19 115 lb 0.3 oz (52.2 kg)   There is no height or weight on file to calculate BMI. Performance status (ECOG): 1 - Symptomatic but completely ambulatory Physical Exam Constitutional:      Appearance: Normal appearance. She is not ill-appearing.  HENT:     Mouth/Throat:     Mouth: Mucous membranes are moist.     Pharynx: Oropharynx is clear. No oropharyngeal exudate or posterior oropharyngeal erythema.  Cardiovascular:     Rate and Rhythm: Normal rate and regular rhythm.     Heart sounds: No murmur heard. No friction rub. No gallop.   Pulmonary:     Effort: Pulmonary effort is normal. No respiratory distress.     Breath sounds: Normal breath sounds. No wheezing, rhonchi or rales.  Chest:  Breasts:     Right: No axillary adenopathy or supraclavicular adenopathy.     Left: No axillary adenopathy or supraclavicular adenopathy.    Abdominal:     General: Bowel sounds are normal. There is no distension.     Palpations: Abdomen is soft.  There is no mass.     Tenderness: There is no abdominal tenderness.  Musculoskeletal:        General: No swelling.     Right lower leg: No edema.     Left lower leg: No edema.  Lymphadenopathy:     Cervical: No cervical adenopathy.     Upper Body:     Right upper body: No supraclavicular or axillary adenopathy.     Left upper body: No supraclavicular or axillary adenopathy.     Lower Body: No right inguinal adenopathy. No left inguinal adenopathy.  Skin:    General: Skin is warm.     Coloration: Skin is not jaundiced.     Findings: No lesion or rash.  Neurological:     General: No focal deficit present.     Mental Status: She is alert and oriented to person, place, and time. Mental status is at baseline.     Cranial Nerves: Cranial nerves are intact.  Psychiatric:        Mood and Affect: Mood normal.        Behavior: Behavior normal.        Thought Content: Thought content normal.   LABS:    Ref. Range 01/12/2020 00:00  Sodium Latest Ref Range: 137 - 147  140  Potassium Latest Ref Range: 3.4 - 5.3  4.1  Chloride Latest Ref Range: 99 - 108  106  CO2 Latest Ref Range: 13 - 22  27 (A)  Glucose Unknown 117  BUN Latest Ref Range: 4 - 21  10  Creatinine Latest Ref Range: 0.5 - 1.1  0.5  Calcium Latest Ref Range: 8.7 - 10.7  9.5  Alkaline Phosphatase Latest Ref Range: 25 - 125  61  Albumin Latest Ref Range: 3.5 - 5.0  4.5  AST Latest Ref Range: 13 - 35  23  ALT Latest Ref Range: 7 - 35  17  Bilirubin, Total Unknown 0.5  WBC Unknown 6.7  RBC Latest Ref Range: 3.87 - 5.11  4.4  Hemoglobin Latest Ref Range: 12.0 - 16.0  13.4  HCT Latest Ref Range: 36 - 46  40  Platelets Latest Ref Range: 150 - 399  298  NEUT# Unknown 4.82    Ref. Range 12/22/2019 09:15  TSH Latest Ref Range: 0.350 - 4.500 uIU/mL 4.608 (H)  Thyroxine (T4) Latest Ref Range: 4.5 - 12.0 ug/dL 7.8    ASSESSMENT & PLAN:  Assessment/Plan:  A 55 y.o. female with stage IVA lung adenocarcinoma.  She will proceed  with her 111th cycle of maintenance pembrolizumab this week.  Clinically, the patient is doing very well.  I will see her back in 3 weeks before she heads into her 12th cycle maintenance pembrolizumab immunotherapy.   The patient understands all the plans discussed today and is in agreement with them.    Dequincy Macarthur Critchley, MD

## 2020-02-02 ENCOUNTER — Encounter: Payer: Self-pay | Admitting: Hematology and Oncology

## 2020-02-02 ENCOUNTER — Inpatient Hospital Stay: Payer: Federal, State, Local not specified - PPO | Attending: Oncology

## 2020-02-02 ENCOUNTER — Telehealth: Payer: Self-pay | Admitting: Hematology and Oncology

## 2020-02-02 ENCOUNTER — Inpatient Hospital Stay (INDEPENDENT_AMBULATORY_CARE_PROVIDER_SITE_OTHER): Payer: Federal, State, Local not specified - PPO | Admitting: Hematology and Oncology

## 2020-02-02 ENCOUNTER — Other Ambulatory Visit: Payer: Self-pay

## 2020-02-02 VITALS — BP 112/58 | HR 76 | Temp 98.1°F | Resp 16 | Ht 65.0 in | Wt 113.1 lb

## 2020-02-02 DIAGNOSIS — Z79899 Other long term (current) drug therapy: Secondary | ICD-10-CM | POA: Insufficient documentation

## 2020-02-02 DIAGNOSIS — Z5112 Encounter for antineoplastic immunotherapy: Secondary | ICD-10-CM | POA: Diagnosis not present

## 2020-02-02 DIAGNOSIS — Z0001 Encounter for general adult medical examination with abnormal findings: Secondary | ICD-10-CM | POA: Diagnosis not present

## 2020-02-02 DIAGNOSIS — C3481 Malignant neoplasm of overlapping sites of right bronchus and lung: Secondary | ICD-10-CM

## 2020-02-02 DIAGNOSIS — C3432 Malignant neoplasm of lower lobe, left bronchus or lung: Secondary | ICD-10-CM | POA: Insufficient documentation

## 2020-02-02 LAB — HEPATIC FUNCTION PANEL
ALT: 16 (ref 7–35)
AST: 26 (ref 13–35)
Alkaline Phosphatase: 64 (ref 25–125)
Bilirubin, Total: 0.4

## 2020-02-02 LAB — BASIC METABOLIC PANEL
BUN: 10 (ref 4–21)
CO2: 24 — AB (ref 13–22)
Chloride: 106 (ref 99–108)
Creatinine: 0.6 (ref 0.5–1.1)
Glucose: 120
Potassium: 4.1 (ref 3.4–5.3)
Sodium: 138 (ref 137–147)

## 2020-02-02 LAB — CBC
MCV: 92 (ref 81–99)
RBC: 4.29 (ref 3.87–5.11)

## 2020-02-02 LAB — COMPREHENSIVE METABOLIC PANEL
Albumin: 4.3 (ref 3.5–5.0)
Calcium: 9.8 (ref 8.7–10.7)

## 2020-02-02 LAB — CBC AND DIFFERENTIAL
HCT: 40 (ref 36–46)
Hemoglobin: 13.3 (ref 12.0–16.0)
Neutrophils Absolute: 7.16
Platelets: 347 (ref 150–399)
WBC: 9.3

## 2020-02-02 LAB — TSH: TSH: 2.146 u[IU]/mL (ref 0.350–4.500)

## 2020-02-02 NOTE — Progress Notes (Signed)
[facility]  [address] [place],  [place]  [address] [phone]  Clinic Day:  02/02/2020  Referring physician: Janine Limbo, PA-C   CHIEF COMPLAINT:  CC:  Stage IV lung adenocarcinoma  Current Treatment:   Maintenance pembrolizumab   HISTORY OF PRESENT ILLNESS:  Heather Gutierrez is a 55 y.o. female with stage IVA lung adenocarcinoma.  This staging is based upon initial scans showing bilateral lung involvement with her cancer.  She initially received 4 cycles of carboplatin/Alimta/pembrolizumab and then was placed on maintenance pembrolizumab.  INTERVAL HISTORY:  Heather Gutierrez is here today to be evaluated prior to an 11th cycle of maintenance pembrolizumab. She reports having an increased dyspnea after her 10th cycle of pembrolizumab for few days.  She states the dyspnea then returned to baseline.  She had a slight cough during that time as well, which has resolved. She denies fevers or chills. She  denies pain. Her appetite is good. Her weight has decreased 1 pounds over last 3 weeks.  REVIEW OF SYSTEMS:  Review of Systems  Constitutional: Negative for appetite change, chills, fatigue and fever.  HENT:   Negative for lump/mass, mouth sores and sore throat.   Respiratory: Positive for shortness of breath. Negative for cough.   Cardiovascular: Negative for chest pain and leg swelling.  Gastrointestinal: Negative for abdominal pain, constipation, diarrhea, nausea and vomiting.  Genitourinary: Negative for difficulty urinating, dysuria, frequency and hematuria.   Musculoskeletal: Negative for arthralgias, back pain and myalgias.  Skin: Negative for rash.  Neurological: Negative for dizziness, extremity weakness, headaches and numbness.  Psychiatric/Behavioral: Negative for depression. The patient is not nervous/anxious.      VITALS:  Blood pressure (!) 112/58, pulse 76, temperature 98.1 F (36.7 C), resp. rate 16, height 5\' 5"  (1.651 m), weight  113 lb 1.6 oz (51.3 kg), SpO2 99 %.  Wt Readings from Last 3 Encounters:  02/02/20 113 lb 1.6 oz (51.3 kg)  01/14/20 108 lb 4 oz (49.1 kg)  01/12/20 114 lb (51.7 kg)    Body mass index is 18.82 kg/m.  Performance status (ECOG): 1 - Symptomatic but completely ambulatory  PHYSICAL EXAM:  Physical Exam Vitals and nursing note reviewed.  Constitutional:      General: She is not in acute distress.    Appearance: Normal appearance.  Eyes:     General: No scleral icterus.    Extraocular Movements: Extraocular movements intact.     Conjunctiva/sclera: Conjunctivae normal.     Pupils: Pupils are equal, round, and reactive to light.  Cardiovascular:     Rate and Rhythm: Normal rate and regular rhythm.     Heart sounds: Normal heart sounds. No murmur heard. No friction rub. No gallop.   Pulmonary:     Effort: Pulmonary effort is normal. No respiratory distress.     Breath sounds: Normal breath sounds. No stridor. No wheezing, rhonchi or rales.  Abdominal:     General: There is no distension.     Palpations: Abdomen is soft. There is no mass.     Tenderness: There is no abdominal tenderness. There is no guarding.     Hernia: No hernia is present.  Musculoskeletal:     Cervical back: Normal range of motion and neck supple. No tenderness.     Right lower leg: No edema.     Left lower leg: No edema.  Lymphadenopathy:     Cervical: No cervical adenopathy.  Skin:    General: Skin is warm.  Coloration: Skin is not jaundiced.     Findings: No rash.  Neurological:     Mental Status: She is alert and oriented to person, place, and time.     Cranial Nerves: No cranial nerve deficit.  Psychiatric:        Mood and Affect: Mood normal.        Behavior: Behavior normal.   Lymph nodes:   There is no cervical, clavicular, axillary or inguinal lymphadenopathy.   LABS:   CBC Latest Ref Rng & Units 02/02/2020 01/12/2020 12/22/2019  WBC - 9.3 6.7 6.9  Hemoglobin 12.0 - 16.0 13.3 13.4  12.8  Hematocrit 36 - 46 40 40 39  Platelets 150 - 399 347 298 312   CMP Latest Ref Rng & Units 02/02/2020 01/12/2020 12/22/2019  Glucose 70 - 99 mg/dL - - -  BUN 4 - 21 10 10 9   Creatinine 0.5 - 1.1 0.6 0.5 0.5  Sodium 137 - 147 138 140 140  Potassium 3.4 - 5.3 4.1 4.1 4.3  Chloride 99 - 108 106 106 103  CO2 13 - 22 24(A) 27(A) 27(A)  Calcium 8.7 - 10.7 9.8 9.5 9.4  Alkaline Phos 25 - 125 64 61 73  AST 13 - 35 26 23 25   ALT 7 - 35 16 17 16      No results found for: CEA1 / No results found for: CEA1 No results found for: PSA1 No results found for: PJS315 No results found for: CAN125  No results found for: TOTALPROTELP, ALBUMINELP, A1GS, A2GS, BETS, BETA2SER, GAMS, MSPIKE, SPEI No results found for: TIBC, FERRITIN, IRONPCTSAT No results found for: LDH  STUDIES:  No results found.    HISTORY:   Past Medical History:  Diagnosis Date   COPD (chronic obstructive pulmonary disease) ([place])    Headache(784.0)    Hypertension    Malignant neoplasm of overlapping sites of right lung ([place])    Malignant neoplasm of overlapping sites of right lung ([place])    Malignant neoplasm of overlapping sites of right lung ([place])    Malignant neoplasm of overlapping sites of right lung ([place])    Neck pain    Pneumonia    hx of    Past Surgical History:  Procedure Laterality Date   ANTERIOR CERVICAL DECOMP/DISCECTOMY FUSION  05/15/2011   Procedure: ANTERIOR CERVICAL DECOMPRESSION/DISCECTOMY FUSION 1 LEVEL;  Surgeon: Hosie Spangle, MD;  Location: [facility];  Service: Neurosurgery;  Laterality: N/A;  Cervical four-five anterior cervical decompression with fusion plating and bonegraft   APPENDECTOMY     CERVICAL FUSION     X 2   POSTERIOR CERVICAL FUSION/FORAMINOTOMY N/A 01/13/2013   Procedure: CERVICAL FOUR TO SEVEN POSTERIOR CERVICAL FUSION/FORAMINOTOMY LEVEL 3;  Surgeon: Hosie Spangle, MD;  Location: [facility];  Service: Neurosurgery;  Laterality: N/A;  C4-7 posterior  cervical arthrodesis with instrumentation    History reviewed. No pertinent family history.  Social History:  reports that she quit smoking about 8 years ago. She has a 33.00 pack-year smoking history. She has never used smokeless tobacco. She reports current alcohol use of about 4.0 - 5.0 standard drinks of alcohol per week. She reports that she does not use drugs.The patient is alone today.  Allergies: No Known Allergies  Current Medications: Current Outpatient Medications  Medication Sig Dispense Refill   acetylcysteine (MUCOMYST) 20 % nebulizer solution Take 4 mLs by nebulization 3 (three) times daily.     amLODipine (NORVASC) 5 MG tablet Take 5 mg by  mouth daily.     atorvastatin (LIPITOR) 40 MG tablet Take 40 mg by mouth daily.     Cyanocobalamin (VITAMIN B 12) 500 MCG TABS Take by mouth.     cyclobenzaprine (FLEXERIL) 10 MG tablet Take 1 tablet (10 mg total) by mouth 3 (three) times daily as needed for muscle spasms. 60 tablet 1   dexamethasone (DECADRON) 4 MG tablet Take 4 mg by mouth as directed.     fluticasone (FLONASE) 50 MCG/ACT nasal spray Place 2 sprays into both nostrils as directed.     Fluticasone-Umeclidin-Vilant (TRELEGY ELLIPTA) 200-62.5-25 MCG/INH AEPB Inhale into the lungs daily.     folic acid (FOLVITE) 1 MG tablet TAKE 1 TABLET BY MOUTH DAILY 30 tablet 2   HYDROcodone-acetaminophen (NORCO) 7.5-325 MG tablet Take 1 tablet by mouth every 6 (six) hours as needed for moderate pain.     ibuprofen (ADVIL,MOTRIN) 200 MG tablet Take 600 mg by mouth every 4 (four) hours as needed for moderate pain (neck).     levofloxacin (LEVAQUIN) 500 MG tablet Take 500 mg by mouth daily.     losartan (COZAAR) 100 MG tablet Take 100 mg by mouth daily.     montelukast (SINGULAIR) 10 MG tablet Take 10 mg by mouth at bedtime.     ondansetron (ZOFRAN) 4 MG tablet Take 4 mg by mouth every 4 (four) hours as needed for nausea or vomiting.     oxyCODONE-acetaminophen  (PERCOCET/ROXICET) 5-325 MG per tablet Take 1-2 tablets by mouth every 4 (four) hours as needed for moderate pain or severe pain. 75 tablet 0   prochlorperazine (COMPAZINE) 10 MG tablet Take 10 mg by mouth every 6 (six) hours as needed for nausea or vomiting.     No current facility-administered medications for this visit.     ASSESSMENT & PLAN:   Assessment:  Heather Gutierrez is a 55 y.o. female with stage IVA lung adenocarcinoma. Clinically, the patient is doing very well.  She continues to tolerate maintenance pembrolizumab without significant difficulty.  She did have an episode of increased dyspnea after her last cycle, but this may not be related.  Plan:  She will proceed with her 11th cycle of maintenance pembrolizumab this week.  We will see her back in 3 weeks before she heads into her 12th cycle maintenance pembrolizumab immunotherapy.  The patient understands all the plans discussed today and is in agreement with them. She knows to contact our office if she develops recurrent dyspnea or other concerns prior to her next appointment.    Marvia Pickles, PA-C

## 2020-02-02 NOTE — Telephone Encounter (Signed)
Per 1/10 los next appt given to patient

## 2020-02-04 ENCOUNTER — Other Ambulatory Visit: Payer: Self-pay

## 2020-02-04 ENCOUNTER — Inpatient Hospital Stay: Payer: Federal, State, Local not specified - PPO

## 2020-02-04 VITALS — BP 108/63 | HR 72 | Temp 98.1°F | Resp 18 | Ht 65.0 in | Wt 115.8 lb

## 2020-02-04 DIAGNOSIS — Z79899 Other long term (current) drug therapy: Secondary | ICD-10-CM | POA: Diagnosis not present

## 2020-02-04 DIAGNOSIS — C3481 Malignant neoplasm of overlapping sites of right bronchus and lung: Secondary | ICD-10-CM

## 2020-02-04 DIAGNOSIS — C3432 Malignant neoplasm of lower lobe, left bronchus or lung: Secondary | ICD-10-CM | POA: Diagnosis not present

## 2020-02-04 DIAGNOSIS — Z5112 Encounter for antineoplastic immunotherapy: Secondary | ICD-10-CM | POA: Diagnosis not present

## 2020-02-04 LAB — T4: T4, Total: 8.5 ug/dL (ref 4.5–12.0)

## 2020-02-04 MED ORDER — HEPARIN SOD (PORK) LOCK FLUSH 100 UNIT/ML IV SOLN
500.0000 [IU] | Freq: Once | INTRAVENOUS | Status: AC | PRN
  Administered 2020-02-04: 500 [IU]
  Filled 2020-02-04: qty 5

## 2020-02-04 MED ORDER — SODIUM CHLORIDE 0.9 % IV SOLN
200.0000 mg | Freq: Once | INTRAVENOUS | Status: AC
  Administered 2020-02-04: 200 mg via INTRAVENOUS
  Filled 2020-02-04: qty 8

## 2020-02-04 MED ORDER — SODIUM CHLORIDE 0.9 % IV SOLN
Freq: Once | INTRAVENOUS | Status: AC
  Filled 2020-02-04: qty 250

## 2020-02-04 NOTE — Patient Instructions (Signed)
Heather Gutierrez Discharge Instructions for Patients Receiving Chemotherapy  Today you received the following chemotherapy agents pembrolizumab  To help prevent nausea and vomiting after your treatment, we encourage you to take your nausea medication.   If you develop nausea and vomiting that is not controlled by your nausea medication, call the clinic.   BELOW ARE SYMPTOMS THAT SHOULD BE REPORTED IMMEDIATELY:  *FEVER GREATER THAN 100.5 F  *CHILLS WITH OR WITHOUT FEVER  NAUSEA AND VOMITING THAT IS NOT CONTROLLED WITH YOUR NAUSEA MEDICATION  *UNUSUAL SHORTNESS OF BREATH  *UNUSUAL BRUISING OR BLEEDING  TENDERNESS IN MOUTH AND THROAT WITH OR WITHOUT PRESENCE OF ULCERS  *URINARY PROBLEMS  *BOWEL PROBLEMS  UNUSUAL RASH Items with * indicate a potential emergency and should be followed up as soon as possible.  Feel free to call the clinic should you have any questions or concerns at The clinic phone number is [phone].  Please show the [place] at check-in to the Emergency Department and triage nurse.

## 2020-02-05 DIAGNOSIS — J4520 Mild intermittent asthma, uncomplicated: Secondary | ICD-10-CM | POA: Diagnosis not present

## 2020-02-16 NOTE — Progress Notes (Signed)
Patient was approved for the [facility]. 01/24/2020-01/22/2021 ID# [id number]

## 2020-02-18 DIAGNOSIS — R0981 Nasal congestion: Secondary | ICD-10-CM | POA: Diagnosis not present

## 2020-02-18 DIAGNOSIS — J029 Acute pharyngitis, unspecified: Secondary | ICD-10-CM | POA: Diagnosis not present

## 2020-02-18 DIAGNOSIS — Z20828 Contact with and (suspected) exposure to other viral communicable diseases: Secondary | ICD-10-CM | POA: Diagnosis not present

## 2020-02-18 DIAGNOSIS — R051 Acute cough: Secondary | ICD-10-CM | POA: Diagnosis not present

## 2020-02-19 ENCOUNTER — Other Ambulatory Visit: Payer: Self-pay

## 2020-02-19 DIAGNOSIS — R079 Chest pain, unspecified: Secondary | ICD-10-CM

## 2020-02-19 DIAGNOSIS — C3481 Malignant neoplasm of overlapping sites of right bronchus and lung: Secondary | ICD-10-CM

## 2020-02-19 DIAGNOSIS — R0789 Other chest pain: Secondary | ICD-10-CM

## 2020-02-19 MED ORDER — HYDROCODONE-ACETAMINOPHEN 7.5-325 MG PO TABS: 1.0000 | ORAL_TABLET | Freq: Four times a day (QID) | ORAL | 0 refills | Status: DC | PRN

## 2020-02-22 ENCOUNTER — Other Ambulatory Visit: Payer: Self-pay | Admitting: Oncology

## 2020-02-22 DIAGNOSIS — C3481 Malignant neoplasm of overlapping sites of right bronchus and lung: Secondary | ICD-10-CM

## 2020-02-22 NOTE — Progress Notes (Unsigned)
[facility]  [address] [place],  [place]  [address] [phone]  Clinic Day:  02/23/2020  Referring physician: Janine Limbo, PA-C   HISTORY OF PRESENT ILLNESS:  The patient is a 55 y.o. female with stage IVA lung adenocarcinoma.  This staging is based upon initial scans showing bilateral lung involvement with her cancer.  She comes in today to be evaluated before heading into her 12th cycle of maintenance pembrolizumab.  Her maintenance immunotherapy was preceded by 4 cycles of carboplatin/Alimta/pembrolizumab.  The patient claims to have tolerated her 11th cycle of maintenance immunotherapy very well.  She denies having any new respiratory symptoms which concern her for overt signs of disease progression.  She continues to work on a daily basis.  PHYSICAL EXAM:  Blood pressure 116/64, pulse 90, temperature 98.7 F (37.1 C), resp. rate 16, height 5\' 5"  (1.651 m), weight 110 lb 8 oz (50.1 kg), SpO2 95 %. Wt Readings from Last 3 Encounters:  02/23/20 110 lb 8 oz (50.1 kg)  02/04/20 115 lb 12 oz (52.5 kg)  02/02/20 113 lb 1.6 oz (51.3 kg)   Body mass index is 18.39 kg/m. Performance status (ECOG): 1 - Symptomatic but completely ambulatory Physical Exam Constitutional:      Appearance: Normal appearance. She is not ill-appearing.  HENT:     Mouth/Throat:     Mouth: Mucous membranes are moist.     Pharynx: Oropharynx is clear. No oropharyngeal exudate or posterior oropharyngeal erythema.  Cardiovascular:     Rate and Rhythm: Normal rate and regular rhythm.     Heart sounds: No murmur heard. No friction rub. No gallop.   Pulmonary:     Effort: Pulmonary effort is normal. No respiratory distress.     Breath sounds: Normal breath sounds. No wheezing, rhonchi or rales.  Chest:  Breasts:     Right: No axillary adenopathy or supraclavicular adenopathy.     Left: No axillary adenopathy or supraclavicular adenopathy.    Abdominal:     General:  Bowel sounds are normal. There is no distension.     Palpations: Abdomen is soft. There is no mass.     Tenderness: There is no abdominal tenderness.  Musculoskeletal:        General: No swelling.     Right lower leg: No edema.     Left lower leg: No edema.  Lymphadenopathy:     Cervical: No cervical adenopathy.     Upper Body:     Right upper body: No supraclavicular or axillary adenopathy.     Left upper body: No supraclavicular or axillary adenopathy.     Lower Body: No right inguinal adenopathy. No left inguinal adenopathy.  Skin:    General: Skin is warm.     Coloration: Skin is not jaundiced.     Findings: No lesion or rash.  Neurological:     General: No focal deficit present.     Mental Status: She is alert and oriented to person, place, and time. Mental status is at baseline.     Cranial Nerves: Cranial nerves are intact.  Psychiatric:        Mood and Affect: Mood normal.        Behavior: Behavior normal.        Thought Content: Thought content normal.   LABS:     ASSESSMENT & PLAN:  Assessment/Plan:  A 55 y.o. female with stage IVA lung adenocarcinoma.  She will proceed with her 12th cycle of maintenance  pembrolizumab this week.  Clinically, the patient is doing very well.  I will see her back in 3 weeks before she heads into her 13th cycle maintenance pembrolizumab immunotherapy.   Repeat CT scans will be done before her next visit to ascertain her new disease baseline.  The patient understands all the plans discussed today and is in agreement with them.    Terreon Ekholm Macarthur Critchley, MD

## 2020-02-23 ENCOUNTER — Inpatient Hospital Stay: Payer: Federal, State, Local not specified - PPO

## 2020-02-23 ENCOUNTER — Inpatient Hospital Stay: Payer: Federal, State, Local not specified - PPO | Admitting: Oncology

## 2020-02-23 ENCOUNTER — Other Ambulatory Visit: Payer: Self-pay

## 2020-02-23 ENCOUNTER — Other Ambulatory Visit: Payer: Self-pay | Admitting: Hematology and Oncology

## 2020-02-23 ENCOUNTER — Telehealth: Payer: Self-pay | Admitting: Oncology

## 2020-02-23 ENCOUNTER — Other Ambulatory Visit: Payer: Self-pay | Admitting: Oncology

## 2020-02-23 VITALS — BP 116/64 | HR 90 | Temp 98.7°F | Resp 16 | Ht 65.0 in | Wt 110.5 lb

## 2020-02-23 DIAGNOSIS — C3481 Malignant neoplasm of overlapping sites of right bronchus and lung: Secondary | ICD-10-CM

## 2020-02-23 DIAGNOSIS — C3432 Malignant neoplasm of lower lobe, left bronchus or lung: Secondary | ICD-10-CM | POA: Diagnosis not present

## 2020-02-23 DIAGNOSIS — Z5112 Encounter for antineoplastic immunotherapy: Secondary | ICD-10-CM | POA: Diagnosis not present

## 2020-02-23 DIAGNOSIS — Z79899 Other long term (current) drug therapy: Secondary | ICD-10-CM | POA: Diagnosis not present

## 2020-02-23 LAB — COMPREHENSIVE METABOLIC PANEL
Albumin: 4.3 (ref 3.5–5.0)
Calcium: 9.8 (ref 8.7–10.7)

## 2020-02-23 LAB — HEPATIC FUNCTION PANEL
ALT: 15 (ref 7–35)
AST: 22 (ref 13–35)
Alkaline Phosphatase: 71 (ref 25–125)
Bilirubin, Total: 0.4

## 2020-02-23 LAB — CBC
MCV: 91 (ref 81–99)
RBC: 4.33 (ref 3.87–5.11)

## 2020-02-23 LAB — CBC AND DIFFERENTIAL
HCT: 39 (ref 36–46)
Hemoglobin: 13.5 (ref 12.0–16.0)
Neutrophils Absolute: 9.42
Platelets: 428 — AB (ref 150–399)
WBC: 10.7

## 2020-02-23 LAB — BASIC METABOLIC PANEL
BUN: 6 (ref 4–21)
CO2: 30 — AB (ref 13–22)
Chloride: 102 (ref 99–108)
Creatinine: 0.5 (ref 0.5–1.1)
Glucose: 82
Potassium: 4.4 (ref 3.4–5.3)
Sodium: 137 (ref 137–147)

## 2020-02-23 LAB — TSH: TSH: 2.783 u[IU]/mL (ref 0.350–4.500)

## 2020-02-23 NOTE — Telephone Encounter (Signed)
Per 1/31 los next appt ans scans sched and given to patient

## 2020-02-25 ENCOUNTER — Inpatient Hospital Stay: Payer: Federal, State, Local not specified - PPO | Attending: Oncology

## 2020-02-25 ENCOUNTER — Other Ambulatory Visit: Payer: Self-pay

## 2020-02-25 VITALS — BP 136/71 | HR 64 | Temp 97.7°F | Resp 18 | Ht 65.0 in | Wt 113.8 lb

## 2020-02-25 DIAGNOSIS — C3432 Malignant neoplasm of lower lobe, left bronchus or lung: Secondary | ICD-10-CM | POA: Diagnosis not present

## 2020-02-25 DIAGNOSIS — C3481 Malignant neoplasm of overlapping sites of right bronchus and lung: Secondary | ICD-10-CM | POA: Diagnosis not present

## 2020-02-25 DIAGNOSIS — Z5112 Encounter for antineoplastic immunotherapy: Secondary | ICD-10-CM | POA: Insufficient documentation

## 2020-02-25 LAB — T4: T4, Total: 9 ug/dL (ref 4.5–12.0)

## 2020-02-25 MED ORDER — SODIUM CHLORIDE 0.9 % IV SOLN
Freq: Once | INTRAVENOUS | Status: AC
  Filled 2020-02-25: qty 250

## 2020-02-25 MED ORDER — HEPARIN SOD (PORK) LOCK FLUSH 100 UNIT/ML IV SOLN
500.0000 [IU] | Freq: Once | INTRAVENOUS | Status: AC | PRN
  Administered 2020-02-25: 500 [IU]
  Filled 2020-02-25: qty 5

## 2020-02-25 MED ORDER — SODIUM CHLORIDE 0.9 % IV SOLN
200.0000 mg | Freq: Once | INTRAVENOUS | Status: AC
  Administered 2020-02-25: 200 mg via INTRAVENOUS
  Filled 2020-02-25: qty 8

## 2020-02-25 NOTE — Patient Instructions (Addendum)
[facility] Discharge Instructions for Patients Receiving Chemotherapy  Today you received the following chemotherapy agents Pembrolizumab.  To help prevent nausea and vomiting after your treatment, we encourage you to take your nausea medication.   If you develop nausea and vomiting that is not controlled by your nausea medication, call the clinic.   BELOW ARE SYMPTOMS THAT SHOULD BE REPORTED IMMEDIATELY:  *FEVER GREATER THAN 100.5 F  *CHILLS WITH OR WITHOUT FEVER  NAUSEA AND VOMITING THAT IS NOT CONTROLLED WITH YOUR NAUSEA MEDICATION  *UNUSUAL SHORTNESS OF BREATH  *UNUSUAL BRUISING OR BLEEDING  TENDERNESS IN MOUTH AND THROAT WITH OR WITHOUT PRESENCE OF ULCERS  *URINARY PROBLEMS  *BOWEL PROBLEMS  UNUSUAL RASH Items with * indicate a potential emergency and should be followed up as soon as possible.  Feel free to call the clinic should you have any questions or concerns at The clinic phone number is [phone].  Please show the [facility] at check-in to the Emergency Department and triage nurse.                                            Pembrolizumab injection   What is this medicine? PEMBROLIZUMAB (pem broe liz ue mab) is a monoclonal antibody. It is used to treat certain types of cancer. This medicine may be used for other purposes; ask your health care provider or pharmacist if you have questions. COMMON BRAND NAME(S): Keytruda What should I tell my health care provider before I take this medicine? They need to know if you have any of these conditions:  autoimmune diseases like Crohn's disease, ulcerative colitis, or lupus  have had or planning to have an allogeneic stem cell transplant (uses someone else's stem cells)  history of organ transplant  history of chest radiation  nervous system problems like myasthenia gravis or Guillain-Barre syndrome  an unusual or allergic reaction to pembrolizumab, other medicines, foods,  dyes, or preservatives  pregnant or trying to get pregnant  breast-feeding How should I use this medicine? This medicine is for infusion into a vein. It is given by a health care professional in a hospital or clinic setting. A special MedGuide will be given to you before each treatment. Be sure to read this information carefully each time. Talk to your pediatrician regarding the use of this medicine in children. While this drug may be prescribed for children as young as 6 months for selected conditions, precautions do apply. Overdosage: If you think you have taken too much of this medicine contact a poison control center or emergency room at once. NOTE: This medicine is only for you. Do not share this medicine with others. What if I miss a dose? It is important not to miss your dose. Call your doctor or health care professional if you are unable to keep an appointment. What may interact with this medicine? Interactions have not been studied. This list may not describe all possible interactions. Give your health care provider a list of all the medicines, herbs, non-prescription drugs, or dietary supplements you use. Also tell them if you smoke, drink alcohol, or use illegal drugs. Some items may interact with your medicine. What should I watch for while using this medicine? Your condition will be monitored carefully while you are receiving this medicine. You may need blood work done while you are taking this medicine. Do  not become pregnant while taking this medicine or for 4 months after stopping it. Women should inform their doctor if they wish to become pregnant or think they might be pregnant. There is a potential for serious side effects to an unborn child. Talk to your health care professional or pharmacist for more information. Do not breast-feed an infant while taking this medicine or for 4 months after the last dose. What side effects may I notice from receiving this medicine? Side  effects that you should report to your doctor or health care professional as soon as possible:  allergic reactions like skin rash, itching or hives, swelling of the face, lips, or tongue  bloody or black, tarry  breathing problems  changes in vision  chest pain  chills  confusion  constipation  cough  diarrhea  dizziness or feeling faint or lightheaded  fast or irregular heartbeat  fever  flushing  joint pain  low blood counts - this medicine may decrease the number of white blood cells, red blood cells and platelets. You may be at increased risk for infections and bleeding.  muscle pain  muscle weakness  pain, tingling, numbness in the hands or feet  persistent headache  redness, blistering, peeling or loosening of the skin, including inside the mouth  signs and symptoms of high blood sugar such as dizziness; dry mouth; dry skin; fruity breath; nausea; stomach pain; increased hunger or thirst; increased urination  signs and symptoms of kidney injury like trouble passing urine or change in the amount of urine  signs and symptoms of liver injury like dark urine, light-colored stools, loss of appetite, nausea, right upper belly pain, yellowing of the eyes or skin  sweating  swollen lymph nodes  weight loss Side effects that usually do not require medical attention (report to your doctor or health care professional if they continue or are bothersome):  decreased appetite  hair loss  tiredness This list may not describe all possible side effects. Call your doctor for medical advice about side effects. You may report side effects to FDA at 1-800-FDA-1088. Where should I keep my medicine? This drug is given in a hospital or clinic and will not be stored at home. NOTE: This sheet is a summary. It may not cover all possible information. If you have questions about this medicine, talk to your doctor, pharmacist, or health care provider.  2021 [employer]vier/Gold  Standard (2018-12-11 21:44:53)

## 2020-02-27 DIAGNOSIS — J4540 Moderate persistent asthma, uncomplicated: Secondary | ICD-10-CM | POA: Diagnosis not present

## 2020-03-07 DIAGNOSIS — J4520 Mild intermittent asthma, uncomplicated: Secondary | ICD-10-CM | POA: Diagnosis not present

## 2020-03-15 ENCOUNTER — Encounter: Payer: Self-pay | Admitting: Oncology

## 2020-03-15 DIAGNOSIS — I771 Stricture of artery: Secondary | ICD-10-CM | POA: Diagnosis not present

## 2020-03-15 DIAGNOSIS — I708 Atherosclerosis of other arteries: Secondary | ICD-10-CM | POA: Diagnosis not present

## 2020-03-15 DIAGNOSIS — C3481 Malignant neoplasm of overlapping sites of right bronchus and lung: Secondary | ICD-10-CM | POA: Diagnosis not present

## 2020-03-15 DIAGNOSIS — G458 Other transient cerebral ischemic attacks and related syndromes: Secondary | ICD-10-CM | POA: Diagnosis not present

## 2020-03-15 DIAGNOSIS — J439 Emphysema, unspecified: Secondary | ICD-10-CM | POA: Diagnosis not present

## 2020-03-15 NOTE — Progress Notes (Unsigned)
[facility]  [address] [place],  [place]  [address] [phone]  Clinic Day:  03/16/2020  Referring physician: Janine Limbo, PA-C   HISTORY OF PRESENT ILLNESS:  The patient is a 55 y.o. female with stage IVA lung adenocarcinoma.  This staging is based upon initial scans showing bilateral lung involvement with her cancer.  She comes in today to go over her CT scans to ascertain her new disease baseline after receiving 12 cycles of maintenance pembrolizumab.  Her maintenance immunotherapy was preceded by 4 cycles of carboplatin/Alimta/pembrolizumab.  The patient claims to have tolerated her 12th cycle of maintenance immunotherapy fairly well.  However, she complains of progressive fatigue.  She denies having any new respiratory symptoms which concern her for overt signs of disease progression.  She continues to work on a daily basis.  PHYSICAL EXAM:  Blood pressure (!) 144/64, pulse 85, temperature 98 F (36.7 C), resp. rate 16, height 5\' 5"  (1.651 m), weight 108 lb 1.6 oz (49 kg), SpO2 98 %. Wt Readings from Last 3 Encounters:  03/16/20 108 lb 1.6 oz (49 kg)  02/25/20 113 lb 12 oz (51.6 kg)  02/23/20 110 lb 8 oz (50.1 kg)   Body mass index is 17.99 kg/m. Performance status (ECOG): 1 - Symptomatic but completely ambulatory Physical Exam Constitutional:      Appearance: Normal appearance. She is not ill-appearing.  HENT:     Mouth/Throat:     Mouth: Mucous membranes are moist.     Pharynx: Oropharynx is clear. No oropharyngeal exudate or posterior oropharyngeal erythema.  Cardiovascular:     Rate and Rhythm: Normal rate and regular rhythm.     Heart sounds: No murmur heard. No friction rub. No gallop.   Pulmonary:     Effort: Pulmonary effort is normal. No respiratory distress.     Breath sounds: Normal breath sounds. No wheezing, rhonchi or rales.  Chest:  Breasts:     Right: No axillary adenopathy or supraclavicular adenopathy.     Left:  No axillary adenopathy or supraclavicular adenopathy.    Abdominal:     General: Bowel sounds are normal. There is no distension.     Palpations: Abdomen is soft. There is no mass.     Tenderness: There is no abdominal tenderness.  Musculoskeletal:        General: No swelling.     Right lower leg: No edema.     Left lower leg: No edema.  Lymphadenopathy:     Cervical: No cervical adenopathy.     Upper Body:     Right upper body: No supraclavicular or axillary adenopathy.     Left upper body: No supraclavicular or axillary adenopathy.     Lower Body: No right inguinal adenopathy. No left inguinal adenopathy.  Skin:    General: Skin is warm.     Coloration: Skin is not jaundiced.     Findings: No lesion or rash.  Neurological:     General: No focal deficit present.     Mental Status: She is alert and oriented to person, place, and time. Mental status is at baseline.     Cranial Nerves: Cranial nerves are intact.  Psychiatric:        Mood and Affect: Mood normal.        Behavior: Behavior normal.        Thought Content: Thought content normal.   SCANS: CT scans of her chest revealed the following: FINDINGS: Cardiovascular: Right subclavian Port-A-Cath extends  to the level of the upper SVC. There is diffuse atherosclerosis of the aorta, great vessels and coronary arteries. There is ostial stenosis of the left subclavian artery which causes some luminal narrowing and could predispose to subclavian steal syndrome. No acute vascular findings. The heart size is normal. Stable small pericardial effusion.  Mediastinum/Nodes: Scattered small mediastinal lymph nodes are similar to the prior study, largest in the AP window measuring 10 mm on image 44/2. There are no enlarged axillary or hilar lymph nodes. The thyroid gland, trachea and esophagus demonstrate no significant findings.  Lungs/Pleura: There is no pleural effusion. Moderate centrilobular and paraseptal emphysema again  noted with new patchy ground-glass and airspace opacities at both lung bases, likely infectious/inflammatory. Scattered, lower lobe predominant small pulmonary nodules are largely unchanged, including an index right lower lobe nodule measuring 5 mm on image 93/4. A left lower lobe nodule measuring 5 mm on image 79/4 appears minimally larger.  Upper abdomen: The visualized upper abdomen appears stable without significant findings.  Musculoskeletal/Chest wall: There is no chest wall mass or suspicious osseous finding.  IMPRESSION: 1. New patchy ground-glass and airspace opacities at both lung bases, likely infectious/inflammatory. 2. Scattered, lower lobe predominant small pulmonary nodules are largely unchanged, although a left lower lobe nodule appears minimally larger. 3. Stable small mediastinal lymph nodes, consistent with treated nodal disease. No definite signs of progressive metastases. 4. Aortic Atherosclerosis (ICD10-I70.0) and Emphysema (ICD10-J43.9). Left subclavian arterial ostial stenosis, potentially predisposing to subclavian steal syndrome.   LABS:       ASSESSMENT & PLAN:  Assessment/Plan:  A 55 y.o. female with stage IVA lung adenocarcinoma.  In clinic today, I went over her CT images with her, for which she could see she has no evidence of disease progression.  With respect to her fatigue, this could be related to her immunotherapy.  However, her thyroid function remains fine.  Her peripheral counts remain fine.  The patient wants time to determine whether she will proceed with her 13th cycle of maintenance pembrolizumab this week or take a break.  I will tentatively see her back in 3 weeks before she heads into a potential 14th cycle of maintenance pembrolizumab immunotherapy.   The patient understands all the plans discussed today and is in agreement with them.    Dequincy Macarthur Critchley, MD

## 2020-03-16 ENCOUNTER — Other Ambulatory Visit: Payer: Self-pay | Admitting: Oncology

## 2020-03-16 ENCOUNTER — Other Ambulatory Visit: Payer: Self-pay

## 2020-03-16 ENCOUNTER — Telehealth: Payer: Self-pay | Admitting: Oncology

## 2020-03-16 ENCOUNTER — Inpatient Hospital Stay: Payer: Federal, State, Local not specified - PPO | Admitting: Oncology

## 2020-03-16 ENCOUNTER — Telehealth: Payer: Self-pay

## 2020-03-16 VITALS — BP 144/64 | HR 85 | Temp 98.0°F | Resp 16 | Ht 65.0 in | Wt 108.1 lb

## 2020-03-16 DIAGNOSIS — C3481 Malignant neoplasm of overlapping sites of right bronchus and lung: Secondary | ICD-10-CM | POA: Diagnosis not present

## 2020-03-16 NOTE — Telephone Encounter (Signed)
Dr Bobby Rumpf states her thyroid levels are normal. If pt wants a break from the immunotherapy due to the fatigue, just to let him know.  I notified pt of above. She wants to skip tomorrows treatment & f/u in March, If she is feeling better, she may take the March dosage.  I notified Dr Bobby Rumpf of pt's decision & I also sent message to Estill Dooms, pharmacist as well.

## 2020-03-16 NOTE — Telephone Encounter (Signed)
Per 2/22 los next appt sched and given to patient

## 2020-03-17 ENCOUNTER — Inpatient Hospital Stay: Payer: Federal, State, Local not specified - PPO

## 2020-03-18 ENCOUNTER — Other Ambulatory Visit: Payer: Self-pay | Admitting: Oncology

## 2020-03-22 ENCOUNTER — Encounter: Payer: Self-pay | Admitting: Oncology

## 2020-03-26 DIAGNOSIS — J4540 Moderate persistent asthma, uncomplicated: Secondary | ICD-10-CM | POA: Diagnosis not present

## 2020-04-02 ENCOUNTER — Other Ambulatory Visit: Payer: Self-pay

## 2020-04-02 DIAGNOSIS — C3481 Malignant neoplasm of overlapping sites of right bronchus and lung: Secondary | ICD-10-CM

## 2020-04-02 DIAGNOSIS — R0789 Other chest pain: Secondary | ICD-10-CM

## 2020-04-02 MED ORDER — HYDROCODONE-ACETAMINOPHEN 7.5-325 MG PO TABS: 1.0000 | ORAL_TABLET | Freq: Four times a day (QID) | ORAL | 0 refills | Status: DC | PRN

## 2020-04-04 DIAGNOSIS — J4520 Mild intermittent asthma, uncomplicated: Secondary | ICD-10-CM | POA: Diagnosis not present

## 2020-04-05 ENCOUNTER — Other Ambulatory Visit: Payer: Self-pay | Admitting: Oncology

## 2020-04-05 NOTE — Progress Notes (Signed)
[facility]  [address] [place],  [place]  [address] [phone]  Clinic Day:  04/06/2020  Referring physician: Janine Limbo, PA-C   HISTORY OF PRESENT ILLNESS:  The patient is a 55 y.o. female with stage IVA lung adenocarcinoma.  This staging is based upon initial scans showing bilateral lung involvement with her cancer.  Recent CT scans showed that her disease remains under ideal control.  However, due to her degree of fatigue, she held her 13th cycle of maintenance pembrolizumab  3 weeks ago.  She comes in to reassess what she wishes to do.  She does believe the extra few weeks off from immunotherapy helped bring down her level of fatigue.  She claims to have had an episode of scant hemoptysis a few weeks ago, but has not had any since.  She denies having other respiratory symptoms which concern her for overt signs of disease progression.  Of note, she continues to work on a daily basis.  PHYSICAL EXAM:  Blood pressure 100/61, pulse 75, temperature 98.2 F (36.8 C), resp. rate 16, height 5\' 5"  (1.651 m), weight 108 lb 1.6 oz (49 kg), SpO2 100 %. Wt Readings from Last 3 Encounters:  04/06/20 108 lb 1.6 oz (49 kg)  03/16/20 108 lb 1.6 oz (49 kg)  02/25/20 113 lb 12 oz (51.6 kg)   Body mass index is 17.99 kg/m. Performance status (ECOG): 1 - Symptomatic but completely ambulatory Physical Exam Constitutional:      Appearance: Normal appearance. She is not ill-appearing.  HENT:     Mouth/Throat:     Mouth: Mucous membranes are moist.     Pharynx: Oropharynx is clear. No oropharyngeal exudate or posterior oropharyngeal erythema.  Cardiovascular:     Rate and Rhythm: Normal rate and regular rhythm.     Heart sounds: No murmur heard. No friction rub. No gallop.   Pulmonary:     Effort: Pulmonary effort is normal. No respiratory distress.     Breath sounds: Normal breath sounds. No wheezing, rhonchi or rales.  Chest:  Breasts:     Right: No  axillary adenopathy or supraclavicular adenopathy.     Left: No axillary adenopathy or supraclavicular adenopathy.    Abdominal:     General: Bowel sounds are normal. There is no distension.     Palpations: Abdomen is soft. There is no mass.     Tenderness: There is no abdominal tenderness.  Musculoskeletal:        General: No swelling.     Right lower leg: No edema.     Left lower leg: No edema.  Lymphadenopathy:     Cervical: No cervical adenopathy.     Upper Body:     Right upper body: No supraclavicular or axillary adenopathy.     Left upper body: No supraclavicular or axillary adenopathy.     Lower Body: No right inguinal adenopathy. No left inguinal adenopathy.  Skin:    General: Skin is warm.     Coloration: Skin is not jaundiced.     Findings: No lesion or rash.  Neurological:     General: No focal deficit present.     Mental Status: She is alert and oriented to person, place, and time. Mental status is at baseline.     Cranial Nerves: Cranial nerves are intact.  Psychiatric:        Mood and Affect: Mood normal.        Behavior: Behavior normal.  Thought Content: Thought content normal.    LABS:    Ref. Range 04/06/2020 00:00  Sodium Latest Ref Range: 137 - 147  137  Potassium Latest Ref Range: 3.4 - 5.3  3.8  Chloride Latest Ref Range: 99 - 108  105  CO2 Latest Ref Range: 13 - 22  29 (A)  Glucose Unknown 61  BUN Latest Ref Range: 4 - 21  9  Creatinine Latest Ref Range: 0.5 - 1.1  0.5  Calcium Latest Ref Range: 8.7 - 10.7  9.2  Alkaline Phosphatase Latest Ref Range: 25 - 125  67  Albumin Latest Ref Range: 3.5 - 5.0  4.5  AST Latest Ref Range: 13 - 35  22  ALT Latest Ref Range: 7 - 35  16  Bilirubin, Total Unknown 0.4  WBC Unknown 7.2  RBC Latest Ref Range: 3.87 - 5.11  4.67  Hemoglobin Latest Ref Range: 12.0 - 16.0  13.9  HCT Latest Ref Range: 36 - 46  43  MCV Latest Ref Range: 81 - 99  91  Platelets Latest Ref Range: 150 - 399  291  NEUT# Unknown  4.82      ASSESSMENT & PLAN:  Assessment/Plan:  A 55 y.o. female with stage IVA lung adenocarcinoma.  She is willing to proceed with her 13th cycle of pembrolizumab this week.  However, if her fatigue gets worse again, she is contemplating either spacing her treatments out or stopping therapy altogether.  Clinically, she is doing well.  I will see her back in 3 weeks before she heads into a potential 14th cycle of maintenance pembrolizumab immunotherapy.   The patient understands all the plans discussed today and is in agreement with them.    Isel Skufca Macarthur Critchley, MD

## 2020-04-06 ENCOUNTER — Other Ambulatory Visit: Payer: Self-pay

## 2020-04-06 ENCOUNTER — Inpatient Hospital Stay: Payer: Federal, State, Local not specified - PPO

## 2020-04-06 ENCOUNTER — Inpatient Hospital Stay: Payer: Federal, State, Local not specified - PPO | Attending: Oncology | Admitting: Oncology

## 2020-04-06 ENCOUNTER — Other Ambulatory Visit: Payer: Self-pay | Admitting: Oncology

## 2020-04-06 ENCOUNTER — Other Ambulatory Visit: Payer: Self-pay | Admitting: Hematology and Oncology

## 2020-04-06 ENCOUNTER — Telehealth: Payer: Self-pay | Admitting: Oncology

## 2020-04-06 VITALS — BP 100/61 | HR 75 | Temp 98.2°F | Resp 16 | Ht 65.0 in | Wt 108.1 lb

## 2020-04-06 DIAGNOSIS — C3432 Malignant neoplasm of lower lobe, left bronchus or lung: Secondary | ICD-10-CM | POA: Insufficient documentation

## 2020-04-06 DIAGNOSIS — C3481 Malignant neoplasm of overlapping sites of right bronchus and lung: Secondary | ICD-10-CM

## 2020-04-06 DIAGNOSIS — Z79899 Other long term (current) drug therapy: Secondary | ICD-10-CM | POA: Diagnosis not present

## 2020-04-06 DIAGNOSIS — Z5112 Encounter for antineoplastic immunotherapy: Secondary | ICD-10-CM | POA: Diagnosis not present

## 2020-04-06 DIAGNOSIS — R530 Neoplastic (malignant) related fatigue: Secondary | ICD-10-CM | POA: Insufficient documentation

## 2020-04-06 LAB — HEPATIC FUNCTION PANEL
ALT: 16 (ref 7–35)
AST: 22 (ref 13–35)
Alkaline Phosphatase: 67 (ref 25–125)
Bilirubin, Total: 0.4

## 2020-04-06 LAB — CBC
MCV: 91 (ref 81–99)
RBC: 4.67 (ref 3.87–5.11)

## 2020-04-06 LAB — CBC AND DIFFERENTIAL
HCT: 43 (ref 36–46)
Hemoglobin: 13.9 (ref 12.0–16.0)
Neutrophils Absolute: 4.82
Platelets: 291 (ref 150–399)
WBC: 7.2

## 2020-04-06 LAB — COMPREHENSIVE METABOLIC PANEL
Albumin: 4.5 (ref 3.5–5.0)
Calcium: 9.2 (ref 8.7–10.7)

## 2020-04-06 LAB — BASIC METABOLIC PANEL
BUN: 9 (ref 4–21)
CO2: 29 — AB (ref 13–22)
Chloride: 105 (ref 99–108)
Creatinine: 0.5 (ref 0.5–1.1)
Glucose: 61
Potassium: 3.8 (ref 3.4–5.3)
Sodium: 137 (ref 137–147)

## 2020-04-06 LAB — TSH: TSH: 2.309 u[IU]/mL (ref 0.350–4.500)

## 2020-04-06 NOTE — Progress Notes (Unsigned)
t

## 2020-04-06 NOTE — Telephone Encounter (Signed)
Per 3/15 LOS, patient scheduled for April Appt's (including Infusion).  Gave patient Appt Summary

## 2020-04-07 ENCOUNTER — Inpatient Hospital Stay: Payer: Federal, State, Local not specified - PPO

## 2020-04-07 VITALS — BP 109/61 | HR 69 | Temp 98.0°F | Resp 18 | Ht 65.0 in | Wt 109.1 lb

## 2020-04-07 DIAGNOSIS — C3481 Malignant neoplasm of overlapping sites of right bronchus and lung: Secondary | ICD-10-CM

## 2020-04-07 DIAGNOSIS — Z5112 Encounter for antineoplastic immunotherapy: Secondary | ICD-10-CM | POA: Diagnosis not present

## 2020-04-07 DIAGNOSIS — R530 Neoplastic (malignant) related fatigue: Secondary | ICD-10-CM | POA: Diagnosis not present

## 2020-04-07 DIAGNOSIS — Z79899 Other long term (current) drug therapy: Secondary | ICD-10-CM | POA: Diagnosis not present

## 2020-04-07 DIAGNOSIS — C3432 Malignant neoplasm of lower lobe, left bronchus or lung: Secondary | ICD-10-CM | POA: Diagnosis not present

## 2020-04-07 MED ORDER — SODIUM CHLORIDE 0.9 % IV SOLN
200.0000 mg | Freq: Once | INTRAVENOUS | Status: AC
  Administered 2020-04-07: 200 mg via INTRAVENOUS
  Filled 2020-04-07: qty 8

## 2020-04-07 MED ORDER — HEPARIN SOD (PORK) LOCK FLUSH 100 UNIT/ML IV SOLN
500.0000 [IU] | Freq: Once | INTRAVENOUS | Status: AC | PRN
  Administered 2020-04-07: 500 [IU]
  Filled 2020-04-07: qty 5

## 2020-04-07 MED ORDER — SODIUM CHLORIDE 0.9 % IV SOLN
Freq: Once | INTRAVENOUS | Status: AC
  Filled 2020-04-07: qty 250

## 2020-04-07 NOTE — Patient Instructions (Signed)
[facility] Discharge Instructions for Patients Receiving Chemotherapy  Today you received the following chemotherapy agents pembrolizumab  To help prevent nausea and vomiting after your treatment, we encourage you to take your nausea medication as directed   If you develop nausea and vomiting that is not controlled by your nausea medication, call the clinic.   BELOW ARE SYMPTOMS THAT SHOULD BE REPORTED IMMEDIATELY:  *FEVER GREATER THAN 100.5 F  *CHILLS WITH OR WITHOUT FEVER  NAUSEA AND VOMITING THAT IS NOT CONTROLLED WITH YOUR NAUSEA MEDICATION  *UNUSUAL SHORTNESS OF BREATH  *UNUSUAL BRUISING OR BLEEDING  TENDERNESS IN MOUTH AND THROAT WITH OR WITHOUT PRESENCE OF ULCERS  *URINARY PROBLEMS  *BOWEL PROBLEMS  UNUSUAL RASH Items with * indicate a potential emergency and should be followed up as soon as possible.  Feel free to call the clinic should you have any questions or concerns at The clinic phone number is [phone].  Please show the [facility] at check-in to the Emergency Department and triage nurse.  Pembrolizumab injection What is this medicine? PEMBROLIZUMAB (pem broe liz ue mab) is a monoclonal antibody. It is used to treat certain types of cancer. This medicine may be used for other purposes; ask your health care provider or pharmacist if you have questions. COMMON BRAND NAME(S): Keytruda What should I tell my health care provider before I take this medicine? They need to know if you have any of these conditions:  autoimmune diseases like Crohn's disease, ulcerative colitis, or lupus  have had or planning to have an allogeneic stem cell transplant (uses someone else's stem cells)  history of organ transplant  history of chest radiation  nervous system problems like myasthenia gravis or Guillain-Barre syndrome  an unusual or allergic reaction to pembrolizumab, other medicines, foods, dyes, or preservatives  pregnant or  trying to get pregnant  breast-feeding How should I use this medicine? This medicine is for infusion into a vein. It is given by a health care professional in a hospital or clinic setting. A special MedGuide will be given to you before each treatment. Be sure to read this information carefully each time. Talk to your pediatrician regarding the use of this medicine in children. While this drug may be prescribed for children as young as 6 months for selected conditions, precautions do apply. Overdosage: If you think you have taken too much of this medicine contact a poison control center or emergency room at once. NOTE: This medicine is only for you. Do not share this medicine with others. What if I miss a dose? It is important not to miss your dose. Call your doctor or health care professional if you are unable to keep an appointment. What may interact with this medicine? Interactions have not been studied. This list may not describe all possible interactions. Give your health care provider a list of all the medicines, herbs, non-prescription drugs, or dietary supplements you use. Also tell them if you smoke, drink alcohol, or use illegal drugs. Some items may interact with your medicine. What should I watch for while using this medicine? Your condition will be monitored carefully while you are receiving this medicine. You may need blood work done while you are taking this medicine. Do not become pregnant while taking this medicine or for 4 months after stopping it. Women should inform their doctor if they wish to become pregnant or think they might be pregnant. There is a potential for serious side effects to an unborn  child. Talk to your health care professional or pharmacist for more information. Do not breast-feed an infant while taking this medicine or for 4 months after the last dose. What side effects may I notice from receiving this medicine? Side effects that you should report to your  doctor or health care professional as soon as possible:  allergic reactions like skin rash, itching or hives, swelling of the face, lips, or tongue  bloody or black, tarry  breathing problems  changes in vision  chest pain  chills  confusion  constipation  cough  diarrhea  dizziness or feeling faint or lightheaded  fast or irregular heartbeat  fever  flushing  joint pain  low blood counts - this medicine may decrease the number of white blood cells, red blood cells and platelets. You may be at increased risk for infections and bleeding.  muscle pain  muscle weakness  pain, tingling, numbness in the hands or feet  persistent headache  redness, blistering, peeling or loosening of the skin, including inside the mouth  signs and symptoms of high blood sugar such as dizziness; dry mouth; dry skin; fruity breath; nausea; stomach pain; increased hunger or thirst; increased urination  signs and symptoms of kidney injury like trouble passing urine or change in the amount of urine  signs and symptoms of liver injury like dark urine, light-colored stools, loss of appetite, nausea, right upper belly pain, yellowing of the eyes or skin  sweating  swollen lymph nodes  weight loss Side effects that usually do not require medical attention (report to your doctor or health care professional if they continue or are bothersome):  decreased appetite  hair loss  tiredness This list may not describe all possible side effects. Call your doctor for medical advice about side effects. You may report side effects to FDA at 1-800-FDA-1088. Where should I keep my medicine? This drug is given in a hospital or clinic and will not be stored at home. NOTE: This sheet is a summary. It may not cover all possible information. If you have questions about this medicine, talk to your doctor, pharmacist, or health care provider.  2021 Elsevier/Gold Standard (2018-12-11 21:44:53)

## 2020-04-07 NOTE — Progress Notes (Signed)
Pt d/c at 1515 stable

## 2020-04-23 ENCOUNTER — Telehealth: Payer: Self-pay | Admitting: Oncology

## 2020-04-23 NOTE — Telephone Encounter (Signed)
04/23/20 appts rescheduled-Per Dr Jolyn Nap from 04/07/20

## 2020-04-26 DIAGNOSIS — J4540 Moderate persistent asthma, uncomplicated: Secondary | ICD-10-CM | POA: Diagnosis not present

## 2020-04-27 ENCOUNTER — Inpatient Hospital Stay: Payer: Federal, State, Local not specified - PPO

## 2020-04-27 ENCOUNTER — Inpatient Hospital Stay: Payer: Federal, State, Local not specified - PPO | Admitting: Oncology

## 2020-04-28 ENCOUNTER — Inpatient Hospital Stay: Payer: Federal, State, Local not specified - PPO

## 2020-05-05 DIAGNOSIS — J4520 Mild intermittent asthma, uncomplicated: Secondary | ICD-10-CM | POA: Diagnosis not present

## 2020-05-16 NOTE — Progress Notes (Signed)
[facility]  [address] [place],  [place]  [address] [phone]  Clinic Day:  05/17/2020  Referring physician: Janine Limbo, PA-C   HISTORY OF PRESENT ILLNESS:  The patient is a 55 y.o. female with stage IVA lung adenocarcinoma.  This staging is based upon initial scans showing bilateral lung involvement with her cancer.  Recent CT scans showed that her disease remains under ideal control.  She comes in today to be evaluated before heading into her 14th cycle of pembrolizumab.  The patient received her 13th cycle of maintenance pembrolizumab 6 weeks ago.  She claims her fatigue is much better since her immunotherapy has been spaced out to every 6 weeks.  She is concerned about weight loss, but does not want anything to stimulate her appetite.  From a lung cancer standpoint, she denies having other respiratory symptoms which concern her for overt signs of disease progression.  Of note, she continues to work on a daily basis.  PHYSICAL EXAM:  Blood pressure 113/60, pulse 72, temperature 98 F (36.7 C), resp. rate 14, height 5\' 5"  (1.651 m), weight 107 lb 14.4 oz (48.9 kg), SpO2 99 %. Wt Readings from Last 3 Encounters:  05/17/20 107 lb 14.4 oz (48.9 kg)  04/07/20 109 lb 1.3 oz (49.5 kg)  04/06/20 108 lb 1.6 oz (49 kg)   Body mass index is 17.96 kg/m. Performance status (ECOG): 1 - Symptomatic but completely ambulatory Physical Exam Constitutional:      Appearance: Normal appearance. She is not ill-appearing.  HENT:     Mouth/Throat:     Mouth: Mucous membranes are moist.     Pharynx: Oropharynx is clear. No oropharyngeal exudate or posterior oropharyngeal erythema.  Cardiovascular:     Rate and Rhythm: Normal rate and regular rhythm.     Heart sounds: No murmur heard. No friction rub. No gallop.   Pulmonary:     Effort: Pulmonary effort is normal. No respiratory distress.     Breath sounds: Normal breath sounds. No wheezing, rhonchi or rales.   Chest:  Breasts:     Right: No axillary adenopathy or supraclavicular adenopathy.     Left: No axillary adenopathy or supraclavicular adenopathy.    Abdominal:     General: Bowel sounds are normal. There is no distension.     Palpations: Abdomen is soft. There is no mass.     Tenderness: There is no abdominal tenderness.  Musculoskeletal:        General: No swelling.     Right lower leg: No edema.     Left lower leg: No edema.  Lymphadenopathy:     Cervical: No cervical adenopathy.     Upper Body:     Right upper body: No supraclavicular or axillary adenopathy.     Left upper body: No supraclavicular or axillary adenopathy.     Lower Body: No right inguinal adenopathy. No left inguinal adenopathy.  Skin:    General: Skin is warm.     Coloration: Skin is not jaundiced.     Findings: No lesion or rash.  Neurological:     General: No focal deficit present.     Mental Status: She is alert and oriented to person, place, and time. Mental status is at baseline.     Cranial Nerves: Cranial nerves are intact.  Psychiatric:        Mood and Affect: Mood normal.        Behavior: Behavior normal.  Thought Content: Thought content normal.    LABS:  Results for YAJAIRA, DOFFING (MRN [id number]) as of 05/17/2020 12:55  Ref. Range 05/17/2020 00:00  Sodium Latest Ref Range: 137 - 147  135 (A)  Potassium Latest Ref Range: 3.4 - 5.3  4.0  Chloride Latest Ref Range: 99 - 108  104  CO2 Latest Ref Range: 13 - 22  24 (A)  Glucose Unknown 76  BUN Latest Ref Range: 4 - 21  12  Creatinine Latest Ref Range: 0.5 - 1.1  0.5  Calcium Latest Ref Range: 8.7 - 10.7  8.7  Alkaline Phosphatase Latest Ref Range: 25 - 125  58  Albumin Latest Ref Range: 3.5 - 5.0  4.3  AST Latest Ref Range: 13 - 35  24  ALT Latest Ref Range: 7 - 35  19  Bilirubin, Total Unknown 0.5  WBC Unknown 8.2  RBC Latest Ref Range: 3.87 - 5.11  4.57  Hemoglobin Latest Ref Range: 12.0 - 16.0  41.6 (A)  HCT Latest Ref  Range: 36 - 46  301 (A)  MCV Latest Ref Range: 81 - 99  91  NEUT# Unknown 5.74    ASSESSMENT & PLAN:  Assessment/Plan:  A 55 y.o. female with stage IVA lung adenocarcinoma.  She will proceed with her 14th cycle of pemobrolizumab this week.  Per her request, we will begin spacing all immunotherapy treatments out to every 6 weeks. Clinically, she appears to be doing very well.  I will see her back in 3 weeks before she heads into her 15th cycle of maintenance pembrolizumab immunotherapy.   The patient understands all the plans discussed today and is in agreement with them.    Angellynn Kimberlin Macarthur Critchley, MD

## 2020-05-17 ENCOUNTER — Other Ambulatory Visit: Payer: Self-pay

## 2020-05-17 ENCOUNTER — Telehealth: Payer: Self-pay | Admitting: Oncology

## 2020-05-17 ENCOUNTER — Other Ambulatory Visit: Payer: Self-pay | Admitting: Oncology

## 2020-05-17 ENCOUNTER — Encounter: Payer: Self-pay | Admitting: Oncology

## 2020-05-17 ENCOUNTER — Inpatient Hospital Stay: Payer: Federal, State, Local not specified - PPO | Attending: Oncology

## 2020-05-17 ENCOUNTER — Inpatient Hospital Stay (INDEPENDENT_AMBULATORY_CARE_PROVIDER_SITE_OTHER): Payer: Federal, State, Local not specified - PPO | Admitting: Oncology

## 2020-05-17 ENCOUNTER — Other Ambulatory Visit: Payer: Self-pay | Admitting: Hematology and Oncology

## 2020-05-17 VITALS — BP 113/60 | HR 72 | Temp 98.0°F | Resp 14 | Ht 65.0 in | Wt 107.9 lb

## 2020-05-17 DIAGNOSIS — C3432 Malignant neoplasm of lower lobe, left bronchus or lung: Secondary | ICD-10-CM | POA: Diagnosis not present

## 2020-05-17 DIAGNOSIS — Z5112 Encounter for antineoplastic immunotherapy: Secondary | ICD-10-CM | POA: Insufficient documentation

## 2020-05-17 DIAGNOSIS — C3481 Malignant neoplasm of overlapping sites of right bronchus and lung: Secondary | ICD-10-CM

## 2020-05-17 LAB — BASIC METABOLIC PANEL
BUN: 12 (ref 4–21)
CO2: 24 — AB (ref 13–22)
Chloride: 104 (ref 99–108)
Creatinine: 0.5 (ref 0.5–1.1)
Glucose: 76
Potassium: 4 (ref 3.4–5.3)
Sodium: 135 — AB (ref 137–147)

## 2020-05-17 LAB — HEPATIC FUNCTION PANEL
ALT: 19 (ref 7–35)
AST: 24 (ref 13–35)
Alkaline Phosphatase: 58 (ref 25–125)
Bilirubin, Total: 0.5

## 2020-05-17 LAB — CBC AND DIFFERENTIAL
HCT: 42 — AB (ref 36–46)
Hemoglobin: 14 — AB (ref 12.0–16.0)
Neutrophils Absolute: 5.74
WBC: 8.2

## 2020-05-17 LAB — COMPREHENSIVE METABOLIC PANEL
Albumin: 4.3 (ref 3.5–5.0)
Calcium: 8.7 (ref 8.7–10.7)

## 2020-05-17 LAB — CBC
MCV: 91 (ref 81–99)
RBC: 4.57 (ref 3.87–5.11)

## 2020-05-17 LAB — TSH: TSH: 2.705 u[IU]/mL (ref 0.350–4.500)

## 2020-05-17 NOTE — Telephone Encounter (Signed)
Per 4/25 LOS, patient scheduled for May/June Appt's.  Revised the Appt Summary (patient needs Labs, Follow Up before Infusion)  Requested patient be given updated Visit on 4/27

## 2020-05-18 ENCOUNTER — Other Ambulatory Visit: Payer: Self-pay | Admitting: Oncology

## 2020-05-18 LAB — T4: T4, Total: 8 ug/dL (ref 4.5–12.0)

## 2020-05-19 ENCOUNTER — Other Ambulatory Visit: Payer: Self-pay

## 2020-05-19 ENCOUNTER — Inpatient Hospital Stay: Payer: Federal, State, Local not specified - PPO

## 2020-05-19 ENCOUNTER — Other Ambulatory Visit: Payer: Self-pay | Admitting: Hematology and Oncology

## 2020-05-19 VITALS — BP 131/71 | HR 78 | Temp 97.1°F | Resp 16 | Ht 65.0 in | Wt 105.0 lb

## 2020-05-19 DIAGNOSIS — C3432 Malignant neoplasm of lower lobe, left bronchus or lung: Secondary | ICD-10-CM | POA: Diagnosis not present

## 2020-05-19 DIAGNOSIS — Z5112 Encounter for antineoplastic immunotherapy: Secondary | ICD-10-CM | POA: Diagnosis not present

## 2020-05-19 DIAGNOSIS — C3481 Malignant neoplasm of overlapping sites of right bronchus and lung: Secondary | ICD-10-CM

## 2020-05-19 MED ORDER — SODIUM CHLORIDE 0.9 % IV SOLN
Freq: Once | INTRAVENOUS | Status: AC
Start: 2020-05-19 — End: 2020-05-19
  Filled 2020-05-19: qty 250

## 2020-05-19 MED ORDER — SODIUM CHLORIDE 0.9 % IV SOLN
200.0000 mg | Freq: Once | INTRAVENOUS | Status: AC
  Administered 2020-05-19: 200 mg via INTRAVENOUS
  Filled 2020-05-19: qty 8

## 2020-05-19 MED ORDER — HEPARIN SOD (PORK) LOCK FLUSH 100 UNIT/ML IV SOLN
500.0000 [IU] | Freq: Once | INTRAVENOUS | Status: AC | PRN
  Administered 2020-05-19: 500 [IU]
  Filled 2020-05-19: qty 5

## 2020-05-19 NOTE — Patient Instructions (Signed)

## 2020-05-26 DIAGNOSIS — J4540 Moderate persistent asthma, uncomplicated: Secondary | ICD-10-CM | POA: Diagnosis not present

## 2020-05-31 ENCOUNTER — Telehealth: Payer: Self-pay | Admitting: Oncology

## 2020-05-31 NOTE — Telephone Encounter (Signed)
Per patient, her treatments are to be every 6 weeks now.Heather Gutierrez 5/16 Labs, Follow Up - 5/18 Infusion

## 2020-06-04 DIAGNOSIS — J4520 Mild intermittent asthma, uncomplicated: Secondary | ICD-10-CM | POA: Diagnosis not present

## 2020-06-07 ENCOUNTER — Other Ambulatory Visit: Payer: Federal, State, Local not specified - PPO

## 2020-06-07 ENCOUNTER — Inpatient Hospital Stay: Payer: Federal, State, Local not specified - PPO | Admitting: Oncology

## 2020-06-07 ENCOUNTER — Inpatient Hospital Stay: Payer: Federal, State, Local not specified - PPO

## 2020-06-07 ENCOUNTER — Ambulatory Visit: Payer: Federal, State, Local not specified - PPO | Admitting: Oncology

## 2020-06-09 ENCOUNTER — Inpatient Hospital Stay: Payer: Federal, State, Local not specified - PPO

## 2020-06-15 ENCOUNTER — Other Ambulatory Visit: Payer: Self-pay

## 2020-06-15 DIAGNOSIS — C3481 Malignant neoplasm of overlapping sites of right bronchus and lung: Secondary | ICD-10-CM

## 2020-06-15 DIAGNOSIS — R0789 Other chest pain: Secondary | ICD-10-CM

## 2020-06-15 MED ORDER — HYDROCODONE-ACETAMINOPHEN 7.5-325 MG PO TABS: 1.0000 | ORAL_TABLET | Freq: Four times a day (QID) | ORAL | 0 refills | Status: DC | PRN

## 2020-06-24 NOTE — Progress Notes (Signed)
[facility]  [address] [place],  [place]  [address] [phone]  Clinic Day:  06/28/2020  Referring physician: Janine Limbo, PA-C  This document serves as a record of services personally performed by Marice Potter, MD. It was created on their behalf by Curry,Lauren E, a trained medical scribe. The creation of this record is based on the scribe's personal observations and the provider's statements to them.  HISTORY OF PRESENT ILLNESS:  The patient is a 55 y.o. female with stage IVA lung adenocarcinoma.  This staging is based upon initial scans showing bilateral lung involvement with her cancer.  Recent CT scans showed that her disease remains under ideal control.  She comes in today to be evaluated before heading into her 15th cycle of pembrolizumab.  The patient received her 14th cycle of maintenance pembrolizumab 6 weeks ago.  She claims her fatigue is much better since her immunotherapy has been spaced out to every 6 weeks.  She is concerned about weight loss, but still does not want anything to stimulate her appetite.  In fact, she says her appetite is good, but she just cannot gain weight.  From a lung cancer standpoint, she denies having any new respiratory symptoms which concern her for overt signs of disease progression.  Of note, she continues to work on a daily basis.  PHYSICAL EXAM:  Blood pressure 111/89, pulse 72, temperature 98 F (36.7 C), resp. rate 16, weight 105 lb 8 oz (47.9 kg), SpO2 98 %. Wt Readings from Last 3 Encounters:  06/28/20 105 lb 8 oz (47.9 kg)  05/19/20 105 lb (47.6 kg)  05/17/20 107 lb 14.4 oz (48.9 kg)   Body mass index is 17.56 kg/m. Performance status (ECOG): 1 - Symptomatic but completely ambulatory Physical Exam Constitutional:      Appearance: Normal appearance. She is not ill-appearing.  HENT:     Mouth/Throat:     Mouth: Mucous membranes are moist.     Pharynx: Oropharynx is clear. No oropharyngeal  exudate or posterior oropharyngeal erythema.  Cardiovascular:     Rate and Rhythm: Normal rate and regular rhythm.     Heart sounds: No murmur heard. No friction rub. No gallop.   Pulmonary:     Effort: Pulmonary effort is normal. No respiratory distress.     Breath sounds: Normal breath sounds. No wheezing, rhonchi or rales.  Chest:  Breasts:     Right: No axillary adenopathy or supraclavicular adenopathy.     Left: No axillary adenopathy or supraclavicular adenopathy.    Abdominal:     General: Bowel sounds are normal. There is no distension.     Palpations: Abdomen is soft. There is no mass.     Tenderness: There is no abdominal tenderness.  Musculoskeletal:        General: No swelling.     Right lower leg: No edema.     Left lower leg: No edema.  Lymphadenopathy:     Cervical: No cervical adenopathy.     Upper Body:     Right upper body: No supraclavicular or axillary adenopathy.     Left upper body: No supraclavicular or axillary adenopathy.     Lower Body: No right inguinal adenopathy. No left inguinal adenopathy.  Skin:    General: Skin is warm.     Coloration: Skin is not jaundiced.     Findings: No lesion or rash.  Neurological:     General: No focal deficit present.  Mental Status: She is alert and oriented to person, place, and time. Mental status is at baseline.     Cranial Nerves: Cranial nerves are intact.  Psychiatric:        Mood and Affect: Mood normal.        Behavior: Behavior normal.        Thought Content: Thought content normal.    LABS:     ASSESSMENT & PLAN:  Assessment/Plan:  A 55 y.o. female with stage IVA lung adenocarcinoma.  She will proceed with her 15th cycle of pemobrolizumab this week.  Per her request, we will continue spacing all immunotherapy treatments out to every 6 weeks. Clinically, she appears to be doing very well.  I will see her back in 6 weeks before she heads into her 16th cycle of maintenance pembrolizumab immunotherapy.    The patient understands all the plans discussed today and is in agreement with them.     I, Rita Ohara, am acting as scribe for Marice Potter, MD    I have reviewed this report as typed by the medical scribe, and it is complete and accurate.  Dequincy Macarthur Critchley, MD

## 2020-06-26 DIAGNOSIS — J4540 Moderate persistent asthma, uncomplicated: Secondary | ICD-10-CM | POA: Diagnosis not present

## 2020-06-28 ENCOUNTER — Other Ambulatory Visit: Payer: Self-pay | Admitting: Oncology

## 2020-06-28 ENCOUNTER — Inpatient Hospital Stay: Payer: Federal, State, Local not specified - PPO | Attending: Oncology

## 2020-06-28 ENCOUNTER — Telehealth: Payer: Self-pay | Admitting: Oncology

## 2020-06-28 ENCOUNTER — Inpatient Hospital Stay (INDEPENDENT_AMBULATORY_CARE_PROVIDER_SITE_OTHER): Payer: Federal, State, Local not specified - PPO | Admitting: Oncology

## 2020-06-28 ENCOUNTER — Other Ambulatory Visit: Payer: Self-pay

## 2020-06-28 ENCOUNTER — Encounter: Payer: Self-pay | Admitting: Oncology

## 2020-06-28 VITALS — BP 111/89 | HR 72 | Temp 98.0°F | Resp 16 | Wt 105.5 lb

## 2020-06-28 DIAGNOSIS — C3432 Malignant neoplasm of lower lobe, left bronchus or lung: Secondary | ICD-10-CM | POA: Insufficient documentation

## 2020-06-28 DIAGNOSIS — C3481 Malignant neoplasm of overlapping sites of right bronchus and lung: Secondary | ICD-10-CM

## 2020-06-28 DIAGNOSIS — Z5112 Encounter for antineoplastic immunotherapy: Secondary | ICD-10-CM | POA: Insufficient documentation

## 2020-06-28 DIAGNOSIS — Z79899 Other long term (current) drug therapy: Secondary | ICD-10-CM | POA: Insufficient documentation

## 2020-06-28 LAB — BASIC METABOLIC PANEL
BUN: 8 (ref 4–21)
CO2: 27 — AB (ref 13–22)
Chloride: 100 (ref 99–108)
Creatinine: 0.5 (ref 0.5–1.1)
Glucose: 76
Potassium: 3.9 (ref 3.4–5.3)
Sodium: 137 (ref 137–147)

## 2020-06-28 LAB — COMPREHENSIVE METABOLIC PANEL
Albumin: 4.7 (ref 3.5–5.0)
Calcium: 9 (ref 8.7–10.7)

## 2020-06-28 LAB — CBC AND DIFFERENTIAL
HCT: 45 (ref 36–46)
Hemoglobin: 15.3 (ref 12.0–16.0)
Neutrophils Absolute: 5.54
Platelets: 302 (ref 150–399)
WBC: 7.8

## 2020-06-28 LAB — HEPATIC FUNCTION PANEL
ALT: 20 (ref 7–35)
AST: 28 (ref 13–35)
Alkaline Phosphatase: 57 (ref 25–125)
Bilirubin, Total: 0.6

## 2020-06-28 LAB — TSH: TSH: 3.017 u[IU]/mL (ref 0.350–4.500)

## 2020-06-28 LAB — CBC: RBC: 4.9 (ref 3.87–5.11)

## 2020-06-28 NOTE — Telephone Encounter (Signed)
06/28/20 next appt scheduled and given to patient

## 2020-06-28 NOTE — Progress Notes (Signed)
Patient is concerned about weight loss.

## 2020-06-29 LAB — T4: T4, Total: 7.3 ug/dL (ref 4.5–12.0)

## 2020-06-30 ENCOUNTER — Other Ambulatory Visit: Payer: Self-pay

## 2020-06-30 ENCOUNTER — Inpatient Hospital Stay: Payer: Federal, State, Local not specified - PPO

## 2020-06-30 VITALS — BP 126/63 | HR 82 | Temp 97.9°F | Resp 18 | Ht 65.0 in | Wt 107.0 lb

## 2020-06-30 DIAGNOSIS — C3481 Malignant neoplasm of overlapping sites of right bronchus and lung: Secondary | ICD-10-CM | POA: Diagnosis not present

## 2020-06-30 DIAGNOSIS — C3432 Malignant neoplasm of lower lobe, left bronchus or lung: Secondary | ICD-10-CM | POA: Diagnosis not present

## 2020-06-30 DIAGNOSIS — Z79899 Other long term (current) drug therapy: Secondary | ICD-10-CM | POA: Diagnosis not present

## 2020-06-30 DIAGNOSIS — Z5112 Encounter for antineoplastic immunotherapy: Secondary | ICD-10-CM | POA: Diagnosis not present

## 2020-06-30 MED ORDER — SODIUM CHLORIDE 0.9 % IV SOLN
200.0000 mg | Freq: Once | INTRAVENOUS | Status: AC
  Administered 2020-06-30: 200 mg via INTRAVENOUS
  Filled 2020-06-30: qty 8

## 2020-06-30 MED ORDER — SODIUM CHLORIDE 0.9 % IV SOLN
Freq: Once | INTRAVENOUS | Status: AC
  Filled 2020-06-30: qty 250

## 2020-06-30 MED ORDER — HEPARIN SOD (PORK) LOCK FLUSH 100 UNIT/ML IV SOLN
500.0000 [IU] | Freq: Once | INTRAVENOUS | Status: DC | PRN
  Filled 2020-06-30: qty 5

## 2020-06-30 NOTE — Patient Instructions (Signed)
[facility]  Discharge Instructions: Thank you for choosing [facility] to provide your oncology and hematology care.  If you have a lab appointment with the [facility], please go directly to the [facility] and check in at the registration area.   Wear comfortable clothing and clothing appropriate for easy access to any Portacath or PICC line.   We strive to give you quality time with your provider. You may need to reschedule your appointment if you arrive late (15 or more minutes).  Arriving late affects you and other patients whose appointments are after yours.  Also, if you miss three or more appointments without notifying the office, you may be dismissed from the clinic at the provider's discretion.      For prescription refill requests, have your pharmacy contact our office and allow 72 hours for refills to be completed.    Today you received the following chemotherapy and/or immunotherapy agents Pembrolizumab      To help prevent nausea and vomiting after your treatment, we encourage you to take your nausea medication as directed.  BELOW ARE SYMPTOMS THAT SHOULD BE REPORTED IMMEDIATELY: . *FEVER GREATER THAN 100.4 F (38 C) OR HIGHER . *CHILLS OR SWEATING . *NAUSEA AND VOMITING THAT IS NOT CONTROLLED WITH YOUR NAUSEA MEDICATION . *UNUSUAL SHORTNESS OF BREATH . *UNUSUAL BRUISING OR BLEEDING . *URINARY PROBLEMS (pain or burning when urinating, or frequent urination) . *BOWEL PROBLEMS (unusual diarrhea, constipation, pain near the anus) . TENDERNESS IN MOUTH AND THROAT WITH OR WITHOUT PRESENCE OF ULCERS (sore throat, sores in mouth, or a toothache) . UNUSUAL RASH, SWELLING OR PAIN  . UNUSUAL VAGINAL DISCHARGE OR ITCHING   Items with * indicate a potential emergency and should be followed up as soon as possible or go to the Emergency Department if any problems should occur.  Please show the CHEMOTHERAPY ALERT CARD or IMMUNOTHERAPY ALERT CARD  at check-in to the Emergency Department and triage nurse.  Should you have questions after your visit or need to cancel or reschedule your appointment, please contact [facility]  Dept: [phone]  and follow the prompts.  Office hours are 8:00 a.m. to 4:30 p.m. Monday - Friday. Please note that voicemails left after 4:00 p.m. may not be returned until the following business day.  We are closed weekends and major holidays. You have access to a nurse at all times for urgent questions. Please call the main number to the clinic Dept: [phone] and follow the prompts.  For any non-urgent questions, you may also contact your provider using MyChart. We now offer e-Visits for anyone 54 and older to request care online for non-urgent symptoms. For details visit [email or web address].   Also download the MyChart app! Go to the app store, search "MyChart", open the app, select [facility], and log in with your MyChart username and password.  Due to Covid, a mask is required upon entering the hospital/clinic. If you do not have a mask, one will be given to you upon arrival. For doctor visits, patients may have 1 support person aged 58 or older with them. For treatment visits, patients cannot have anyone with them due to current Covid guidelines and our immunocompromised population.

## 2020-06-30 NOTE — Progress Notes (Signed)
1427: PT STABLE AT TIME OF DISCHARGE

## 2020-07-05 DIAGNOSIS — J4520 Mild intermittent asthma, uncomplicated: Secondary | ICD-10-CM | POA: Diagnosis not present

## 2020-07-09 ENCOUNTER — Encounter: Payer: Self-pay | Admitting: Oncology

## 2020-07-19 DIAGNOSIS — M5416 Radiculopathy, lumbar region: Secondary | ICD-10-CM | POA: Diagnosis not present

## 2020-07-19 DIAGNOSIS — Z79899 Other long term (current) drug therapy: Secondary | ICD-10-CM | POA: Diagnosis not present

## 2020-07-19 DIAGNOSIS — B001 Herpesviral vesicular dermatitis: Secondary | ICD-10-CM | POA: Diagnosis not present

## 2020-07-19 DIAGNOSIS — E785 Hyperlipidemia, unspecified: Secondary | ICD-10-CM | POA: Diagnosis not present

## 2020-07-19 DIAGNOSIS — I10 Essential (primary) hypertension: Secondary | ICD-10-CM | POA: Diagnosis not present

## 2020-07-22 DIAGNOSIS — M542 Cervicalgia: Secondary | ICD-10-CM | POA: Diagnosis not present

## 2020-07-26 DIAGNOSIS — J4540 Moderate persistent asthma, uncomplicated: Secondary | ICD-10-CM | POA: Diagnosis not present

## 2020-08-04 DIAGNOSIS — J4520 Mild intermittent asthma, uncomplicated: Secondary | ICD-10-CM | POA: Diagnosis not present

## 2020-08-04 NOTE — Progress Notes (Signed)
[facility]  [address] [place],  [place]  [address] [phone]  Clinic Day:  08/09/2020  Referring physician: Janine Limbo, PA-C  This document serves as a record of services personally performed by Marice Potter, MD. It was created on their behalf by Curry,Lauren E, a trained medical scribe. The creation of this record is based on the scribe's personal observations and the provider's statements to them.  HISTORY OF PRESENT ILLNESS:  The patient is a 55 y.o. female with stage IVA lung adenocarcinoma.  This staging is based upon initial scans showing bilateral lung involvement with her cancer.  She comes in today to be evaluated before heading into her 16th cycle of pembrolizumab.  The patient received her 15th cycle of maintenance pembrolizumab 6 weeks ago.  She continues to tolerate all of her cycles very well.  Her fatigue is better since her immunotherapy has been spaced out to every 6 weeks.  From a lung cancer standpoint, she denies having any new respiratory symptoms which concern her for overt signs of disease progression.  Of note, she continues to work on a daily basis.  PHYSICAL EXAM:  Blood pressure 121/66, pulse 76, temperature 98 F (36.7 C), resp. rate 14, height 5\' 5"  (1.651 m), weight 106 lb (48.1 kg), SpO2 98 %. Wt Readings from Last 3 Encounters:  08/09/20 106 lb (48.1 kg)  06/30/20 107 lb (48.5 kg)  06/28/20 105 lb 8 oz (47.9 kg)   Body mass index is 17.64 kg/m. Performance status (ECOG): 1 - Symptomatic but completely ambulatory Physical Exam Constitutional:      Appearance: Normal appearance. She is not ill-appearing.  HENT:     Mouth/Throat:     Mouth: Mucous membranes are moist.     Pharynx: Oropharynx is clear. No oropharyngeal exudate or posterior oropharyngeal erythema.  Cardiovascular:     Rate and Rhythm: Normal rate and regular rhythm.     Heart sounds: No murmur heard.   No friction rub. No gallop.   Pulmonary:     Effort: Pulmonary effort is normal. No respiratory distress.     Breath sounds: Normal breath sounds. No wheezing, rhonchi or rales.  Chest:  Breasts:    Right: No axillary adenopathy or supraclavicular adenopathy.     Left: No axillary adenopathy or supraclavicular adenopathy.  Abdominal:     General: Bowel sounds are normal. There is no distension.     Palpations: Abdomen is soft. There is no mass.     Tenderness: There is no abdominal tenderness.  Musculoskeletal:        General: No swelling.     Right lower leg: No edema.     Left lower leg: No edema.  Lymphadenopathy:     Cervical: No cervical adenopathy.     Upper Body:     Right upper body: No supraclavicular or axillary adenopathy.     Left upper body: No supraclavicular or axillary adenopathy.     Lower Body: No right inguinal adenopathy. No left inguinal adenopathy.  Skin:    General: Skin is warm.     Coloration: Skin is not jaundiced.     Findings: No lesion or rash.  Neurological:     General: No focal deficit present.     Mental Status: She is alert and oriented to person, place, and time. Mental status is at baseline.     Cranial Nerves: Cranial nerves are intact.  Psychiatric:        Mood  and Affect: Mood normal.        Behavior: Behavior normal.        Thought Content: Thought content normal.   LABS:    ASSESSMENT & PLAN:  Assessment/Plan:  A 55 y.o. female with stage IVA lung adenocarcinoma.  She will proceed with her 16th cycle of pemobrolizumab this week.  Per her request, we will continue spacing all immunotherapy treatments out to every 6 weeks. Clinically, she appears to be doing very well.  I will see her back in 6 weeks before she heads into her 17th cycle of maintenance pembrolizumab immunotherapy.   CT scans will be done before then to ascertain her new disease baseline after 16 cycles of pembrolizumab.  The patient understands all the plans discussed today and is in agreement with  them.     I, Rita Ohara, am acting as scribe for Marice Potter, MD    I have reviewed this report as typed by the medical scribe, and it is complete and accurate.  Logan Vegh Macarthur Critchley, MD

## 2020-08-09 ENCOUNTER — Other Ambulatory Visit: Payer: Self-pay | Admitting: Hematology and Oncology

## 2020-08-09 ENCOUNTER — Inpatient Hospital Stay: Payer: Federal, State, Local not specified - PPO | Attending: Oncology | Admitting: Oncology

## 2020-08-09 ENCOUNTER — Encounter: Payer: Self-pay | Admitting: Oncology

## 2020-08-09 ENCOUNTER — Telehealth: Payer: Self-pay | Admitting: Oncology

## 2020-08-09 ENCOUNTER — Inpatient Hospital Stay: Payer: Federal, State, Local not specified - PPO

## 2020-08-09 ENCOUNTER — Other Ambulatory Visit: Payer: Self-pay | Admitting: Oncology

## 2020-08-09 VITALS — BP 121/66 | HR 76 | Temp 98.0°F | Resp 14 | Ht 65.0 in | Wt 106.0 lb

## 2020-08-09 DIAGNOSIS — C3481 Malignant neoplasm of overlapping sites of right bronchus and lung: Secondary | ICD-10-CM | POA: Insufficient documentation

## 2020-08-09 DIAGNOSIS — Z5112 Encounter for antineoplastic immunotherapy: Secondary | ICD-10-CM | POA: Insufficient documentation

## 2020-08-09 DIAGNOSIS — C3432 Malignant neoplasm of lower lobe, left bronchus or lung: Secondary | ICD-10-CM | POA: Insufficient documentation

## 2020-08-09 LAB — COMPREHENSIVE METABOLIC PANEL
Albumin: 4.4 (ref 3.5–5.0)
Calcium: 9.1 (ref 8.7–10.7)

## 2020-08-09 LAB — CBC AND DIFFERENTIAL
HCT: 43 (ref 36–46)
Hemoglobin: 14.6 (ref 12.0–16.0)
Neutrophils Absolute: 7.62
Platelets: 281 (ref 150–399)
WBC: 9.9

## 2020-08-09 LAB — BASIC METABOLIC PANEL
BUN: 7 (ref 4–21)
CO2: 27 — AB (ref 13–22)
Chloride: 103 (ref 99–108)
Creatinine: 0.5 (ref 0.5–1.1)
Glucose: 68
Potassium: 4.1 (ref 3.4–5.3)
Sodium: 138 (ref 137–147)

## 2020-08-09 LAB — HEPATIC FUNCTION PANEL
ALT: 17 (ref 7–35)
AST: 23 (ref 13–35)
Alkaline Phosphatase: 57 (ref 25–125)
Bilirubin, Total: 0.4

## 2020-08-09 LAB — CBC: RBC: 4.66 (ref 3.87–5.11)

## 2020-08-09 LAB — TSH: TSH: 1.76 (ref 0.41–5.90)

## 2020-08-09 NOTE — Telephone Encounter (Signed)
Per 7/18 los next appt scheduled and confirmed by patient

## 2020-08-09 NOTE — Progress Notes (Unsigned)
t

## 2020-08-11 ENCOUNTER — Inpatient Hospital Stay: Payer: Federal, State, Local not specified - PPO

## 2020-08-11 ENCOUNTER — Other Ambulatory Visit: Payer: Self-pay

## 2020-08-11 VITALS — BP 100/61 | HR 76 | Temp 98.7°F | Resp 18 | Ht 65.0 in | Wt 103.0 lb

## 2020-08-11 DIAGNOSIS — C3481 Malignant neoplasm of overlapping sites of right bronchus and lung: Secondary | ICD-10-CM | POA: Diagnosis not present

## 2020-08-11 DIAGNOSIS — C3432 Malignant neoplasm of lower lobe, left bronchus or lung: Secondary | ICD-10-CM | POA: Diagnosis not present

## 2020-08-11 DIAGNOSIS — Z5112 Encounter for antineoplastic immunotherapy: Secondary | ICD-10-CM | POA: Diagnosis not present

## 2020-08-11 MED ORDER — SODIUM CHLORIDE 0.9% FLUSH
10.0000 mL | INTRAVENOUS | Status: DC | PRN
  Administered 2020-08-11: 10 mL
  Filled 2020-08-11: qty 10

## 2020-08-11 MED ORDER — SODIUM CHLORIDE 0.9 % IV SOLN
Freq: Once | INTRAVENOUS | Status: AC
  Filled 2020-08-11: qty 250

## 2020-08-11 MED ORDER — SODIUM CHLORIDE 0.9 % IV SOLN
200.0000 mg | Freq: Once | INTRAVENOUS | Status: AC
  Administered 2020-08-11: 200 mg via INTRAVENOUS
  Filled 2020-08-11: qty 8

## 2020-08-11 MED ORDER — HEPARIN SOD (PORK) LOCK FLUSH 100 UNIT/ML IV SOLN
500.0000 [IU] | Freq: Once | INTRAVENOUS | Status: AC | PRN
  Administered 2020-08-11: 500 [IU]
  Filled 2020-08-11: qty 5

## 2020-08-11 NOTE — Patient Instructions (Signed)
[facility]  Discharge Instructions: Thank you for choosing [facility] to provide your oncology and hematology care.  If you have a lab appointment with the [facility], please go directly to the [facility] and check in at the registration area.   Wear comfortable clothing and clothing appropriate for easy access to any Portacath or PICC line.   We strive to give you quality time with your provider. You may need to reschedule your appointment if you arrive late (15 or more minutes).  Arriving late affects you and other patients whose appointments are after yours.  Also, if you miss three or more appointments without notifying the office, you may be dismissed from the clinic at the provider's discretion.      For prescription refill requests, have your pharmacy contact our office and allow 72 hours for refills to be completed.    Today you received the following chemotherapy and/or immunotherapy agents pembrolizumab      To help prevent nausea and vomiting after your treatment, we encourage you to take your nausea medication as directed.  BELOW ARE SYMPTOMS THAT SHOULD BE REPORTED IMMEDIATELY: *FEVER GREATER THAN 100.4 F (38 C) OR HIGHER *CHILLS OR SWEATING *NAUSEA AND VOMITING THAT IS NOT CONTROLLED WITH YOUR NAUSEA MEDICATION *UNUSUAL SHORTNESS OF BREATH *UNUSUAL BRUISING OR BLEEDING *URINARY PROBLEMS (pain or burning when urinating, or frequent urination) *BOWEL PROBLEMS (unusual diarrhea, constipation, pain near the anus) TENDERNESS IN MOUTH AND THROAT WITH OR WITHOUT PRESENCE OF ULCERS (sore throat, sores in mouth, or a toothache) UNUSUAL RASH, SWELLING OR PAIN  UNUSUAL VAGINAL DISCHARGE OR ITCHING   Items with * indicate a potential emergency and should be followed up as soon as possible or go to the Emergency Department if any problems should occur.  Please show the CHEMOTHERAPY ALERT CARD or IMMUNOTHERAPY ALERT CARD at check-in to the  Emergency Department and triage nurse.  Should you have questions after your visit or need to cancel or reschedule your appointment, please contact [facility]  Dept: [phone]  and follow the prompts.  Office hours are 8:00 a.m. to 4:30 p.m. Monday - Friday. Please note that voicemails left after 4:00 p.m. may not be returned until the following business day.  We are closed weekends and major holidays. You have access to a nurse at all times for urgent questions. Please call the main number to the clinic Dept: [phone] and follow the prompts.  For any non-urgent questions, you may also contact your provider using MyChart. We now offer e-Visits for anyone 48 and older to request care online for non-urgent symptoms. For details visit [email or web address].   Also download the MyChart app! Go to the app store, search "MyChart", open the app, select [facility], and log in with your MyChart username and password.  Due to Covid, a mask is required upon entering the hospital/clinic. If you do not have a mask, one will be given to you upon arrival. For doctor visits, patients may have 1 support person aged 14 or older with them. For treatment visits, patients cannot have anyone with them due to current Covid guidelines and our immunocompromised population.

## 2020-08-16 ENCOUNTER — Other Ambulatory Visit: Payer: Self-pay | Admitting: Hematology and Oncology

## 2020-08-16 DIAGNOSIS — C3481 Malignant neoplasm of overlapping sites of right bronchus and lung: Secondary | ICD-10-CM

## 2020-08-16 DIAGNOSIS — R0789 Other chest pain: Secondary | ICD-10-CM

## 2020-08-16 MED ORDER — HYDROCODONE-ACETAMINOPHEN 7.5-325 MG PO TABS: 1.0000 | ORAL_TABLET | Freq: Four times a day (QID) | ORAL | 0 refills | Status: DC | PRN

## 2020-08-18 ENCOUNTER — Encounter: Payer: Self-pay | Admitting: Oncology

## 2020-08-23 DIAGNOSIS — Z681 Body mass index (BMI) 19 or less, adult: Secondary | ICD-10-CM | POA: Diagnosis not present

## 2020-08-23 DIAGNOSIS — M5416 Radiculopathy, lumbar region: Secondary | ICD-10-CM | POA: Diagnosis not present

## 2020-08-26 DIAGNOSIS — J4540 Moderate persistent asthma, uncomplicated: Secondary | ICD-10-CM | POA: Diagnosis not present

## 2020-09-04 DIAGNOSIS — J4520 Mild intermittent asthma, uncomplicated: Secondary | ICD-10-CM | POA: Diagnosis not present

## 2020-09-07 ENCOUNTER — Telehealth: Payer: Self-pay | Admitting: Oncology

## 2020-09-10 NOTE — Progress Notes (Signed)
[facility]  [address] [place],  [place]  [address] [phone]  Clinic Day:  09/20/2020  Referring physician: Janine Limbo, PA-C  This document serves as a record of services personally performed by Marice Potter, MD. It was created on their behalf by Curry,Lauren E, a trained medical scribe. The creation of this record is based on the scribe's personal observations and the provider's statements to them.  HISTORY OF PRESENT ILLNESS:  The patient is a 55 y.o. female with stage IVA lung adenocarcinoma.  This staging is based upon initial scans showing bilateral lung involvement with her cancer.  She comes in today to review CT imaging results after receiving 16 cycles of pembrolizumab.  The patient received her 16th cycle of maintenance pembrolizumab 6 weeks ago.  She continues to tolerate all of her cycles very well.  Her fatigue is better since her immunotherapy has been spaced out to every 6 weeks.  From a lung cancer standpoint, she denies having any new respiratory symptoms which concern her for overt signs of disease progression.  Of note, she continues to work on a daily basis.  PHYSICAL EXAM:  Blood pressure 129/71, pulse 85, temperature 98.1 F (36.7 C), resp. rate 16, height 5\' 5"  (1.651 m), weight 103 lb 9.6 oz (47 kg), SpO2 96 %. Wt Readings from Last 3 Encounters:  09/20/20 103 lb 9.6 oz (47 kg)  08/11/20 103 lb (46.7 kg)  08/09/20 106 lb (48.1 kg)   Body mass index is 17.24 kg/m. Performance status (ECOG): 1 - Symptomatic but completely ambulatory Physical Exam Constitutional:      Appearance: Normal appearance. She is not ill-appearing.  HENT:     Mouth/Throat:     Mouth: Mucous membranes are moist.     Pharynx: Oropharynx is clear. No oropharyngeal exudate or posterior oropharyngeal erythema.  Cardiovascular:     Rate and Rhythm: Normal rate and regular rhythm.     Heart sounds: No murmur heard.   No friction rub. No gallop.   Pulmonary:     Effort: Pulmonary effort is normal. No respiratory distress.     Breath sounds: Normal breath sounds. No wheezing, rhonchi or rales.  Abdominal:     General: Bowel sounds are normal. There is no distension.     Palpations: Abdomen is soft. There is no mass.     Tenderness: There is no abdominal tenderness.  Musculoskeletal:        General: No swelling.     Right lower leg: No edema.     Left lower leg: No edema.  Lymphadenopathy:     Cervical: No cervical adenopathy.     Upper Body:     Right upper body: No supraclavicular or axillary adenopathy.     Left upper body: No supraclavicular or axillary adenopathy.     Lower Body: No right inguinal adenopathy. No left inguinal adenopathy.  Skin:    General: Skin is warm.     Coloration: Skin is not jaundiced.     Findings: No lesion or rash.  Neurological:     General: No focal deficit present.     Mental Status: She is alert and oriented to person, place, and time. Mental status is at baseline.     Cranial Nerves: Cranial nerves are intact.  Psychiatric:        Mood and Affect: Mood normal.        Behavior: Behavior normal.        Thought Content:  Thought content normal.   SCANS:  CT chest with contrast has revealed the following: FINDINGS: Cardiovascular: Port in the anterior chest wall with tip in distal SVC. Coronary artery calcification and aortic atherosclerotic calcification.  Mediastinum/Nodes: AP window node measures 13 mm compared to 13 mm. LEFT lower paratracheal node measures 14 mm compares with 13 mm. No hilar adenopathy  Lungs/Pleura: Centrilobular emphysema the upper lobes.  Resolution of the interstitial thickening at the lung bases.  Persistent nodules remain at the lung bases. For example nodule measuring 5 mm (image 103/4) comparison with 5 mm. Smaller 2 mm nodule in the RIGHT lower lobe on image 89 is unchanged. RIGHT upper lobe nodule measuring 4 mm (image 89/4) is also unchanged no  new nodularity.  Upper Abdomen: Limited view of the liver, kidneys, pancreas are unremarkable. Normal adrenal glands.  Musculoskeletal: No aggressive osseous lesion.  IMPRESSION: 1. Stable pulmonary nodules in the RIGHT lung. 2. Stable enlarged mediastinal lymph nodes. 3. Improved interstitial thickening at the lung bases. 4. Severe centrilobular emphysema. 5. Coronary artery calcification and Aortic Atherosclerosis (ICD10-I70.0).  LABS:   Ref. Range 09/20/2020 00:00  Sodium Latest Ref Range: 137 - 147  137  Potassium Latest Ref Range: 3.4 - 5.3  3.9  Chloride Latest Ref Range: 99 - 108  103  CO2 Latest Ref Range: 13 - 22  26 (A)  Glucose Unknown 74  BUN Latest Ref Range: 4 - 21  13  Creatinine Latest Ref Range: 0.5 - 1.1  0.7  Calcium Latest Ref Range: 8.7 - 10.7  9.0  Alkaline Phosphatase Latest Ref Range: 25 - 125  57  Albumin Latest Ref Range: 3.5 - 5.0  4.3  AST Latest Ref Range: 13 - 35  25  ALT Latest Ref Range: 7 - 35  16  Bilirubin, Total Unknown 0.6  WBC Unknown 8.4  RBC Latest Ref Range: 3.87 - 5.11  4.69  Hemoglobin Latest Ref Range: 12.0 - 16.0  14.7  HCT Latest Ref Range: 36 - 46  44  Platelets Latest Ref Range: 150 - 399  264  NEUT# Unknown 5.80    ASSESSMENT & PLAN:  Assessment/Plan:  A 55 y.o. female with stage IVA lung adenocarcinoma.  In clinic today, I reviewed her CT imaging results with her for which she could see her disease remains under ideal control.  In particular, the disease in her right lung base looks better.    She will proceed with her 17th cycle of pemobrolizumab this week.  Per her request, we will continue spacing all immunotherapy treatments out to every 6 weeks.  Clinically, she appears to be doing very well.  I will see her back in 6 weeks before she heads into her 18th cycle of maintenance pembrolizumab immunotherapy.  The patient understands all the plans discussed today and is in agreement with them.     I, Rita Ohara, am acting as  scribe for Marice Potter, MD    I have reviewed this report as typed by the medical scribe, and it is complete and accurate.  Dequincy Macarthur Critchley, MD

## 2020-09-17 DIAGNOSIS — R911 Solitary pulmonary nodule: Secondary | ICD-10-CM | POA: Diagnosis not present

## 2020-09-17 DIAGNOSIS — C3481 Malignant neoplasm of overlapping sites of right bronchus and lung: Secondary | ICD-10-CM | POA: Diagnosis not present

## 2020-09-17 DIAGNOSIS — I700 Atherosclerosis of aorta: Secondary | ICD-10-CM | POA: Diagnosis not present

## 2020-09-17 DIAGNOSIS — I2510 Atherosclerotic heart disease of native coronary artery without angina pectoris: Secondary | ICD-10-CM | POA: Diagnosis not present

## 2020-09-17 DIAGNOSIS — J432 Centrilobular emphysema: Secondary | ICD-10-CM | POA: Diagnosis not present

## 2020-09-17 DIAGNOSIS — R590 Localized enlarged lymph nodes: Secondary | ICD-10-CM | POA: Diagnosis not present

## 2020-09-17 DIAGNOSIS — Z79899 Other long term (current) drug therapy: Secondary | ICD-10-CM | POA: Diagnosis not present

## 2020-09-17 DIAGNOSIS — R918 Other nonspecific abnormal finding of lung field: Secondary | ICD-10-CM | POA: Diagnosis not present

## 2020-09-17 DIAGNOSIS — J439 Emphysema, unspecified: Secondary | ICD-10-CM | POA: Diagnosis not present

## 2020-09-20 ENCOUNTER — Encounter: Payer: Self-pay | Admitting: Oncology

## 2020-09-20 ENCOUNTER — Inpatient Hospital Stay: Payer: Federal, State, Local not specified - PPO | Attending: Oncology | Admitting: Oncology

## 2020-09-20 ENCOUNTER — Telehealth: Payer: Self-pay | Admitting: Oncology

## 2020-09-20 VITALS — BP 129/71 | HR 85 | Temp 98.1°F | Resp 16 | Ht 65.0 in | Wt 103.6 lb

## 2020-09-20 DIAGNOSIS — C3481 Malignant neoplasm of overlapping sites of right bronchus and lung: Secondary | ICD-10-CM | POA: Diagnosis not present

## 2020-09-20 DIAGNOSIS — Z5112 Encounter for antineoplastic immunotherapy: Secondary | ICD-10-CM | POA: Insufficient documentation

## 2020-09-20 DIAGNOSIS — C3432 Malignant neoplasm of lower lobe, left bronchus or lung: Secondary | ICD-10-CM | POA: Insufficient documentation

## 2020-09-20 LAB — HEPATIC FUNCTION PANEL
ALT: 16 (ref 7–35)
AST: 25 (ref 13–35)
Alkaline Phosphatase: 57 (ref 25–125)
Bilirubin, Total: 0.6

## 2020-09-20 LAB — TSH: TSH: 1.82 (ref 0.41–5.90)

## 2020-09-20 LAB — CBC AND DIFFERENTIAL
HCT: 44 (ref 36–46)
Hemoglobin: 14.7 (ref 12.0–16.0)
Neutrophils Absolute: 5.8
Platelets: 264 (ref 150–399)
WBC: 8.4

## 2020-09-20 LAB — COMPREHENSIVE METABOLIC PANEL
Albumin: 4.3 (ref 3.5–5.0)
Calcium: 9 (ref 8.7–10.7)

## 2020-09-20 LAB — BASIC METABOLIC PANEL
BUN: 13 (ref 4–21)
CO2: 26 — AB (ref 13–22)
Chloride: 103 (ref 99–108)
Creatinine: 0.7 (ref 0.5–1.1)
Glucose: 74
Potassium: 3.9 (ref 3.4–5.3)
Sodium: 137 (ref 137–147)

## 2020-09-20 LAB — CBC: RBC: 4.69 (ref 3.87–5.11)

## 2020-09-20 NOTE — Telephone Encounter (Signed)
Per 8/29 LOS, patient scheduled for Oct Appt's

## 2020-09-21 ENCOUNTER — Encounter: Payer: Self-pay | Admitting: Oncology

## 2020-09-21 MED FILL — Pembrolizumab IV Soln 100 MG/4ML (25 MG/ML): INTRAVENOUS | Qty: 8 | Status: AC

## 2020-09-22 ENCOUNTER — Other Ambulatory Visit: Payer: Self-pay

## 2020-09-22 ENCOUNTER — Encounter: Payer: Self-pay | Admitting: Oncology

## 2020-09-22 ENCOUNTER — Inpatient Hospital Stay: Payer: Federal, State, Local not specified - PPO

## 2020-09-22 VITALS — BP 105/73 | HR 87 | Temp 98.2°F | Resp 18 | Ht 65.0 in | Wt 104.0 lb

## 2020-09-22 DIAGNOSIS — C3481 Malignant neoplasm of overlapping sites of right bronchus and lung: Secondary | ICD-10-CM

## 2020-09-22 DIAGNOSIS — C3432 Malignant neoplasm of lower lobe, left bronchus or lung: Secondary | ICD-10-CM | POA: Diagnosis not present

## 2020-09-22 DIAGNOSIS — Z5112 Encounter for antineoplastic immunotherapy: Secondary | ICD-10-CM | POA: Diagnosis not present

## 2020-09-22 MED ORDER — SODIUM CHLORIDE 0.9% FLUSH
10.0000 mL | INTRAVENOUS | Status: DC | PRN
  Administered 2020-09-22: 10 mL

## 2020-09-22 MED ORDER — HEPARIN SOD (PORK) LOCK FLUSH 100 UNIT/ML IV SOLN
500.0000 [IU] | Freq: Once | INTRAVENOUS | Status: AC | PRN
  Administered 2020-09-22: 500 [IU]

## 2020-09-22 MED ORDER — SODIUM CHLORIDE 0.9 % IV SOLN: Freq: Once | INTRAVENOUS | Status: AC

## 2020-09-22 MED ORDER — SODIUM CHLORIDE 0.9 % IV SOLN
200.0000 mg | Freq: Once | INTRAVENOUS | Status: AC
  Administered 2020-09-22: 200 mg via INTRAVENOUS
  Filled 2020-09-22: qty 8

## 2020-09-22 NOTE — Patient Instructions (Signed)
[facility]  Discharge Instructions: Thank you for choosing [facility] to provide your oncology and hematology care.  If you have a lab appointment with the [facility], please go directly to the [facility] and check in at the registration area.   Wear comfortable clothing and clothing appropriate for easy access to any Portacath or PICC line.   We strive to give you quality time with your provider. You may need to reschedule your appointment if you arrive late (15 or more minutes).  Arriving late affects you and other patients whose appointments are after yours.  Also, if you miss three or more appointments without notifying the office, you may be dismissed from the clinic at the provider's discretion.      For prescription refill requests, have your pharmacy contact our office and allow 72 hours for refills to be completed.    Today you received the following chemotherapy and/or immunotherapy agents [name]   To help prevent nausea and vomiting after your treatment, we encourage you to take your nausea medication as directed.  BELOW ARE SYMPTOMS THAT SHOULD BE REPORTED IMMEDIATELY: *FEVER GREATER THAN 100.4 F (38 C) OR HIGHER *CHILLS OR SWEATING *NAUSEA AND VOMITING THAT IS NOT CONTROLLED WITH YOUR NAUSEA MEDICATION *UNUSUAL SHORTNESS OF BREATH *UNUSUAL BRUISING OR BLEEDING *URINARY PROBLEMS (pain or burning when urinating, or frequent urination) *BOWEL PROBLEMS (unusual diarrhea, constipation, pain near the anus) TENDERNESS IN MOUTH AND THROAT WITH OR WITHOUT PRESENCE OF ULCERS (sore throat, sores in mouth, or a toothache) UNUSUAL RASH, SWELLING OR PAIN  UNUSUAL VAGINAL DISCHARGE OR ITCHING   Items with * indicate a potential emergency and should be followed up as soon as possible or go to the Emergency Department if any problems should occur.  Please show the CHEMOTHERAPY ALERT CARD or IMMUNOTHERAPY ALERT CARD at check-in to the  Emergency Department and triage nurse.  Should you have questions after your visit or need to cancel or reschedule your appointment, please contact [facility]  Dept: [phone]  and follow the prompts.  Office hours are 8:00 a.m. to 4:30 p.m. Monday - Friday. Please note that voicemails left after 4:00 p.m. may not be returned until the following business day.  We are closed weekends and major holidays. You have access to a nurse at all times for urgent questions. Please call the main number to the clinic Dept: [phone] and follow the prompts.  For any non-urgent questions, you may also contact your provider using MyChart. We now offer e-Visits for anyone 7 and older to request care online for non-urgent symptoms. For details visit [email or web address].   Also download the MyChart app! Go to the app store, search "MyChart", open the app, select [facility], and log in with your MyChart username and password.  Due to Covid, a mask is required upon entering the hospital/clinic. If you do not have a mask, one will be given to you upon arrival. For doctor visits, patients may have 1 support person aged 69 or older with them. For treatment visits, patients cannot have anyone with them due to current Covid guidelines and our immunocompromised population.   Pembrolizumab injection What is this medication? PEMBROLIZUMAB (pem broe liz ue mab) is a monoclonal antibody. It is used to treat certain types of cancer. This medicine may be used for other purposes; ask your health care provider or pharmacist if you have questions. COMMON BRAND NAME(S): Keytruda What should I tell my care  team before I take this medication? They need to know if you have any of these conditions: autoimmune diseases like Crohn's disease, ulcerative colitis, or lupus have had or planning to have an allogeneic stem cell transplant (uses someone else's stem cells) history of organ transplant history  of chest radiation nervous system problems like myasthenia gravis or Guillain-Barre syndrome an unusual or allergic reaction to pembrolizumab, other medicines, foods, dyes, or preservatives pregnant or trying to get pregnant breast-feeding How should I use this medication? This medicine is for infusion into a vein. It is given by a health care professional in a hospital or clinic setting. A special MedGuide will be given to you before each treatment. Be sure to read this information carefully each time. Talk to your pediatrician regarding the use of this medicine in children. While this drug may be prescribed for children as young as 6 months for selected conditions, precautions do apply. Overdosage: If you think you have taken too much of this medicine contact a poison control center or emergency room at once. NOTE: This medicine is only for you. Do not share this medicine with others. What if I miss a dose? It is important not to miss your dose. Call your doctor or health care professional if you are unable to keep an appointment. What may interact with this medication? Interactions have not been studied. This list may not describe all possible interactions. Give your health care provider a list of all the medicines, herbs, non-prescription drugs, or dietary supplements you use. Also tell them if you smoke, drink alcohol, or use illegal drugs. Some items may interact with your medicine. What should I watch for while using this medication? Your condition will be monitored carefully while you are receiving this medicine. You may need blood work done while you are taking this medicine. Do not become pregnant while taking this medicine or for 4 months after stopping it. Women should inform their doctor if they wish to become pregnant or think they might be pregnant. There is a potential for serious side effects to an unborn child. Talk to your health care professional or pharmacist for more  information. Do not breast-feed an infant while taking this medicine or for 4 months after the last dose. What side effects may I notice from receiving this medication? Side effects that you should report to your doctor or health care professional as soon as possible: allergic reactions like skin rash, itching or hives, swelling of the face, lips, or tongue bloody or black, tarry breathing problems changes in vision chest pain chills confusion constipation cough diarrhea dizziness or feeling faint or lightheaded fast or irregular heartbeat fever flushing joint pain low blood counts - this medicine may decrease the number of white blood cells, red blood cells and platelets. You may be at increased risk for infections and bleeding. muscle pain muscle weakness pain, tingling, numbness in the hands or feet persistent headache redness, blistering, peeling or loosening of the skin, including inside the mouth signs and symptoms of high blood sugar such as dizziness; dry mouth; dry skin; fruity breath; nausea; stomach pain; increased hunger or thirst; increased urination signs and symptoms of kidney injury like trouble passing urine or change in the amount of urine signs and symptoms of liver injury like dark urine, light-colored stools, loss of appetite, nausea, right upper belly pain, yellowing of the eyes or skin sweating swollen lymph nodes weight loss Side effects that usually do not require medical attention (report to your doctor  or health care professional if they continue or are bothersome): decreased appetite hair loss tiredness This list may not describe all possible side effects. Call your doctor for medical advice about side effects. You may report side effects to FDA at 1-800-FDA-1088. Where should I keep my medication? This drug is given in a hospital or clinic and will not be stored at home. NOTE: This sheet is a summary. It may not cover all possible information. If you  have questions about this medicine, talk to your doctor, pharmacist, or health care provider.  2022 Elsevier/Gold Standard (2018-12-11 21:44:53)

## 2020-09-26 DIAGNOSIS — J4540 Moderate persistent asthma, uncomplicated: Secondary | ICD-10-CM | POA: Diagnosis not present

## 2020-10-05 DIAGNOSIS — J4520 Mild intermittent asthma, uncomplicated: Secondary | ICD-10-CM | POA: Diagnosis not present

## 2020-10-11 ENCOUNTER — Other Ambulatory Visit: Payer: Self-pay | Admitting: Hematology and Oncology

## 2020-10-11 ENCOUNTER — Other Ambulatory Visit: Payer: Self-pay

## 2020-10-11 DIAGNOSIS — R0789 Other chest pain: Secondary | ICD-10-CM

## 2020-10-11 DIAGNOSIS — C3481 Malignant neoplasm of overlapping sites of right bronchus and lung: Secondary | ICD-10-CM

## 2020-10-11 MED ORDER — HYDROCODONE-ACETAMINOPHEN 7.5-325 MG PO TABS: 1.0000 | ORAL_TABLET | Freq: Four times a day (QID) | ORAL | 0 refills | Status: DC | PRN

## 2020-10-22 NOTE — Progress Notes (Signed)
[facility]  [address] [place],  [place]  [address] [phone]  Clinic Day:  11/01/2020  Referring physician: Janine Limbo, PA-C  This document serves as a record of services personally performed by Marice Potter, MD. It was created on their behalf by Curry,Lauren E, a trained medical scribe. The creation of this record is based on the scribe's personal observations and the provider's statements to them.  HISTORY OF PRESENT ILLNESS:  The patient is a 55 y.o. female with stage IVA lung adenocarcinoma.  This staging is based upon initial scans showing bilateral lung involvement with her cancer.  She comes in today prior to a 18th cycle of pembrolizumab.  The patient received her 17th cycle of maintenance pembrolizumab 6 weeks ago.  She continues to tolerate all of her cycles very well.  Her fatigue has been better since her immunotherapy has been spaced out to every 6 weeks.  From a lung cancer standpoint, she denies having any new respiratory symptoms which concern her for overt signs of disease progression.  Of note, she continues to work on a daily basis.  PHYSICAL EXAM:  Blood pressure 132/81, pulse 79, temperature 98 F (36.7 C), resp. rate 16, height 5\' 5"  (1.651 m), weight 105 lb 8 oz (47.9 kg), SpO2 98 %. Wt Readings from Last 3 Encounters:  11/01/20 105 lb 8 oz (47.9 kg)  09/22/20 104 lb (47.2 kg)  09/20/20 103 lb 9.6 oz (47 kg)   Body mass index is 17.56 kg/m. Performance status (ECOG): 1 - Symptomatic but completely ambulatory Physical Exam Constitutional:      Appearance: Normal appearance. She is not ill-appearing.  HENT:     Mouth/Throat:     Mouth: Mucous membranes are moist.     Pharynx: Oropharynx is clear. No oropharyngeal exudate or posterior oropharyngeal erythema.  Cardiovascular:     Rate and Rhythm: Normal rate and regular rhythm.     Heart sounds: No murmur heard.   No friction rub. No gallop.  Pulmonary:      Effort: Pulmonary effort is normal. No respiratory distress.     Breath sounds: Normal breath sounds. No wheezing, rhonchi or rales.  Abdominal:     General: Bowel sounds are normal. There is no distension.     Palpations: Abdomen is soft. There is no mass.     Tenderness: There is no abdominal tenderness.  Musculoskeletal:        General: No swelling.     Right lower leg: No edema.     Left lower leg: No edema.  Lymphadenopathy:     Cervical: No cervical adenopathy.     Upper Body:     Right upper body: No supraclavicular or axillary adenopathy.     Left upper body: No supraclavicular or axillary adenopathy.     Lower Body: No right inguinal adenopathy. No left inguinal adenopathy.  Skin:    General: Skin is warm.     Coloration: Skin is not jaundiced.     Findings: No lesion or rash.  Neurological:     General: No focal deficit present.     Mental Status: She is alert and oriented to person, place, and time. Mental status is at baseline.     Cranial Nerves: Cranial nerves are intact.  Psychiatric:        Mood and Affect: Mood normal.        Behavior: Behavior normal.        Thought Content: Thought  content normal.   LABS:    Ref. Range 11/01/2020 00:00  Sodium Latest Ref Range: 137 - 147  138  Potassium Latest Ref Range: 3.4 - 5.3  4.0  Chloride Latest Ref Range: 99 - 108  109 (A)  CO2 Latest Ref Range: 13 - 22  22  Glucose Unknown 94  BUN Latest Ref Range: 4 - 21  9  Creatinine Latest Ref Range: 0.5 - 1.1  0.6  Calcium Latest Ref Range: 8.7 - 10.7  9.0  Alkaline Phosphatase Latest Ref Range: 25 - 125  51  Albumin Latest Ref Range: 3.5 - 5.0  4.2  AST Latest Ref Range: 13 - 35  23  ALT Latest Ref Range: 7 - 35  14  Bilirubin, Total Unknown 0.3  WBC Unknown 7.6  RBC Latest Ref Range: 3.87 - 5.11  4.75  Hemoglobin Latest Ref Range: 12.0 - 16.0  14.8  HCT Latest Ref Range: 36 - 46  44  Platelets Latest Ref Range: 150 - 399  271  NEUT# Unknown 5.09    Ref. Range  11/01/2020 10:08  TSH Latest Ref Range: 0.350 - 4.500 uIU/mL 2.394    ASSESSMENT & PLAN:  Assessment/Plan:  A 55 y.o. female with stage IVA lung adenocarcinoma.  She will proceed with her 18th cycle of pemobrolizumab this week.  Per her request, we will continue spacing all immunotherapy treatments out to every 6 weeks.  Clinically, she appears to be doing very well.  I will see her back in 6 weeks before she heads into her 19th cycle of maintenance pembrolizumab immunotherapy.  The patient understands all the plans discussed today and is in agreement with them.     I, Rita Ohara, am acting as scribe for Marice Potter, MD    I have reviewed this report as typed by the medical scribe, and it is complete and accurate.  Dequincy Macarthur Critchley, MD

## 2020-10-26 DIAGNOSIS — J4540 Moderate persistent asthma, uncomplicated: Secondary | ICD-10-CM | POA: Diagnosis not present

## 2020-11-01 ENCOUNTER — Other Ambulatory Visit: Payer: Self-pay | Admitting: Hematology and Oncology

## 2020-11-01 ENCOUNTER — Inpatient Hospital Stay: Payer: Federal, State, Local not specified - PPO | Attending: Oncology

## 2020-11-01 ENCOUNTER — Inpatient Hospital Stay (INDEPENDENT_AMBULATORY_CARE_PROVIDER_SITE_OTHER): Payer: Federal, State, Local not specified - PPO | Admitting: Oncology

## 2020-11-01 ENCOUNTER — Telehealth: Payer: Self-pay | Admitting: Oncology

## 2020-11-01 VITALS — BP 132/81 | HR 79 | Temp 98.0°F | Resp 16 | Ht 65.0 in | Wt 105.5 lb

## 2020-11-01 DIAGNOSIS — Z5112 Encounter for antineoplastic immunotherapy: Secondary | ICD-10-CM | POA: Insufficient documentation

## 2020-11-01 DIAGNOSIS — C3432 Malignant neoplasm of lower lobe, left bronchus or lung: Secondary | ICD-10-CM | POA: Diagnosis not present

## 2020-11-01 DIAGNOSIS — C3481 Malignant neoplasm of overlapping sites of right bronchus and lung: Secondary | ICD-10-CM

## 2020-11-01 DIAGNOSIS — Z79899 Other long term (current) drug therapy: Secondary | ICD-10-CM | POA: Insufficient documentation

## 2020-11-01 LAB — CBC AND DIFFERENTIAL
HCT: 44 (ref 36–46)
Hemoglobin: 14.8 (ref 12.0–16.0)
Neutrophils Absolute: 5.09
Platelets: 271 (ref 150–399)
WBC: 7.6

## 2020-11-01 LAB — COMPREHENSIVE METABOLIC PANEL
Albumin: 4.2 (ref 3.5–5.0)
Calcium: 9 (ref 8.7–10.7)

## 2020-11-01 LAB — BASIC METABOLIC PANEL
BUN: 9 (ref 4–21)
CO2: 22 (ref 13–22)
Chloride: 109 — AB (ref 99–108)
Creatinine: 0.6 (ref 0.5–1.1)
Glucose: 94
Potassium: 4 (ref 3.4–5.3)
Sodium: 138 (ref 137–147)

## 2020-11-01 LAB — HEPATIC FUNCTION PANEL
ALT: 14 (ref 7–35)
AST: 23 (ref 13–35)
Alkaline Phosphatase: 51 (ref 25–125)
Bilirubin, Total: 0.3

## 2020-11-01 LAB — CBC: RBC: 4.75 (ref 3.87–5.11)

## 2020-11-01 LAB — TSH: TSH: 2.394 u[IU]/mL (ref 0.350–4.500)

## 2020-11-01 NOTE — Telephone Encounter (Signed)
Per 10/7 LOS, patient scheduled for Nov Appt's.  Gave patient Appt Calendar

## 2020-11-02 LAB — T4: T4, Total: 6.2 ug/dL (ref 4.5–12.0)

## 2020-11-02 MED FILL — Pembrolizumab IV Soln 100 MG/4ML (25 MG/ML): INTRAVENOUS | Qty: 8 | Status: AC

## 2020-11-03 ENCOUNTER — Other Ambulatory Visit: Payer: Self-pay

## 2020-11-03 ENCOUNTER — Inpatient Hospital Stay: Payer: Federal, State, Local not specified - PPO

## 2020-11-03 VITALS — BP 125/61 | HR 74 | Temp 98.2°F | Resp 18 | Ht 65.0 in | Wt 104.0 lb

## 2020-11-03 DIAGNOSIS — C3481 Malignant neoplasm of overlapping sites of right bronchus and lung: Secondary | ICD-10-CM

## 2020-11-03 DIAGNOSIS — Z79899 Other long term (current) drug therapy: Secondary | ICD-10-CM | POA: Diagnosis not present

## 2020-11-03 DIAGNOSIS — Z5112 Encounter for antineoplastic immunotherapy: Secondary | ICD-10-CM | POA: Diagnosis not present

## 2020-11-03 DIAGNOSIS — C3432 Malignant neoplasm of lower lobe, left bronchus or lung: Secondary | ICD-10-CM | POA: Diagnosis not present

## 2020-11-03 MED ORDER — SODIUM CHLORIDE 0.9 % IV SOLN
Freq: Once | INTRAVENOUS | Status: AC
Start: 2020-11-03 — End: 2020-11-03

## 2020-11-03 MED ORDER — HEPARIN SOD (PORK) LOCK FLUSH 100 UNIT/ML IV SOLN
500.0000 [IU] | Freq: Once | INTRAVENOUS | Status: AC | PRN
  Administered 2020-11-03: 500 [IU]

## 2020-11-03 MED ORDER — SODIUM CHLORIDE 0.9% FLUSH
10.0000 mL | INTRAVENOUS | Status: DC | PRN
  Administered 2020-11-03: 10 mL

## 2020-11-03 MED ORDER — SODIUM CHLORIDE 0.9 % IV SOLN
200.0000 mg | Freq: Once | INTRAVENOUS | Status: AC
  Administered 2020-11-03: 200 mg via INTRAVENOUS
  Filled 2020-11-03: qty 8

## 2020-11-03 NOTE — Patient Instructions (Signed)
[facility]  Discharge Instructions: Thank you for choosing [facility] to provide your oncology and hematology care.  If you have a lab appointment with the [facility], please go directly to the [facility] and check in at the registration area.   Wear comfortable clothing and clothing appropriate for easy access to any Portacath or PICC line.   We strive to give you quality time with your provider. You may need to reschedule your appointment if you arrive late (15 or more minutes).  Arriving late affects you and other patients whose appointments are after yours.  Also, if you miss three or more appointments without notifying the office, you may be dismissed from the clinic at the provider's discretion.      For prescription refill requests, have your pharmacy contact our office and allow 72 hours for refills to be completed.    Today you received the following chemotherapy and/or immunotherapy agents [name]   To help prevent nausea and vomiting after your treatment, we encourage you to take your nausea medication as directed.  BELOW ARE SYMPTOMS THAT SHOULD BE REPORTED IMMEDIATELY: *FEVER GREATER THAN 100.4 F (38 C) OR HIGHER *CHILLS OR SWEATING *NAUSEA AND VOMITING THAT IS NOT CONTROLLED WITH YOUR NAUSEA MEDICATION *UNUSUAL SHORTNESS OF BREATH *UNUSUAL BRUISING OR BLEEDING *URINARY PROBLEMS (pain or burning when urinating, or frequent urination) *BOWEL PROBLEMS (unusual diarrhea, constipation, pain near the anus) TENDERNESS IN MOUTH AND THROAT WITH OR WITHOUT PRESENCE OF ULCERS (sore throat, sores in mouth, or a toothache) UNUSUAL RASH, SWELLING OR PAIN  UNUSUAL VAGINAL DISCHARGE OR ITCHING   Items with * indicate a potential emergency and should be followed up as soon as possible or go to the Emergency Department if any problems should occur.  Please show the CHEMOTHERAPY ALERT CARD or IMMUNOTHERAPY ALERT CARD at check-in to the  Emergency Department and triage nurse.  Should you have questions after your visit or need to cancel or reschedule your appointment, please contact [facility]  Dept: [phone]  and follow the prompts.  Office hours are 8:00 a.m. to 4:30 p.m. Monday - Friday. Please note that voicemails left after 4:00 p.m. may not be returned until the following business day.  We are closed weekends and major holidays. You have access to a nurse at all times for urgent questions. Please call the main number to the clinic Dept: [phone] and follow the prompts.  For any non-urgent questions, you may also contact your provider using MyChart. We now offer e-Visits for anyone 70 and older to request care online for non-urgent symptoms. For details visit [email or web address].   Also download the MyChart app! Go to the app store, search "MyChart", open the app, select [facility], and log in with your MyChart username and password.  Due to Covid, a mask is required upon entering the hospital/clinic. If you do not have a mask, one will be given to you upon arrival. For doctor visits, patients may have 1 support person aged 6 or older with them. For treatment visits, patients cannot have anyone with them due to current Covid guidelines and our immunocompromised population.   Pembrolizumab injection What is this medication? PEMBROLIZUMAB (pem broe liz ue mab) is a monoclonal antibody. It is used to treat certain types of cancer. This medicine may be used for other purposes; ask your health care provider or pharmacist if you have questions. COMMON BRAND NAME(S): Keytruda What should I tell my care  team before I take this medication? They need to know if you have any of these conditions: autoimmune diseases like Crohn's disease, ulcerative colitis, or lupus have had or planning to have an allogeneic stem cell transplant (uses someone else's stem cells) history of organ transplant history  of chest radiation nervous system problems like myasthenia gravis or Guillain-Barre syndrome an unusual or allergic reaction to pembrolizumab, other medicines, foods, dyes, or preservatives pregnant or trying to get pregnant breast-feeding How should I use this medication? This medicine is for infusion into a vein. It is given by a health care professional in a hospital or clinic setting. A special MedGuide will be given to you before each treatment. Be sure to read this information carefully each time. Talk to your pediatrician regarding the use of this medicine in children. While this drug may be prescribed for children as young as 6 months for selected conditions, precautions do apply. Overdosage: If you think you have taken too much of this medicine contact a poison control center or emergency room at once. NOTE: This medicine is only for you. Do not share this medicine with others. What if I miss a dose? It is important not to miss your dose. Call your doctor or health care professional if you are unable to keep an appointment. What may interact with this medication? Interactions have not been studied. This list may not describe all possible interactions. Give your health care provider a list of all the medicines, herbs, non-prescription drugs, or dietary supplements you use. Also tell them if you smoke, drink alcohol, or use illegal drugs. Some items may interact with your medicine. What should I watch for while using this medication? Your condition will be monitored carefully while you are receiving this medicine. You may need blood work done while you are taking this medicine. Do not become pregnant while taking this medicine or for 4 months after stopping it. Women should inform their doctor if they wish to become pregnant or think they might be pregnant. There is a potential for serious side effects to an unborn child. Talk to your health care professional or pharmacist for more  information. Do not breast-feed an infant while taking this medicine or for 4 months after the last dose. What side effects may I notice from receiving this medication? Side effects that you should report to your doctor or health care professional as soon as possible: allergic reactions like skin rash, itching or hives, swelling of the face, lips, or tongue bloody or black, tarry breathing problems changes in vision chest pain chills confusion constipation cough diarrhea dizziness or feeling faint or lightheaded fast or irregular heartbeat fever flushing joint pain low blood counts - this medicine may decrease the number of white blood cells, red blood cells and platelets. You may be at increased risk for infections and bleeding. muscle pain muscle weakness pain, tingling, numbness in the hands or feet persistent headache redness, blistering, peeling or loosening of the skin, including inside the mouth signs and symptoms of high blood sugar such as dizziness; dry mouth; dry skin; fruity breath; nausea; stomach pain; increased hunger or thirst; increased urination signs and symptoms of kidney injury like trouble passing urine or change in the amount of urine signs and symptoms of liver injury like dark urine, light-colored stools, loss of appetite, nausea, right upper belly pain, yellowing of the eyes or skin sweating swollen lymph nodes weight loss Side effects that usually do not require medical attention (report to your doctor  or health care professional if they continue or are bothersome): decreased appetite hair loss tiredness This list may not describe all possible side effects. Call your doctor for medical advice about side effects. You may report side effects to FDA at 1-800-FDA-1088. Where should I keep my medication? This drug is given in a hospital or clinic and will not be stored at home. NOTE: This sheet is a summary. It may not cover all possible information. If you  have questions about this medicine, talk to your doctor, pharmacist, or health care provider.  2022 Elsevier/Gold Standard (2018-12-11 21:44:53)

## 2020-11-04 DIAGNOSIS — J4520 Mild intermittent asthma, uncomplicated: Secondary | ICD-10-CM | POA: Diagnosis not present

## 2020-11-26 DIAGNOSIS — J4540 Moderate persistent asthma, uncomplicated: Secondary | ICD-10-CM | POA: Diagnosis not present

## 2020-12-01 ENCOUNTER — Other Ambulatory Visit: Payer: Self-pay

## 2020-12-01 DIAGNOSIS — C3481 Malignant neoplasm of overlapping sites of right bronchus and lung: Secondary | ICD-10-CM

## 2020-12-01 DIAGNOSIS — R0789 Other chest pain: Secondary | ICD-10-CM

## 2020-12-01 MED ORDER — HYDROCODONE-ACETAMINOPHEN 7.5-325 MG PO TABS: 1.0000 | ORAL_TABLET | Freq: Four times a day (QID) | ORAL | 0 refills | Status: DC | PRN

## 2020-12-02 ENCOUNTER — Encounter: Payer: Self-pay | Admitting: Oncology

## 2020-12-05 DIAGNOSIS — J4520 Mild intermittent asthma, uncomplicated: Secondary | ICD-10-CM | POA: Diagnosis not present

## 2020-12-06 NOTE — Progress Notes (Signed)
[facility]  [address] [place],  [place]  [address] [phone]  Clinic Day:  12/13/2020  Referring physician: Janine Limbo, PA-C  This document serves as a record of services personally performed by Marice Potter, MD. It was created on their behalf by Curry,Lauren E, a trained medical scribe. The creation of this record is based on the scribe's personal observations and the provider's statements to them.  HISTORY OF PRESENT ILLNESS:  The patient is a 55 y.o. female with stage IVA lung adenocarcinoma.  This staging is based upon initial scans showing bilateral lung involvement with her cancer.  She comes in today prior to her 19th cycle of pembrolizumab.  The patient received her 18th cycle of maintenance pembrolizumab 6 weeks ago.  She continues to tolerate all of her cycles very well.  Her fatigue has been better since her immunotherapy has been spaced out to every 6 weeks.  From a lung cancer standpoint, she denies having any new respiratory symptoms which concern her for overt signs of disease progression.  Of note, she continues to work on a daily basis.  PHYSICAL EXAM:  Blood pressure 123/68, pulse 66, temperature 98.1 F (36.7 C), resp. rate 14, height 5\' 5"  (1.651 m), weight 104 lb 9.6 oz (47.4 kg), SpO2 96 %. Wt Readings from Last 3 Encounters:  12/13/20 104 lb 9.6 oz (47.4 kg)  11/03/20 104 lb (47.2 kg)  11/01/20 105 lb 8 oz (47.9 kg)   Body mass index is 17.41 kg/m. Performance status (ECOG): 1 - Symptomatic but completely ambulatory Physical Exam Constitutional:      Appearance: Normal appearance. She is not ill-appearing.  HENT:     Mouth/Throat:     Mouth: Mucous membranes are moist.     Pharynx: Oropharynx is clear. No oropharyngeal exudate or posterior oropharyngeal erythema.  Cardiovascular:     Rate and Rhythm: Normal rate and regular rhythm.     Heart sounds: No murmur heard.   No friction rub. No gallop.  Pulmonary:      Effort: Pulmonary effort is normal. No respiratory distress.     Breath sounds: Normal breath sounds. No wheezing, rhonchi or rales.  Abdominal:     General: Bowel sounds are normal. There is no distension.     Palpations: Abdomen is soft. There is no mass.     Tenderness: There is no abdominal tenderness.  Musculoskeletal:        General: No swelling.     Right lower leg: No edema.     Left lower leg: No edema.  Lymphadenopathy:     Cervical: No cervical adenopathy.     Upper Body:     Right upper body: No supraclavicular or axillary adenopathy.     Left upper body: No supraclavicular or axillary adenopathy.     Lower Body: No right inguinal adenopathy. No left inguinal adenopathy.  Skin:    General: Skin is warm.     Coloration: Skin is not jaundiced.     Findings: No lesion or rash.  Neurological:     General: No focal deficit present.     Mental Status: She is alert and oriented to person, place, and time. Mental status is at baseline.  Psychiatric:        Mood and Affect: Mood normal.        Behavior: Behavior normal.        Thought Content: Thought content normal.   LABS:    Latest Reference  Range & Units 12/13/20 00:00  Sodium 137 - 147  135 ! (E)  Potassium 3.4 - 5.3  4.3 (E)  Chloride 99 - 108  104 (E)  CO2 13 - 22  25 ! (E)  Glucose  82 (E)  BUN 4 - 21  9 (E)  Creatinine 0.5 - 1.1  0.6 (E)  Calcium 8.7 - 10.7  8.7 (E)  Alkaline Phosphatase 25 - 125  60 (E)  Albumin 3.5 - 5.0  4.2 (E)  AST 13 - 35  29 (E)  ALT 7 - 35  18 (E)  Bilirubin, Total  0.7 (E)  WBC  7.8 (E)  RBC 3.87 - 5.11  4.8 (E)  Hemoglobin 12.0 - 16.0  14.9 (E)  HCT 36 - 46  44 (E)  Platelets 150 - 399  301 (E)  NEUT#  4.09 (E)   ASSESSMENT & PLAN:  Assessment/Plan:  A 55 y.o. female with stage IVA lung adenocarcinoma.  She will proceed with her 19th cycle of pemobrolizumab this week.  Per her request, we will continue spacing all immunotherapy treatments out to every 6 weeks.   Clinically, she appears to be doing very well.  I will see her back in 6 weeks before she heads into her 20th cycle of maintenance pembrolizumab immunotherapy.  The patient understands all the plans discussed today and is in agreement with them.     I, Rita Ohara, am acting as scribe for Marice Potter, MD    I have reviewed this report as typed by the medical scribe, and it is complete and accurate.  Fatin Bachicha Macarthur Critchley, MD

## 2020-12-08 ENCOUNTER — Encounter: Payer: Self-pay | Admitting: Oncology

## 2020-12-13 ENCOUNTER — Inpatient Hospital Stay: Payer: Federal, State, Local not specified - PPO | Attending: Oncology

## 2020-12-13 ENCOUNTER — Inpatient Hospital Stay (INDEPENDENT_AMBULATORY_CARE_PROVIDER_SITE_OTHER): Payer: Federal, State, Local not specified - PPO | Admitting: Oncology

## 2020-12-13 ENCOUNTER — Other Ambulatory Visit: Payer: Self-pay | Admitting: Oncology

## 2020-12-13 ENCOUNTER — Other Ambulatory Visit: Payer: Self-pay | Admitting: Hematology and Oncology

## 2020-12-13 ENCOUNTER — Telehealth: Payer: Self-pay | Admitting: Oncology

## 2020-12-13 ENCOUNTER — Other Ambulatory Visit: Payer: Self-pay

## 2020-12-13 VITALS — BP 123/68 | HR 66 | Temp 98.1°F | Resp 14 | Ht 65.0 in | Wt 104.6 lb

## 2020-12-13 DIAGNOSIS — Z5112 Encounter for antineoplastic immunotherapy: Secondary | ICD-10-CM | POA: Diagnosis not present

## 2020-12-13 DIAGNOSIS — C3481 Malignant neoplasm of overlapping sites of right bronchus and lung: Secondary | ICD-10-CM | POA: Insufficient documentation

## 2020-12-13 DIAGNOSIS — C3432 Malignant neoplasm of lower lobe, left bronchus or lung: Secondary | ICD-10-CM | POA: Insufficient documentation

## 2020-12-13 DIAGNOSIS — Z79899 Other long term (current) drug therapy: Secondary | ICD-10-CM | POA: Insufficient documentation

## 2020-12-13 LAB — CBC AND DIFFERENTIAL
HCT: 44 (ref 36–46)
Hemoglobin: 14.9 (ref 12.0–16.0)
Neutrophils Absolute: 4.09
Platelets: 301 (ref 150–399)
WBC: 7.8

## 2020-12-13 LAB — HEPATIC FUNCTION PANEL
ALT: 18 (ref 7–35)
AST: 29 (ref 13–35)
Alkaline Phosphatase: 60 (ref 25–125)
Bilirubin, Total: 0.7

## 2020-12-13 LAB — COMPREHENSIVE METABOLIC PANEL
Albumin: 4.2 (ref 3.5–5.0)
Calcium: 8.7 (ref 8.7–10.7)

## 2020-12-13 LAB — BASIC METABOLIC PANEL
BUN: 9 (ref 4–21)
CO2: 25 — AB (ref 13–22)
Chloride: 104 (ref 99–108)
Creatinine: 0.6 (ref 0.5–1.1)
Glucose: 82
Potassium: 4.3 (ref 3.4–5.3)
Sodium: 135 — AB (ref 137–147)

## 2020-12-13 LAB — TSH: TSH: 2.888 u[IU]/mL (ref 0.350–4.500)

## 2020-12-13 LAB — CBC: RBC: 4.8 (ref 3.87–5.11)

## 2020-12-13 NOTE — Telephone Encounter (Signed)
Per 11/21 los next appt scheduled and given to patient

## 2020-12-14 MED FILL — Pembrolizumab IV Soln 100 MG/4ML (25 MG/ML): INTRAVENOUS | Qty: 8 | Status: AC

## 2020-12-15 ENCOUNTER — Other Ambulatory Visit: Payer: Self-pay

## 2020-12-15 ENCOUNTER — Inpatient Hospital Stay: Payer: Federal, State, Local not specified - PPO

## 2020-12-15 VITALS — BP 111/64 | HR 78 | Temp 97.6°F | Resp 18 | Ht 65.0 in | Wt 106.0 lb

## 2020-12-15 DIAGNOSIS — C3481 Malignant neoplasm of overlapping sites of right bronchus and lung: Secondary | ICD-10-CM

## 2020-12-15 DIAGNOSIS — C3432 Malignant neoplasm of lower lobe, left bronchus or lung: Secondary | ICD-10-CM | POA: Diagnosis not present

## 2020-12-15 DIAGNOSIS — Z79899 Other long term (current) drug therapy: Secondary | ICD-10-CM | POA: Diagnosis not present

## 2020-12-15 DIAGNOSIS — Z5112 Encounter for antineoplastic immunotherapy: Secondary | ICD-10-CM | POA: Diagnosis not present

## 2020-12-15 MED ORDER — HEPARIN SOD (PORK) LOCK FLUSH 100 UNIT/ML IV SOLN
500.0000 [IU] | Freq: Once | INTRAVENOUS | Status: AC | PRN
  Administered 2020-12-15: 500 [IU]

## 2020-12-15 MED ORDER — SODIUM CHLORIDE 0.9% FLUSH
10.0000 mL | INTRAVENOUS | Status: DC | PRN
  Administered 2020-12-15: 10 mL

## 2020-12-15 MED ORDER — SODIUM CHLORIDE 0.9 % IV SOLN: Freq: Once | INTRAVENOUS | Status: AC

## 2020-12-15 MED ORDER — SODIUM CHLORIDE 0.9 % IV SOLN
200.0000 mg | Freq: Once | INTRAVENOUS | Status: AC
  Administered 2020-12-15: 200 mg via INTRAVENOUS
  Filled 2020-12-15: qty 8

## 2020-12-15 NOTE — Patient Instructions (Signed)
[facility]  Discharge Instructions: Thank you for choosing [facility] to provide your oncology and hematology care.  If you have a lab appointment with the [facility], please go directly to the [facility] and check in at the registration area.   Wear comfortable clothing and clothing appropriate for easy access to any Portacath or PICC line.   We strive to give you quality time with your provider. You may need to reschedule your appointment if you arrive late (15 or more minutes).  Arriving late affects you and other patients whose appointments are after yours.  Also, if you miss three or more appointments without notifying the office, you may be dismissed from the clinic at the provider's discretion.      For prescription refill requests, have your pharmacy contact our office and allow 72 hours for refills to be completed.    Today you received the following chemotherapy and/or immunotherapy agents [name]       To help prevent nausea and vomiting after your treatment, we encourage you to take your nausea medication as directed.  BELOW ARE SYMPTOMS THAT SHOULD BE REPORTED IMMEDIATELY: *FEVER GREATER THAN 100.4 F (38 C) OR HIGHER *CHILLS OR SWEATING *NAUSEA AND VOMITING THAT IS NOT CONTROLLED WITH YOUR NAUSEA MEDICATION *UNUSUAL SHORTNESS OF BREATH *UNUSUAL BRUISING OR BLEEDING *URINARY PROBLEMS (pain or burning when urinating, or frequent urination) *BOWEL PROBLEMS (unusual diarrhea, constipation, pain near the anus) TENDERNESS IN MOUTH AND THROAT WITH OR WITHOUT PRESENCE OF ULCERS (sore throat, sores in mouth, or a toothache) UNUSUAL RASH, SWELLING OR PAIN  UNUSUAL VAGINAL DISCHARGE OR ITCHING   Items with * indicate a potential emergency and should be followed up as soon as possible or go to the Emergency Department if any problems should occur.  Please show the CHEMOTHERAPY ALERT CARD or IMMUNOTHERAPY ALERT CARD at check-in to the  Emergency Department and triage nurse.  Should you have questions after your visit or need to cancel or reschedule your appointment, please contact [facility]  Dept: [phone]  and follow the prompts.  Office hours are 8:00 a.m. to 4:30 p.m. Monday - Friday. Please note that voicemails left after 4:00 p.m. may not be returned until the following business day.  We are closed weekends and major holidays. You have access to a nurse at all times for urgent questions. Please call the main number to the clinic Dept: [phone] and follow the prompts.  For any non-urgent questions, you may also contact your provider using MyChart. We now offer e-Visits for anyone 54 and older to request care online for non-urgent symptoms. For details visit [email or web address].   Also download the MyChart app! Go to the app store, search "MyChart", open the app, select [facility], and log in with your MyChart username and password.  Due to Covid, a mask is required upon entering the hospital/clinic. If you do not have a mask, one will be given to you upon arrival. For doctor visits, patients may have 1 support person aged 31 or older with them. For treatment visits, patients cannot have anyone with them due to current Covid guidelines and our immunocompromised population.   Pembrolizumab injection What is this medication? PEMBROLIZUMAB (pem broe liz ue mab) is a monoclonal antibody. It is used to treat certain types of cancer. This medicine may be used for other purposes; ask your health care provider or pharmacist if you have questions. COMMON BRAND NAME(S): Keytruda What should  I tell my care team before I take this medication? They need to know if you have any of these conditions: autoimmune diseases like Crohn's disease, ulcerative colitis, or lupus have had or planning to have an allogeneic stem cell transplant (uses someone else's stem cells) history of organ transplant history  of chest radiation nervous system problems like myasthenia gravis or Guillain-Barre syndrome an unusual or allergic reaction to pembrolizumab, other medicines, foods, dyes, or preservatives pregnant or trying to get pregnant breast-feeding How should I use this medication? This medicine is for infusion into a vein. It is given by a health care professional in a hospital or clinic setting. A special MedGuide will be given to you before each treatment. Be sure to read this information carefully each time. Talk to your pediatrician regarding the use of this medicine in children. While this drug may be prescribed for children as young as 6 months for selected conditions, precautions do apply. Overdosage: If you think you have taken too much of this medicine contact a poison control center or emergency room at once. NOTE: This medicine is only for you. Do not share this medicine with others. What if I miss a dose? It is important not to miss your dose. Call your doctor or health care professional if you are unable to keep an appointment. What may interact with this medication? Interactions have not been studied. This list may not describe all possible interactions. Give your health care provider a list of all the medicines, herbs, non-prescription drugs, or dietary supplements you use. Also tell them if you smoke, drink alcohol, or use illegal drugs. Some items may interact with your medicine. What should I watch for while using this medication? Your condition will be monitored carefully while you are receiving this medicine. You may need blood work done while you are taking this medicine. Do not become pregnant while taking this medicine or for 4 months after stopping it. Women should inform their doctor if they wish to become pregnant or think they might be pregnant. There is a potential for serious side effects to an unborn child. Talk to your health care professional or pharmacist for more  information. Do not breast-feed an infant while taking this medicine or for 4 months after the last dose. What side effects may I notice from receiving this medication? Side effects that you should report to your doctor or health care professional as soon as possible: allergic reactions like skin rash, itching or hives, swelling of the face, lips, or tongue bloody or black, tarry breathing problems changes in vision chest pain chills confusion constipation cough diarrhea dizziness or feeling faint or lightheaded fast or irregular heartbeat fever flushing joint pain low blood counts - this medicine may decrease the number of white blood cells, red blood cells and platelets. You may be at increased risk for infections and bleeding. muscle pain muscle weakness pain, tingling, numbness in the hands or feet persistent headache redness, blistering, peeling or loosening of the skin, including inside the mouth signs and symptoms of high blood sugar such as dizziness; dry mouth; dry skin; fruity breath; nausea; stomach pain; increased hunger or thirst; increased urination signs and symptoms of kidney injury like trouble passing urine or change in the amount of urine signs and symptoms of liver injury like dark urine, light-colored stools, loss of appetite, nausea, right upper belly pain, yellowing of the eyes or skin sweating swollen lymph nodes weight loss Side effects that usually do not require medical attention (  report to your doctor or health care professional if they continue or are bothersome): decreased appetite hair loss tiredness This list may not describe all possible side effects. Call your doctor for medical advice about side effects. You may report side effects to FDA at 1-800-FDA-1088. Where should I keep my medication? This drug is given in a hospital or clinic and will not be stored at home. NOTE: This sheet is a summary. It may not cover all possible information. If you  have questions about this medicine, talk to your doctor, pharmacist, or health care provider.  2022 Elsevier/Gold Standard (2020-09-28 00:00:00)

## 2020-12-26 DIAGNOSIS — J4540 Moderate persistent asthma, uncomplicated: Secondary | ICD-10-CM | POA: Diagnosis not present

## 2021-01-04 DIAGNOSIS — J4520 Mild intermittent asthma, uncomplicated: Secondary | ICD-10-CM | POA: Diagnosis not present

## 2021-01-10 ENCOUNTER — Other Ambulatory Visit: Payer: Self-pay

## 2021-01-14 NOTE — Progress Notes (Signed)
[facility]  [address] [place],  [place]  [address] [phone]  Clinic Day:  01/25/2021  Referring physician: Janine Limbo, PA-C  This document serves as a record of services personally performed by Marice Potter, MD. It was created on their behalf by Curry,Lauren E, a trained medical scribe. The creation of this record is based on the scribe's personal observations and the provider's statements to them.  HISTORY OF PRESENT ILLNESS:  The patient is a 55 y.o. female with stage IVA lung adenocarcinoma.  This staging is based upon initial scans showing bilateral lung involvement with her cancer.  She comes in today prior to her 20th cycle of pembrolizumab.  The patient receives   maintenance pembrolizumab every 6 weeks.  She continues to tolerate all of her cycles very well.  Her fatigue has been better since her immunotherapy has been spaced out to every 6 weeks.  From a lung cancer standpoint, she denies having any new respiratory symptoms which concern her for overt signs of disease progression.  Of note, she continues to work on a daily basis.  PHYSICAL EXAM:  Blood pressure (!) 106/59, pulse 80, temperature 98.2 F (36.8 C), resp. rate 16, height 5\' 5"  (1.651 m), weight 104 lb 14.4 oz (47.6 kg), SpO2 99 %. Wt Readings from Last 3 Encounters:  01/25/21 104 lb 14.4 oz (47.6 kg)  12/15/20 106 lb 0.6 oz (48.1 kg)  12/13/20 104 lb 9.6 oz (47.4 kg)   Body mass index is 17.46 kg/m. Performance status (ECOG): 1 - Symptomatic but completely ambulatory Physical Exam Constitutional:      Appearance: Normal appearance. She is not ill-appearing.  HENT:     Mouth/Throat:     Mouth: Mucous membranes are moist.     Pharynx: Oropharynx is clear. No oropharyngeal exudate or posterior oropharyngeal erythema.  Cardiovascular:     Rate and Rhythm: Normal rate and regular rhythm.     Heart sounds: No murmur heard.   No friction rub. No gallop.  Pulmonary:      Effort: Pulmonary effort is normal. No respiratory distress.     Breath sounds: Normal breath sounds. No wheezing, rhonchi or rales.  Abdominal:     General: Bowel sounds are normal. There is no distension.     Palpations: Abdomen is soft. There is no mass.     Tenderness: There is no abdominal tenderness.  Musculoskeletal:        General: No swelling.     Right lower leg: No edema.     Left lower leg: No edema.  Lymphadenopathy:     Cervical: No cervical adenopathy.     Upper Body:     Right upper body: No supraclavicular or axillary adenopathy.     Left upper body: No supraclavicular or axillary adenopathy.     Lower Body: No right inguinal adenopathy. No left inguinal adenopathy.  Skin:    General: Skin is warm.     Coloration: Skin is not jaundiced.     Findings: No lesion or rash.  Neurological:     General: No focal deficit present.     Mental Status: She is alert and oriented to person, place, and time. Mental status is at baseline.  Psychiatric:        Mood and Affect: Mood normal.        Behavior: Behavior normal.        Thought Content: Thought content normal.   LABS:   Latest Reference  Range & Units 01/25/21 00:00  Sodium 137 - 147  137 (E)  Potassium 3.4 - 5.3  3.8 (E)  Chloride 99 - 108  102 (E)  CO2 13 - 22  27 ! (E)  Glucose  107 (E)  BUN 4 - 21  7 (E)  Creatinine 0.5 - 1.1  0.5 (E)  Calcium 8.7 - 10.7  8.9 (E)  Alkaline Phosphatase 25 - 125  57 (E)  Albumin 3.5 - 5.0  4.4 (E)  AST 13 - 35  24 (E)  ALT 7 - 35  19 (E)  Bilirubin, Total  0.7 (E)  WBC  10.4 (E)  RBC 3.87 - 5.11  4.77 (E)  Hemoglobin 12.0 - 16.0  14.9 (E)  HCT 36 - 46  44 (E)  Platelets 150 - 399  330 (E)  NEUT#  8.22 (E)  !: Data is abnormal (E): External lab result  Latest Reference Range & Units 01/25/21 08:58  TSH 0.350 - 4.500 uIU/mL 2.373    ASSESSMENT & PLAN:  Assessment/Plan:  A 55 y.o. female with stage IVA lung adenocarcinoma.  She will proceed with her 20th cycle of  pemobrolizumab this week.  Per her request, she will continue receiving her  immunotherapy every 6 weeks.  Clinically, she appears to be doing very well.  I will see her back in 6 weeks before she heads into her 21st cycle of maintenance pembrolizumab immunotherapy.  A chest CT will be done a day before her next visit to ascertain her new disease baseline after 20 cycles of maintenance pembrolizumab immunotherapy.  The patient understands all the plans discussed today and is in agreement with them.     I, Rita Ohara, am acting as scribe for Marice Potter, MD    I have reviewed this report as typed by the medical scribe, and it is complete and accurate.  Wentworth Edelen Macarthur Critchley, MD

## 2021-01-19 ENCOUNTER — Other Ambulatory Visit: Payer: Self-pay

## 2021-01-19 ENCOUNTER — Telehealth: Payer: Self-pay | Admitting: Oncology

## 2021-01-19 DIAGNOSIS — C3481 Malignant neoplasm of overlapping sites of right bronchus and lung: Secondary | ICD-10-CM

## 2021-01-19 DIAGNOSIS — R0789 Other chest pain: Secondary | ICD-10-CM

## 2021-01-19 MED ORDER — HYDROCODONE-ACETAMINOPHEN 7.5-325 MG PO TABS: 1.0000 | ORAL_TABLET | Freq: Four times a day (QID) | ORAL | 0 refills | Status: DC | PRN

## 2021-01-19 NOTE — Telephone Encounter (Signed)
I contacted pt to verify health insurance for 2023; LVM.

## 2021-01-25 ENCOUNTER — Inpatient Hospital Stay: Payer: Federal, State, Local not specified - PPO | Attending: Oncology | Admitting: Oncology

## 2021-01-25 ENCOUNTER — Inpatient Hospital Stay: Payer: Federal, State, Local not specified - PPO

## 2021-01-25 ENCOUNTER — Other Ambulatory Visit: Payer: Self-pay | Admitting: Oncology

## 2021-01-25 ENCOUNTER — Other Ambulatory Visit: Payer: Self-pay | Admitting: Hematology and Oncology

## 2021-01-25 VITALS — BP 106/59 | HR 80 | Temp 98.2°F | Resp 16 | Ht 65.0 in | Wt 104.9 lb

## 2021-01-25 DIAGNOSIS — C3481 Malignant neoplasm of overlapping sites of right bronchus and lung: Secondary | ICD-10-CM

## 2021-01-25 DIAGNOSIS — C3432 Malignant neoplasm of lower lobe, left bronchus or lung: Secondary | ICD-10-CM | POA: Diagnosis not present

## 2021-01-25 DIAGNOSIS — Z5112 Encounter for antineoplastic immunotherapy: Secondary | ICD-10-CM | POA: Insufficient documentation

## 2021-01-25 DIAGNOSIS — Z79899 Other long term (current) drug therapy: Secondary | ICD-10-CM | POA: Insufficient documentation

## 2021-01-25 LAB — HEPATIC FUNCTION PANEL
ALT: 19 (ref 7–35)
AST: 24 (ref 13–35)
Alkaline Phosphatase: 57 (ref 25–125)
Bilirubin, Total: 0.7

## 2021-01-25 LAB — BASIC METABOLIC PANEL
BUN: 7 (ref 4–21)
CO2: 27 — AB (ref 13–22)
Chloride: 102 (ref 99–108)
Creatinine: 0.5 (ref 0.5–1.1)
Glucose: 107
Potassium: 3.8 (ref 3.4–5.3)
Sodium: 137 (ref 137–147)

## 2021-01-25 LAB — COMPREHENSIVE METABOLIC PANEL
Albumin: 4.4 (ref 3.5–5.0)
Calcium: 8.9 (ref 8.7–10.7)

## 2021-01-25 LAB — CBC AND DIFFERENTIAL
HCT: 44 (ref 36–46)
Hemoglobin: 14.9 (ref 12.0–16.0)
Neutrophils Absolute: 8.22
Platelets: 330 (ref 150–399)
WBC: 10.4

## 2021-01-25 LAB — CBC: RBC: 4.77 (ref 3.87–5.11)

## 2021-01-25 LAB — TSH: TSH: 2.373 u[IU]/mL (ref 0.350–4.500)

## 2021-01-25 NOTE — Progress Notes (Signed)
Had patient sign re-enrollment paperwork for co-pay assistance for Keytruda. Will fax in once Doctor Bobby Rumpf has signed his portion.

## 2021-01-26 ENCOUNTER — Other Ambulatory Visit: Payer: Self-pay

## 2021-01-26 ENCOUNTER — Inpatient Hospital Stay: Payer: Federal, State, Local not specified - PPO

## 2021-01-26 VITALS — BP 136/69 | HR 108 | Temp 97.9°F | Resp 16 | Ht 65.0 in | Wt 104.0 lb

## 2021-01-26 DIAGNOSIS — C3432 Malignant neoplasm of lower lobe, left bronchus or lung: Secondary | ICD-10-CM | POA: Diagnosis not present

## 2021-01-26 DIAGNOSIS — C3481 Malignant neoplasm of overlapping sites of right bronchus and lung: Secondary | ICD-10-CM

## 2021-01-26 DIAGNOSIS — J4540 Moderate persistent asthma, uncomplicated: Secondary | ICD-10-CM | POA: Diagnosis not present

## 2021-01-26 DIAGNOSIS — Z79899 Other long term (current) drug therapy: Secondary | ICD-10-CM | POA: Diagnosis not present

## 2021-01-26 DIAGNOSIS — Z5112 Encounter for antineoplastic immunotherapy: Secondary | ICD-10-CM | POA: Diagnosis not present

## 2021-01-26 MED ORDER — HEPARIN SOD (PORK) LOCK FLUSH 100 UNIT/ML IV SOLN
500.0000 [IU] | Freq: Once | INTRAVENOUS | Status: AC | PRN
  Administered 2021-01-26: 500 [IU]

## 2021-01-26 MED ORDER — SODIUM CHLORIDE 0.9 % IV SOLN
200.0000 mg | Freq: Once | INTRAVENOUS | Status: AC
  Administered 2021-01-26: 200 mg via INTRAVENOUS
  Filled 2021-01-26: qty 8

## 2021-01-26 MED ORDER — SODIUM CHLORIDE 0.9 % IV SOLN: Freq: Once | INTRAVENOUS | Status: AC

## 2021-01-26 MED ORDER — SODIUM CHLORIDE 0.9% FLUSH
10.0000 mL | INTRAVENOUS | Status: DC | PRN
  Administered 2021-01-26: 10 mL

## 2021-01-26 NOTE — Patient Instructions (Signed)
Pembrolizumab injection What is this medication? PEMBROLIZUMAB (pem broe liz ue mab) is a monoclonal antibody. It is used to treat certain types of cancer. This medicine may be used for other purposes; ask your health care provider or pharmacist if you have questions. COMMON BRAND NAME(S): Keytruda What should I tell my care team before I take this medication? They need to know if you have any of these conditions: autoimmune diseases like Crohn's disease, ulcerative colitis, or lupus have had or planning to have an allogeneic stem cell transplant (uses someone else's stem cells) history of organ transplant history of chest radiation nervous system problems like myasthenia gravis or Guillain-Barre syndrome an unusual or allergic reaction to pembrolizumab, other medicines, foods, dyes, or preservatives pregnant or trying to get pregnant breast-feeding How should I use this medication? This medicine is for infusion into a vein. It is given by a health care professional in a hospital or clinic setting. A special MedGuide will be given to you before each treatment. Be sure to read this information carefully each time. Talk to your pediatrician regarding the use of this medicine in children. While this drug may be prescribed for children as young as 6 months for selected conditions, precautions do apply. Overdosage: If you think you have taken too much of this medicine contact a poison control center or emergency room at once. NOTE: This medicine is only for you. Do not share this medicine with others. What if I miss a dose? It is important not to miss your dose. Call your doctor or health care professional if you are unable to keep an appointment. What may interact with this medication? Interactions have not been studied. This list may not describe all possible interactions. Give your health care provider a list of all the medicines, herbs, non-prescription drugs, or dietary supplements you use.  Also tell them if you smoke, drink alcohol, or use illegal drugs. Some items may interact with your medicine. What should I watch for while using this medication? Your condition will be monitored carefully while you are receiving this medicine. You may need blood work done while you are taking this medicine. Do not become pregnant while taking this medicine or for 4 months after stopping it. Women should inform their doctor if they wish to become pregnant or think they might be pregnant. There is a potential for serious side effects to an unborn child. Talk to your health care professional or pharmacist for more information. Do not breast-feed an infant while taking this medicine or for 4 months after the last dose. What side effects may I notice from receiving this medication? Side effects that you should report to your doctor or health care professional as soon as possible: allergic reactions like skin rash, itching or hives, swelling of the face, lips, or tongue bloody or black, tarry breathing problems changes in vision chest pain chills confusion constipation cough diarrhea dizziness or feeling faint or lightheaded fast or irregular heartbeat fever flushing joint pain low blood counts - this medicine may decrease the number of white blood cells, red blood cells and platelets. You may be at increased risk for infections and bleeding. muscle pain muscle weakness pain, tingling, numbness in the hands or feet persistent headache redness, blistering, peeling or loosening of the skin, including inside the mouth signs and symptoms of high blood sugar such as dizziness; dry mouth; dry skin; fruity breath; nausea; stomach pain; increased hunger or thirst; increased urination signs and symptoms of kidney injury like trouble  passing urine or change in the amount of urine signs and symptoms of liver injury like dark urine, light-colored stools, loss of appetite, nausea, right upper belly pain,  yellowing of the eyes or skin sweating swollen lymph nodes weight loss Side effects that usually do not require medical attention (report to your doctor or health care professional if they continue or are bothersome): decreased appetite hair loss tiredness This list may not describe all possible side effects. Call your doctor for medical advice about side effects. You may report side effects to FDA at 1-800-FDA-1088. Where should I keep my medication? This drug is given in a hospital or clinic and will not be stored at home. NOTE: This sheet is a summary. It may not cover all possible information. If you have questions about this medicine, talk to your doctor, pharmacist, or health care provider.  2022 Elsevier/Gold Standard (2020-09-28 00:00:00)

## 2021-01-28 ENCOUNTER — Encounter: Payer: Self-pay | Admitting: Oncology

## 2021-01-31 DIAGNOSIS — Z79899 Other long term (current) drug therapy: Secondary | ICD-10-CM | POA: Diagnosis not present

## 2021-01-31 DIAGNOSIS — E785 Hyperlipidemia, unspecified: Secondary | ICD-10-CM | POA: Diagnosis not present

## 2021-01-31 DIAGNOSIS — C3490 Malignant neoplasm of unspecified part of unspecified bronchus or lung: Secondary | ICD-10-CM | POA: Diagnosis not present

## 2021-01-31 DIAGNOSIS — J449 Chronic obstructive pulmonary disease, unspecified: Secondary | ICD-10-CM | POA: Diagnosis not present

## 2021-01-31 DIAGNOSIS — I10 Essential (primary) hypertension: Secondary | ICD-10-CM | POA: Diagnosis not present

## 2021-02-04 DIAGNOSIS — J4520 Mild intermittent asthma, uncomplicated: Secondary | ICD-10-CM | POA: Diagnosis not present

## 2021-02-09 NOTE — Progress Notes (Signed)
Patient was approved for co-pay assistance through [employer].  MK1 - [id number] 01/23/2021 - 01/22/2022 Submit claims to [phone]

## 2021-02-26 DIAGNOSIS — J4540 Moderate persistent asthma, uncomplicated: Secondary | ICD-10-CM | POA: Diagnosis not present

## 2021-03-02 ENCOUNTER — Other Ambulatory Visit: Payer: Self-pay | Admitting: Oncology

## 2021-03-02 DIAGNOSIS — C3481 Malignant neoplasm of overlapping sites of right bronchus and lung: Secondary | ICD-10-CM

## 2021-03-03 ENCOUNTER — Telehealth: Payer: Self-pay | Admitting: Oncology

## 2021-03-03 NOTE — Progress Notes (Signed)
[facility]  [address] [place],  [place]  [address] [phone]  Clinic Day:  03/08/2021  Referring physician: Janine Limbo, PA-C  This document serves as a record of services personally performed by Marice Potter, MD. It was created on their behalf by Curry,Lauren E, a trained medical scribe. The creation of this record is based on the scribe's personal observations and the provider's statements to them.  HISTORY OF PRESENT ILLNESS:  The patient is a 56 y.o. female with stage IVA lung adenocarcinoma.  This staging is based upon initial scans showing bilateral lung involvement with her cancer.  She comes in today to ascertain her new disease baseline after 20 cycles of maintenance pembrolizumab immunotherapy.  The patient receives maintenance pembrolizumab every 6 weeks.  She continues to tolerate all of her cycles very well.  Her fatigue has been better since her immunotherapy has been spaced out to every 6 weeks.  From a lung cancer standpoint, she denies having any new respiratory symptoms which concern her for overt signs of disease progression.  Of note, she continues to work on a daily basis.  PHYSICAL EXAM:  Blood pressure 113/67, pulse 89, temperature 98.3 F (36.8 C), temperature source Tympanic, resp. rate 16, weight 105 lb (47.6 kg), SpO2 94 %. Wt Readings from Last 3 Encounters:  03/08/21 105 lb (47.6 kg)  01/26/21 104 lb (47.2 kg)  01/25/21 104 lb 14.4 oz (47.6 kg)   Body mass index is 17.47 kg/m. Performance status (ECOG): 1 - Symptomatic but completely ambulatory Physical Exam Constitutional:      Appearance: Normal appearance. She is not ill-appearing.  HENT:     Mouth/Throat:     Mouth: Mucous membranes are moist.     Pharynx: Oropharynx is clear. No oropharyngeal exudate or posterior oropharyngeal erythema.  Cardiovascular:     Rate and Rhythm: Normal rate and regular rhythm.     Heart sounds: No murmur heard.   No  friction rub. No gallop.  Pulmonary:     Effort: Pulmonary effort is normal. No respiratory distress.     Breath sounds: Normal breath sounds. No wheezing, rhonchi or rales.  Abdominal:     General: Bowel sounds are normal. There is no distension.     Palpations: Abdomen is soft. There is no mass.     Tenderness: There is no abdominal tenderness.  Musculoskeletal:        General: No swelling.     Right lower leg: No edema.     Left lower leg: No edema.  Lymphadenopathy:     Cervical: No cervical adenopathy.     Upper Body:     Right upper body: No supraclavicular or axillary adenopathy.     Left upper body: No supraclavicular or axillary adenopathy.     Lower Body: No right inguinal adenopathy. No left inguinal adenopathy.  Skin:    General: Skin is warm.     Coloration: Skin is not jaundiced.     Findings: No lesion or rash.  Neurological:     General: No focal deficit present.     Mental Status: She is alert and oriented to person, place, and time. Mental status is at baseline.  Psychiatric:        Mood and Affect: Mood normal.        Behavior: Behavior normal.        Thought Content: Thought content normal.   SCANS: Recent CT imaging has revealed the following:  FINDINGS:  Cardiovascular: Heart size is normal. There is no significant  pericardial fluid, thickening or pericardial calcification. There is  aortic atherosclerosis, as well as atherosclerosis of the great  vessels of the mediastinum and the coronary arteries, including  calcified atherosclerotic plaque in the left main, left anterior  descending, left circumflex and right coronary arteries. Separate  origin of the left vertebral artery directly off the aortic arch  (normal anatomical variant) incidentally noted. 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 Right subclavian  single-lumen porta cath with tip terminating in the distal superior  vena cava.  Mediastinum/Nodes: No pathologically  enlarged mediastinal or hilar  lymph nodes. Esophagus is unremarkable in appearance. No axillary  lymphadenopathy.  Lungs/Pleura: Multiple tiny pulmonary nodules are again noted in the  lungs bilaterally (right greater than left), stable in number and  size compared to the prior study, largest of which measures only 5  mm (axial image 98 of series 301). No other larger more suspicious  appearing pulmonary nodules or masses are noted. No acute  consolidative airspace disease. No pleural effusions. Mild diffuse  bronchial wall thickening with mild to moderate centrilobular and  paraseptal emphysema.  Upper Abdomen: Aortic atherosclerosis.  Musculoskeletal: Orthopedic fixation hardware in the lower cervical  spine incidentally noted. There are no aggressive appearing lytic or  blastic lesions noted in the visualized portions of the skeleton.   IMPRESSION:  1. Stable appearance of the lungs with multiple small pulmonary  nodules measuring 5 mm or less in size. Given the stability, these  are presumably benign. No new pulmonary nodules or masses and no  lymphadenopathy.  2. Diffuse bronchial wall thickening with mild to moderate  centrilobular and paraseptal emphysema; imaging findings suggestive  of underlying COPD.  3. Aortic atherosclerosis, in addition to left main and three-vessel  coronary artery disease. Please note that although the presence of  coronary artery calcium documents the presence of coronary artery  disease, the severity of this disease and any potential stenosis  cannot be assessed on this non-gated CT examination. Assessment for  potential risk factor modification, dietary therapy or pharmacologic  therapy may be warranted, if clinically indicated.  4. Severe stenosis at the ostium of the left subclavian artery.  Notably, there is separate origin of the left vertebral artery  directly off the aortic arch in this patient.   LABS:   Latest Reference Range & Units  03/08/21 00:00  Sodium 137 - 147  136 !  Potassium 3.4 - 5.3  4.0  Chloride 99 - 108  103  CO2 13 - 22  25 !  Glucose  87  BUN 4 - 21  10  Creatinine 0.5 - 1.1  0.5  Calcium 8.7 - 10.7  9.0  Alkaline Phosphatase 25 - 125  58  Albumin 3.5 - 5.0  4.4  AST 13 - 35  27  ALT 7 - 35  21  Bilirubin, Total  0.5  WBC  8.1  RBC 3.87 - 5.11  4.87  Hemoglobin 12.0 - 16.0  15.3  HCT 36 - 46  45  Platelets 150 - 399  293  NEUT#  5.83     Latest Reference Range & Units 01/25/21 08:58  TSH 0.350 - 4.500 uIU/mL 2.373    ASSESSMENT & PLAN:  Assessment/Plan:  A 56 y.o. female with stage IVA lung adenocarcinoma. In clinic today, I reviewed her CT images with her, for which she could see her disease remains under ideal control.  She will proceed with her 21st cycle of pemobrolizumab this week.  She will continue receiving her immunotherapy every 6 weeks.  Clinically, she appears to be doing very well.  I will see her back in 6 weeks before she heads into her 22nd cycle of maintenance pembrolizumab immunotherapy. The patient understands all the plans discussed today and is in agreement with them.     I, Rita Ohara, am acting as scribe for Marice Potter, MD    I have reviewed this report as typed by the medical scribe, and it is complete and accurate.  Dequincy Macarthur Critchley, MD

## 2021-03-07 DIAGNOSIS — C3481 Malignant neoplasm of overlapping sites of right bronchus and lung: Secondary | ICD-10-CM | POA: Diagnosis not present

## 2021-03-07 DIAGNOSIS — J4520 Mild intermittent asthma, uncomplicated: Secondary | ICD-10-CM | POA: Diagnosis not present

## 2021-03-07 DIAGNOSIS — J439 Emphysema, unspecified: Secondary | ICD-10-CM | POA: Diagnosis not present

## 2021-03-07 DIAGNOSIS — Z85110 Personal history of malignant carcinoid tumor of bronchus and lung: Secondary | ICD-10-CM | POA: Diagnosis not present

## 2021-03-07 DIAGNOSIS — R911 Solitary pulmonary nodule: Secondary | ICD-10-CM | POA: Diagnosis not present

## 2021-03-07 DIAGNOSIS — R918 Other nonspecific abnormal finding of lung field: Secondary | ICD-10-CM | POA: Diagnosis not present

## 2021-03-08 ENCOUNTER — Other Ambulatory Visit: Payer: Self-pay

## 2021-03-08 ENCOUNTER — Encounter: Payer: Self-pay | Admitting: Oncology

## 2021-03-08 ENCOUNTER — Inpatient Hospital Stay: Payer: Federal, State, Local not specified - PPO | Attending: Oncology | Admitting: Oncology

## 2021-03-08 ENCOUNTER — Inpatient Hospital Stay: Payer: Federal, State, Local not specified - PPO

## 2021-03-08 VITALS — BP 113/67 | HR 89 | Temp 98.3°F | Resp 16 | Wt 105.0 lb

## 2021-03-08 DIAGNOSIS — Z79899 Other long term (current) drug therapy: Secondary | ICD-10-CM | POA: Insufficient documentation

## 2021-03-08 DIAGNOSIS — C3481 Malignant neoplasm of overlapping sites of right bronchus and lung: Secondary | ICD-10-CM | POA: Diagnosis not present

## 2021-03-08 DIAGNOSIS — C3432 Malignant neoplasm of lower lobe, left bronchus or lung: Secondary | ICD-10-CM | POA: Diagnosis not present

## 2021-03-08 DIAGNOSIS — Z5112 Encounter for antineoplastic immunotherapy: Secondary | ICD-10-CM | POA: Insufficient documentation

## 2021-03-08 LAB — T4, FREE: Free T4: 0.97 ng/dL (ref 0.61–1.12)

## 2021-03-08 LAB — CBC AND DIFFERENTIAL
HCT: 45 (ref 36–46)
Hemoglobin: 15.3 (ref 12.0–16.0)
Neutrophils Absolute: 5.83
Platelets: 293 (ref 150–399)
WBC: 8.1

## 2021-03-08 LAB — TSH: TSH: 1.642 u[IU]/mL (ref 0.350–4.500)

## 2021-03-08 LAB — BASIC METABOLIC PANEL
BUN: 10 (ref 4–21)
CO2: 25 — AB (ref 13–22)
Chloride: 103 (ref 99–108)
Creatinine: 0.5 (ref 0.5–1.1)
Glucose: 87
Potassium: 4 (ref 3.4–5.3)
Sodium: 136 — AB (ref 137–147)

## 2021-03-08 LAB — HEPATIC FUNCTION PANEL
ALT: 21 (ref 7–35)
AST: 27 (ref 13–35)
Alkaline Phosphatase: 58 (ref 25–125)
Bilirubin, Total: 0.5

## 2021-03-08 LAB — COMPREHENSIVE METABOLIC PANEL
Albumin: 4.4 (ref 3.5–5.0)
Calcium: 9 (ref 8.7–10.7)

## 2021-03-08 LAB — CBC: RBC: 4.87 (ref 3.87–5.11)

## 2021-03-08 MED FILL — Pembrolizumab IV Soln 100 MG/4ML (25 MG/ML): INTRAVENOUS | Qty: 8 | Status: AC

## 2021-03-08 NOTE — Progress Notes (Signed)
Pt in for follow up, denies any concerns today. 

## 2021-03-09 ENCOUNTER — Inpatient Hospital Stay (INDEPENDENT_AMBULATORY_CARE_PROVIDER_SITE_OTHER): Payer: Federal, State, Local not specified - PPO

## 2021-03-09 ENCOUNTER — Other Ambulatory Visit: Payer: Self-pay

## 2021-03-09 VITALS — BP 124/72 | HR 78 | Temp 98.0°F | Resp 18 | Ht 65.0 in | Wt 106.0 lb

## 2021-03-09 DIAGNOSIS — R0789 Other chest pain: Secondary | ICD-10-CM

## 2021-03-09 DIAGNOSIS — Z5112 Encounter for antineoplastic immunotherapy: Secondary | ICD-10-CM | POA: Diagnosis not present

## 2021-03-09 DIAGNOSIS — Z79899 Other long term (current) drug therapy: Secondary | ICD-10-CM | POA: Diagnosis not present

## 2021-03-09 DIAGNOSIS — C3432 Malignant neoplasm of lower lobe, left bronchus or lung: Secondary | ICD-10-CM | POA: Diagnosis not present

## 2021-03-09 DIAGNOSIS — C3481 Malignant neoplasm of overlapping sites of right bronchus and lung: Secondary | ICD-10-CM

## 2021-03-09 MED ORDER — SODIUM CHLORIDE 0.9% FLUSH
10.0000 mL | INTRAVENOUS | Status: DC | PRN
  Administered 2021-03-09: 10 mL

## 2021-03-09 MED ORDER — HEPARIN SOD (PORK) LOCK FLUSH 100 UNIT/ML IV SOLN
500.0000 [IU] | Freq: Once | INTRAVENOUS | Status: AC | PRN
  Administered 2021-03-09: 500 [IU]

## 2021-03-09 MED ORDER — SODIUM CHLORIDE 0.9 % IV SOLN: Freq: Once | INTRAVENOUS | Status: AC

## 2021-03-09 MED ORDER — HYDROCODONE-ACETAMINOPHEN 7.5-325 MG PO TABS: 1.0000 | ORAL_TABLET | Freq: Four times a day (QID) | ORAL | 0 refills | Status: DC | PRN

## 2021-03-09 MED ORDER — SODIUM CHLORIDE 0.9 % IV SOLN
200.0000 mg | Freq: Once | INTRAVENOUS | Status: AC
  Administered 2021-03-09: 200 mg via INTRAVENOUS
  Filled 2021-03-09: qty 8

## 2021-03-09 NOTE — Patient Instructions (Signed)
[facility]  Discharge Instructions: Thank you for choosing [facility] to provide your oncology and hematology care.  If you have a lab appointment with the [facility], please go directly to the [facility] and check in at the registration area.   Wear comfortable clothing and clothing appropriate for easy access to any Portacath or PICC line.   We strive to give you quality time with your provider. You may need to reschedule your appointment if you arrive late (15 or more minutes).  Arriving late affects you and other patients whose appointments are after yours.  Also, if you miss three or more appointments without notifying the office, you may be dismissed from the clinic at the providers discretion.      For prescription refill requests, have your pharmacy contact our office and allow 72 hours for refills to be completed.    Today you received the following chemotherapy and/or immunotherapy agents [facility]   To help prevent nausea and vomiting after your treatment, we encourage you to take your nausea medication as directed.  BELOW ARE SYMPTOMS THAT SHOULD BE REPORTED IMMEDIATELY: *FEVER GREATER THAN 100.4 F (38 C) OR HIGHER *CHILLS OR SWEATING *NAUSEA AND VOMITING THAT IS NOT CONTROLLED WITH YOUR NAUSEA MEDICATION *UNUSUAL SHORTNESS OF BREATH *UNUSUAL BRUISING OR BLEEDING *URINARY PROBLEMS (pain or burning when urinating, or frequent urination) *BOWEL PROBLEMS (unusual diarrhea, constipation, pain near the anus) TENDERNESS IN MOUTH AND THROAT WITH OR WITHOUT PRESENCE OF ULCERS (sore throat, sores in mouth, or a toothache) UNUSUAL RASH, SWELLING OR PAIN  UNUSUAL VAGINAL DISCHARGE OR ITCHING   Items with * indicate a potential emergency and should be followed up as soon as possible or go to the Emergency Department if any problems should occur.  Please show the CHEMOTHERAPY ALERT CARD or IMMUNOTHERAPY ALERT CARD at check-in to the  Emergency Department and triage nurse.  Should you have questions after your visit or need to cancel or reschedule your appointment, please contact [facility]  Dept: [phone]  and follow the prompts.  Office hours are 8:00 a.m. to 4:30 p.m. Monday - Friday. Please note that voicemails left after 4:00 p.m. may not be returned until the following business day.  We are closed weekends and major holidays. You have access to a nurse at all times for urgent questions. Please call the main number to the clinic Dept: [phone] and follow the prompts.  For any non-urgent questions, you may also contact your provider using MyChart. We now offer e-Visits for anyone 72 and older to request care online for non-urgent symptoms. For details visit [email or web address].   Also download the MyChart app! Go to the app store, search "MyChart", open the app, select [facility], and log in with your MyChart username and password.  Due to Covid, a mask is required upon entering the hospital/clinic. If you do not have a mask, one will be given to you upon arrival. For doctor visits, patients may have 1 support person aged 66 or older with them. For treatment visits, patients cannot have anyone with them due to current Covid guidelines and our immunocompromised population.   Pembrolizumab injection What is this medication? PEMBROLIZUMAB (pem broe liz ue mab) is a monoclonal antibody. It is used to treat certain types of cancer. This medicine may be used for other purposes; ask your health care provider or pharmacist if you have questions. COMMON BRAND NAME(S): Keytruda What should I tell my care  team before I take this medication? They need to know if you have any of these conditions: autoimmune diseases like Crohn's disease, ulcerative colitis, or lupus have had or planning to have an allogeneic stem cell transplant (uses someone else's stem cells) history of organ transplant history  of chest radiation nervous system problems like myasthenia gravis or Guillain-Barre syndrome an unusual or allergic reaction to pembrolizumab, other medicines, foods, dyes, or preservatives pregnant or trying to get pregnant breast-feeding How should I use this medication? This medicine is for infusion into a vein. It is given by a health care professional in a hospital or clinic setting. A special MedGuide will be given to you before each treatment. Be sure to read this information carefully each time. Talk to your pediatrician regarding the use of this medicine in children. While this drug may be prescribed for children as young as 6 months for selected conditions, precautions do apply. Overdosage: If you think you have taken too much of this medicine contact a poison control center or emergency room at once. NOTE: This medicine is only for you. Do not share this medicine with others. What if I miss a dose? It is important not to miss your dose. Call your doctor or health care professional if you are unable to keep an appointment. What may interact with this medication? Interactions have not been studied. This list may not describe all possible interactions. Give your health care provider a list of all the medicines, herbs, non-prescription drugs, or dietary supplements you use. Also tell them if you smoke, drink alcohol, or use illegal drugs. Some items may interact with your medicine. What should I watch for while using this medication? Your condition will be monitored carefully while you are receiving this medicine. You may need blood work done while you are taking this medicine. Do not become pregnant while taking this medicine or for 4 months after stopping it. Women should inform their doctor if they wish to become pregnant or think they might be pregnant. There is a potential for serious side effects to an unborn child. Talk to your health care professional or pharmacist for more  information. Do not breast-feed an infant while taking this medicine or for 4 months after the last dose. What side effects may I notice from receiving this medication? Side effects that you should report to your doctor or health care professional as soon as possible: allergic reactions like skin rash, itching or hives, swelling of the face, lips, or tongue bloody or black, tarry breathing problems changes in vision chest pain chills confusion constipation cough diarrhea dizziness or feeling faint or lightheaded fast or irregular heartbeat fever flushing joint pain low blood counts - this medicine may decrease the number of white blood cells, red blood cells and platelets. You may be at increased risk for infections and bleeding. muscle pain muscle weakness pain, tingling, numbness in the hands or feet persistent headache redness, blistering, peeling or loosening of the skin, including inside the mouth signs and symptoms of high blood sugar such as dizziness; dry mouth; dry skin; fruity breath; nausea; stomach pain; increased hunger or thirst; increased urination signs and symptoms of kidney injury like trouble passing urine or change in the amount of urine signs and symptoms of liver injury like dark urine, light-colored stools, loss of appetite, nausea, right upper belly pain, yellowing of the eyes or skin sweating swollen lymph nodes weight loss Side effects that usually do not require medical attention (report to your doctor  or health care professional if they continue or are bothersome): decreased appetite hair loss tiredness This list may not describe all possible side effects. Call your doctor for medical advice about side effects. You may report side effects to FDA at 1-800-FDA-1088. Where should I keep my medication? This drug is given in a hospital or clinic and will not be stored at home. NOTE: This sheet is a summary. It may not cover all possible information. If you  have questions about this medicine, talk to your doctor, pharmacist, or health care provider.  2022 Elsevier/Gold Standard (2020-09-28 00:00:00)

## 2021-03-26 DIAGNOSIS — J4540 Moderate persistent asthma, uncomplicated: Secondary | ICD-10-CM | POA: Diagnosis not present

## 2021-04-04 DIAGNOSIS — J4520 Mild intermittent asthma, uncomplicated: Secondary | ICD-10-CM | POA: Diagnosis not present

## 2021-04-17 NOTE — Progress Notes (Signed)
?[facility]  ?[address] ?[place],  [place]  [address] ?[phone] ? ?Clinic Day:  04/18/2021 ? ?Referring physician: Janine Limbo, PA-C ? ? ?HISTORY OF PRESENT ILLNESS:  ?The patient is a 56 y.o. female with stage IVA lung adenocarcinoma.  This staging is based upon initial scans showing bilateral lung involvement with her cancer.  She comes in today to be evaluated before heading into her 22nd cycle of maintenance pembrolizumab immunotherapy.  The patient receives maintenance pembrolizumab every 6 weeks.  She continues to tolerate all of her cycles very well.  From a lung cancer standpoint, she denies having any new respiratory symptoms which concern her for overt signs of disease progression.  Of note, she continues to work on a daily basis. ? ?PHYSICAL EXAM:  ?Blood pressure 126/72, pulse 79, temperature 98 ?F (36.7 ?C), resp. rate 14, height 5\' 5"  (1.651 m), weight 106 lb 11.2 oz (48.4 kg), SpO2 91 %. ?Wt Readings from Last 3 Encounters:  ?04/18/21 106 lb 11.2 oz (48.4 kg)  ?03/09/21 106 lb (48.1 kg)  ?03/08/21 105 lb (47.6 kg)  ? ?Body mass index is 17.76 kg/m?Marland Kitchen ?Performance status (ECOG): 1 - Symptomatic but completely ambulatory ?Physical Exam ?Constitutional:   ?   Appearance: Normal appearance. She is not ill-appearing.  ?HENT:  ?   Mouth/Throat:  ?   Mouth: Mucous membranes are moist.  ?   Pharynx: Oropharynx is clear. No oropharyngeal exudate or posterior oropharyngeal erythema.  ?Cardiovascular:  ?   Rate and Rhythm: Normal rate and regular rhythm.  ?   Heart sounds: No murmur heard. ?  No friction rub. No gallop.  ?Pulmonary:  ?   Effort: Pulmonary effort is normal. No respiratory distress.  ?   Breath sounds: Normal breath sounds. No wheezing, rhonchi or rales.  ?Abdominal:  ?   General: Bowel sounds are normal. There is no distension.  ?   Palpations: Abdomen is soft. There is no mass.  ?   Tenderness: There is no abdominal tenderness.  ?Musculoskeletal:     ?    General: No swelling.  ?   Right lower leg: No edema.  ?   Left lower leg: No edema.  ?Lymphadenopathy:  ?   Cervical: No cervical adenopathy.  ?   Upper Body:  ?   Right upper body: No supraclavicular or axillary adenopathy.  ?   Left upper body: No supraclavicular or axillary adenopathy.  ?   Lower Body: No right inguinal adenopathy. No left inguinal adenopathy.  ?Skin: ?   General: Skin is warm.  ?   Coloration: Skin is not jaundiced.  ?   Findings: No lesion or rash.  ?Neurological:  ?   General: No focal deficit present.  ?   Mental Status: She is alert and oriented to person, place, and time. Mental status is at baseline.  ?Psychiatric:     ?   Mood and Affect: Mood normal.     ?   Behavior: Behavior normal.     ?   Thought Content: Thought content normal.  ? ?LABS:  ? ? ? ?ASSESSMENT & PLAN:  ?Assessment/Plan:  A 56 y.o. female with stage IVA lung adenocarcinoma.  She will proceed with her 22nd cycle of pemobrolizumab this week.  She will continue receiving her immunotherapy every 6 weeks.  Clinically, she appears to be doing very well.  I will see her back in 6 weeks before she heads into her 23rd cycle of maintenance  pembrolizumab immunotherapy. The patient understands all the plans discussed today and is in agreement with them.   ? ?Toriann Spadoni Macarthur Critchley, MD ? ? ? ?  ?

## 2021-04-18 ENCOUNTER — Other Ambulatory Visit: Payer: Self-pay

## 2021-04-18 ENCOUNTER — Inpatient Hospital Stay: Payer: Federal, State, Local not specified - PPO | Admitting: Oncology

## 2021-04-18 ENCOUNTER — Inpatient Hospital Stay: Payer: Federal, State, Local not specified - PPO | Attending: Oncology

## 2021-04-18 VITALS — BP 126/72 | HR 79 | Temp 98.0°F | Resp 14 | Ht 65.0 in | Wt 106.7 lb

## 2021-04-18 DIAGNOSIS — Z5112 Encounter for antineoplastic immunotherapy: Secondary | ICD-10-CM | POA: Diagnosis not present

## 2021-04-18 DIAGNOSIS — C3481 Malignant neoplasm of overlapping sites of right bronchus and lung: Secondary | ICD-10-CM

## 2021-04-18 DIAGNOSIS — Z79899 Other long term (current) drug therapy: Secondary | ICD-10-CM | POA: Insufficient documentation

## 2021-04-18 DIAGNOSIS — C3432 Malignant neoplasm of lower lobe, left bronchus or lung: Secondary | ICD-10-CM | POA: Diagnosis not present

## 2021-04-18 LAB — HEPATIC FUNCTION PANEL
ALT: 16 U/L (ref 7–35)
AST: 23 (ref 13–35)
Alkaline Phosphatase: 59 (ref 25–125)
Bilirubin, Total: 0.6

## 2021-04-18 LAB — BASIC METABOLIC PANEL
BUN: 8 (ref 4–21)
CO2: 27 — AB (ref 13–22)
Chloride: 102 (ref 99–108)
Creatinine: 0.5 (ref 0.5–1.1)
Glucose: 85
Potassium: 3.8 mEq/L (ref 3.5–5.1)
Sodium: 136 — AB (ref 137–147)

## 2021-04-18 LAB — CBC AND DIFFERENTIAL
HCT: 42 (ref 36–46)
Hemoglobin: 13.4 (ref 12.0–16.0)
Neutrophils Absolute: 8.45
Platelets: 299 10*3/uL (ref 150–400)
WBC: 10.7

## 2021-04-18 LAB — COMPREHENSIVE METABOLIC PANEL
Albumin: 4.3 (ref 3.5–5.0)
Calcium: 9 (ref 8.7–10.7)

## 2021-04-18 LAB — CBC: RBC: 4.49 (ref 3.87–5.11)

## 2021-04-18 LAB — TSH: TSH: 2.51 u[IU]/mL (ref 0.350–4.500)

## 2021-04-19 LAB — T4: T4, Total: 9.2 ug/dL (ref 4.5–12.0)

## 2021-04-20 ENCOUNTER — Inpatient Hospital Stay: Payer: Federal, State, Local not specified - PPO

## 2021-04-20 VITALS — BP 109/63 | HR 73 | Temp 98.5°F | Resp 18 | Ht 65.0 in | Wt 105.2 lb

## 2021-04-20 DIAGNOSIS — Z5112 Encounter for antineoplastic immunotherapy: Secondary | ICD-10-CM | POA: Diagnosis not present

## 2021-04-20 DIAGNOSIS — C3481 Malignant neoplasm of overlapping sites of right bronchus and lung: Secondary | ICD-10-CM

## 2021-04-20 DIAGNOSIS — Z79899 Other long term (current) drug therapy: Secondary | ICD-10-CM | POA: Diagnosis not present

## 2021-04-20 DIAGNOSIS — C3432 Malignant neoplasm of lower lobe, left bronchus or lung: Secondary | ICD-10-CM | POA: Diagnosis not present

## 2021-04-20 MED ORDER — SODIUM CHLORIDE 0.9 % IV SOLN: Freq: Once | INTRAVENOUS | Status: AC

## 2021-04-20 MED ORDER — HEPARIN SOD (PORK) LOCK FLUSH 100 UNIT/ML IV SOLN
500.0000 [IU] | Freq: Once | INTRAVENOUS | Status: AC | PRN
  Administered 2021-04-20: 500 [IU]

## 2021-04-20 MED ORDER — SODIUM CHLORIDE 0.9% FLUSH
10.0000 mL | INTRAVENOUS | Status: DC | PRN
  Administered 2021-04-20: 10 mL

## 2021-04-20 MED ORDER — SODIUM CHLORIDE 0.9 % IV SOLN
200.0000 mg | Freq: Once | INTRAVENOUS | Status: AC
  Administered 2021-04-20: 200 mg via INTRAVENOUS
  Filled 2021-04-20: qty 8

## 2021-04-20 NOTE — Patient Instructions (Signed)
[facility]  Discharge Instructions: ?Thank you for choosing [facility] to provide your oncology and hematology care.  ?If you have a lab appointment with the [facility], please go directly to the [facility] and check in at the registration area. ?  ?Wear comfortable clothing and clothing appropriate for easy access to any Portacath or PICC line.  ? ?We strive to give you quality time with your provider. You may need to reschedule your appointment if you arrive late (15 or more minutes).  Arriving late affects you and other patients whose appointments are after yours.  Also, if you miss three or more appointments without notifying the office, you may be dismissed from the clinic at the provider?s discretion.    ?  ?For prescription refill requests, have your pharmacy contact our office and allow 72 hours for refills to be completed.   ? ?Today you received the following chemotherapy and/or immunotherapy agents Pembrolizumab    ?  ?To help prevent nausea and vomiting after your treatment, we encourage you to take your nausea medication as directed. ? ?BELOW ARE SYMPTOMS THAT SHOULD BE REPORTED IMMEDIATELY: ?*FEVER GREATER THAN 100.4 F (38 ?C) OR HIGHER ?*CHILLS OR SWEATING ?*NAUSEA AND VOMITING THAT IS NOT CONTROLLED WITH YOUR NAUSEA MEDICATION ?*UNUSUAL SHORTNESS OF BREATH ?*UNUSUAL BRUISING OR BLEEDING ?*URINARY PROBLEMS (pain or burning when urinating, or frequent urination) ?*BOWEL PROBLEMS (unusual diarrhea, constipation, pain near the anus) ?TENDERNESS IN MOUTH AND THROAT WITH OR WITHOUT PRESENCE OF ULCERS (sore throat, sores in mouth, or a toothache) ?UNUSUAL RASH, SWELLING OR PAIN  ?UNUSUAL VAGINAL DISCHARGE OR ITCHING  ? ?Items with * indicate a potential emergency and should be followed up as soon as possible or go to the Emergency Department if any problems should occur. ? ?Please show the CHEMOTHERAPY ALERT CARD or IMMUNOTHERAPY ALERT CARD at check-in to the  Emergency Department and triage nurse. ? ?Should you have questions after your visit or need to cancel or reschedule your appointment, please contact [facility]  Dept: [phone]  and follow the prompts.  Office hours are 8:00 a.m. to 4:30 p.m. Monday - Friday. Please note that voicemails left after 4:00 p.m. may not be returned until the following business day.  We are closed weekends and major holidays. You have access to a nurse at all times for urgent questions. Please call the main number to the clinic Dept: [phone] and follow the prompts. ? ?For any non-urgent questions, you may also contact your provider using MyChart. We now offer e-Visits for anyone 68 and older to request care online for non-urgent symptoms. For details visit [email or web address]. ?  ?Also download the MyChart app! Go to the app store, search "MyChart", open the app, select [facility], and log in with your MyChart username and password. ? ?Due to Covid, a mask is required upon entering the hospital/clinic. If you do not have a mask, one will be given to you upon arrival. For doctor visits, patients may have 1 support person aged 71 or older with them. For treatment visits, patients cannot have anyone with them due to current Covid guidelines and our immunocompromised population.  ? ? ?

## 2021-04-22 ENCOUNTER — Other Ambulatory Visit: Payer: Self-pay

## 2021-04-22 DIAGNOSIS — R0789 Other chest pain: Secondary | ICD-10-CM

## 2021-04-22 DIAGNOSIS — C3481 Malignant neoplasm of overlapping sites of right bronchus and lung: Secondary | ICD-10-CM

## 2021-04-22 MED ORDER — HYDROCODONE-ACETAMINOPHEN 7.5-325 MG PO TABS: 1.0000 | ORAL_TABLET | Freq: Four times a day (QID) | ORAL | 0 refills | Status: DC | PRN

## 2021-04-26 ENCOUNTER — Other Ambulatory Visit: Payer: Self-pay | Admitting: Hematology and Oncology

## 2021-04-26 ENCOUNTER — Encounter: Payer: Self-pay | Admitting: Hematology and Oncology

## 2021-04-26 ENCOUNTER — Telehealth: Payer: Self-pay

## 2021-04-26 DIAGNOSIS — C3481 Malignant neoplasm of overlapping sites of right bronchus and lung: Secondary | ICD-10-CM | POA: Diagnosis not present

## 2021-04-26 DIAGNOSIS — J4540 Moderate persistent asthma, uncomplicated: Secondary | ICD-10-CM | POA: Diagnosis not present

## 2021-04-26 DIAGNOSIS — R0602 Shortness of breath: Secondary | ICD-10-CM | POA: Diagnosis not present

## 2021-04-26 MED ORDER — LEVOFLOXACIN 750 MG PO TABS
750.0000 mg | ORAL_TABLET | Freq: Every day | ORAL | 0 refills | Status: DC
Start: 2021-04-26 — End: 2021-06-01

## 2021-04-26 NOTE — Telephone Encounter (Signed)
-----   Message from Melodye Ped, NP sent at 04/26/2021  1:40 PM EDT ----- ?Regarding: RE: SHOB ?We will send for CTA this afternoon. ?----- Message ----- ?From: Georgette Shell, RN ?Sent: 04/26/2021   1:27 PM EDT ?To: Melodye Ped, NP ?Subject: FW: SHOB                                      ? ? ?----- Message ----- ?From: Georgette Shell, RN ?Sent: 04/26/2021   9:57 AM EDT ?To: Melodye Ped, NP ?Subject: SHOB                                          ? ?Had Keytruda last Thursday, experienced chills on Friday, woke on Monday with O2 sat of 60, today 65 %.  She does have home O2 and placed it on she is sustained at 85% on 2 lt.    ? ? ? ?

## 2021-05-04 ENCOUNTER — Encounter: Payer: Self-pay | Admitting: Oncology

## 2021-05-05 DIAGNOSIS — J4520 Mild intermittent asthma, uncomplicated: Secondary | ICD-10-CM | POA: Diagnosis not present

## 2021-05-06 DIAGNOSIS — Z681 Body mass index (BMI) 19 or less, adult: Secondary | ICD-10-CM | POA: Diagnosis not present

## 2021-05-06 DIAGNOSIS — J9611 Chronic respiratory failure with hypoxia: Secondary | ICD-10-CM | POA: Diagnosis not present

## 2021-05-06 DIAGNOSIS — C3490 Malignant neoplasm of unspecified part of unspecified bronchus or lung: Secondary | ICD-10-CM | POA: Diagnosis not present

## 2021-05-06 DIAGNOSIS — J449 Chronic obstructive pulmonary disease, unspecified: Secondary | ICD-10-CM | POA: Diagnosis not present

## 2021-05-24 ENCOUNTER — Encounter: Payer: Self-pay | Admitting: Oncology

## 2021-05-26 DIAGNOSIS — J4540 Moderate persistent asthma, uncomplicated: Secondary | ICD-10-CM | POA: Diagnosis not present

## 2021-05-29 NOTE — Progress Notes (Signed)
?[facility]  ?[address] ?[place],    [address] ?[phone] ? ?Clinic Day:  05/30/2021 ? ?Referring physician: Janine Limbo, PA-C ? ? ?HISTORY OF PRESENT ILLNESS:  ?The patient is a 56 y.o. female with stage IVA lung adenocarcinoma.  This staging is based upon initial scans showing bilateral lung involvement with her cancer.  She comes in today to be evaluated before heading into her 23rd cycle of maintenance pembrolizumab immunotherapy.  The patient receives maintenance pembrolizumab every 6 weeks.  Although she is doing fine today, the patient states she has had a few days of problems with her last few cycles of immunotherapy.  Included in this was shortness of breath and profound weakness.  However, the symptoms abated approximately 3 to 4 days later.  From a lung cancer standpoint, she denies having any new respiratory symptoms which concern her for overt signs of disease progression.  Of note, she continues to work on a daily basis. ? ?PHYSICAL EXAM:  ?Blood pressure 124/90, pulse 73, temperature 97.8 ?F (36.6 ?C), resp. rate 14, height 5\' 5"  (1.651 m), weight 105 lb 11.2 oz (47.9 kg), SpO2 98 %. ?Wt Readings from Last 3 Encounters:  ?05/30/21 105 lb 11.2 oz (47.9 kg)  ?04/20/21 105 lb 4 oz (47.7 kg)  ?04/18/21 106 lb 11.2 oz (48.4 kg)  ? ?Body mass index is 17.59 kg/m?Marland Kitchen ?Performance status (ECOG): 1 - Symptomatic but completely ambulatory ?Physical Exam ?Constitutional:   ?   Appearance: Normal appearance. She is not ill-appearing.  ?HENT:  ?   Mouth/Throat:  ?   Mouth: Mucous membranes are moist.  ?   Pharynx: Oropharynx is clear. No oropharyngeal exudate or posterior oropharyngeal erythema.  ?Cardiovascular:  ?   Rate and Rhythm: Normal rate and regular rhythm.  ?   Heart sounds: No murmur heard. ?  No friction rub. No gallop.  ?Pulmonary:  ?   Effort: Pulmonary effort is normal. No respiratory distress.  ?   Breath sounds: Normal breath sounds. No wheezing,  rhonchi or rales.  ?Abdominal:  ?   General: Bowel sounds are normal. There is no distension.  ?   Palpations: Abdomen is soft. There is no mass.  ?   Tenderness: There is no abdominal tenderness.  ?Musculoskeletal:     ?   General: No swelling.  ?   Right lower leg: No edema.  ?   Left lower leg: No edema.  ?Lymphadenopathy:  ?   Cervical: No cervical adenopathy.  ?   Upper Body:  ?   Right upper body: No supraclavicular or axillary adenopathy.  ?   Left upper body: No supraclavicular or axillary adenopathy.  ?   Lower Body: No right inguinal adenopathy. No left inguinal adenopathy.  ?Skin: ?   General: Skin is warm.  ?   Coloration: Skin is not jaundiced.  ?   Findings: No lesion or rash.  ?Neurological:  ?   General: No focal deficit present.  ?   Mental Status: She is alert and oriented to person, place, and time. Mental status is at baseline.  ?Psychiatric:     ?   Mood and Affect: Mood normal.     ?   Behavior: Behavior normal.     ?   Thought Content: Thought content normal.  ? ?LABS:  ? Latest Reference Range & Units 05/30/21 00:00  ?Sodium 137 - 147  139 (E)  ?Potassium 3.5 - 5.1 mEq/L 4.0 (E)  ?Chloride 99 -  108  102 (E)  ?CO2 13 - 22  30 ! (E)  ?Glucose  80 (E)  ?BUN 4 - 21  10 (E)  ?Creatinine 0.5 - 1.1  0.5 (E)  ?Calcium 8.7 - 10.7  8.9 (E)  ?Alkaline Phosphatase 25 - 125  65 (E)  ?Albumin 3.5 - 5.0  4.4 (E)  ?AST 13 - 35  23 (E)  ?ALT 7 - 35 U/L 18 (E)  ?Bilirubin, Total  0.6 (E)  ?WBC  8.8 (E)  ?RBC 3.87 - 5.11  4.5 (E)  ?Hemoglobin 12.0 - 16.0  13.5 (E)  ?HCT 36 - 46  41 (E)  ?Platelets 150 - 400 K/uL 417 ! (E)  ?!: Data is abnormal ?(E): External lab result ? ?ASSESSMENT & PLAN:  ?Assessment/Plan:  A 56 y.o. female with stage IVA lung adenocarcinoma.  She will proceed with her 23rd cycle of pemobrolizumab this week.  She will continue receiving her immunotherapy every 6 weeks.  Clinically, she appears to be doing very well.  I will see her back in 6 weeks before she heads into her 24th cycle of  maintenance pembrolizumab immunotherapy. The patient understands all the plans discussed today and knows to contact our office before her next next visit if she was any acute problems after treatment that require immediate clinical attention. ? ?Jabril Pursell Macarthur Critchley, MD ? ? ? ?  ?

## 2021-05-30 ENCOUNTER — Inpatient Hospital Stay: Payer: Federal, State, Local not specified - PPO | Attending: Oncology | Admitting: Oncology

## 2021-05-30 ENCOUNTER — Inpatient Hospital Stay: Payer: Federal, State, Local not specified - PPO

## 2021-05-30 VITALS — BP 124/90 | HR 73 | Temp 97.8°F | Resp 14 | Ht 65.0 in | Wt 105.7 lb

## 2021-05-30 DIAGNOSIS — C3481 Malignant neoplasm of overlapping sites of right bronchus and lung: Secondary | ICD-10-CM

## 2021-05-30 DIAGNOSIS — Z5112 Encounter for antineoplastic immunotherapy: Secondary | ICD-10-CM | POA: Insufficient documentation

## 2021-05-30 DIAGNOSIS — Z79899 Other long term (current) drug therapy: Secondary | ICD-10-CM | POA: Insufficient documentation

## 2021-05-30 DIAGNOSIS — C3432 Malignant neoplasm of lower lobe, left bronchus or lung: Secondary | ICD-10-CM | POA: Diagnosis not present

## 2021-05-30 LAB — HEPATIC FUNCTION PANEL
ALT: 18 U/L (ref 7–35)
AST: 23 (ref 13–35)
Alkaline Phosphatase: 65 (ref 25–125)
Bilirubin, Total: 0.6

## 2021-05-30 LAB — BASIC METABOLIC PANEL
BUN: 10 (ref 4–21)
CO2: 30 — AB (ref 13–22)
Chloride: 102 (ref 99–108)
Creatinine: 0.5 (ref 0.5–1.1)
Glucose: 80
Potassium: 4 mEq/L (ref 3.5–5.1)
Sodium: 139 (ref 137–147)

## 2021-05-30 LAB — CBC AND DIFFERENTIAL
HCT: 41 (ref 36–46)
Hemoglobin: 13.5 (ref 12.0–16.0)
Neutrophils Absolute: 6.6
Platelets: 417 10*3/uL — AB (ref 150–400)
WBC: 8.8

## 2021-05-30 LAB — TSH: TSH: 2.254 u[IU]/mL (ref 0.350–4.500)

## 2021-05-30 LAB — CBC: RBC: 4.5 (ref 3.87–5.11)

## 2021-05-30 LAB — COMPREHENSIVE METABOLIC PANEL
Albumin: 4.4 (ref 3.5–5.0)
Calcium: 8.9 (ref 8.7–10.7)

## 2021-05-30 LAB — T4, FREE: Free T4: 0.83 ng/dL (ref 0.61–1.12)

## 2021-05-31 ENCOUNTER — Other Ambulatory Visit: Payer: Self-pay

## 2021-05-31 DIAGNOSIS — R0789 Other chest pain: Secondary | ICD-10-CM

## 2021-05-31 DIAGNOSIS — C3481 Malignant neoplasm of overlapping sites of right bronchus and lung: Secondary | ICD-10-CM

## 2021-05-31 MED ORDER — HYDROCODONE-ACETAMINOPHEN 7.5-325 MG PO TABS: 1.0000 | ORAL_TABLET | Freq: Four times a day (QID) | ORAL | 0 refills | Status: DC | PRN

## 2021-06-01 ENCOUNTER — Inpatient Hospital Stay: Payer: Federal, State, Local not specified - PPO

## 2021-06-01 ENCOUNTER — Encounter: Payer: Self-pay | Admitting: Oncology

## 2021-06-01 VITALS — BP 137/63 | HR 77 | Temp 97.9°F | Resp 16 | Ht 65.0 in | Wt 106.0 lb

## 2021-06-01 DIAGNOSIS — C3432 Malignant neoplasm of lower lobe, left bronchus or lung: Secondary | ICD-10-CM | POA: Diagnosis not present

## 2021-06-01 DIAGNOSIS — C3481 Malignant neoplasm of overlapping sites of right bronchus and lung: Secondary | ICD-10-CM

## 2021-06-01 DIAGNOSIS — Z79899 Other long term (current) drug therapy: Secondary | ICD-10-CM | POA: Diagnosis not present

## 2021-06-01 DIAGNOSIS — Z5112 Encounter for antineoplastic immunotherapy: Secondary | ICD-10-CM | POA: Diagnosis not present

## 2021-06-01 MED ORDER — SODIUM CHLORIDE 0.9 % IV SOLN
200.0000 mg | Freq: Once | INTRAVENOUS | Status: AC
  Administered 2021-06-01: 200 mg via INTRAVENOUS
  Filled 2021-06-01: qty 8

## 2021-06-01 MED ORDER — SODIUM CHLORIDE 0.9 % IV SOLN: Freq: Once | INTRAVENOUS | Status: AC

## 2021-06-01 MED ORDER — HEPARIN SOD (PORK) LOCK FLUSH 100 UNIT/ML IV SOLN
500.0000 [IU] | Freq: Once | INTRAVENOUS | Status: AC | PRN
  Administered 2021-06-01: 500 [IU]

## 2021-06-01 MED ORDER — SODIUM CHLORIDE 0.9% FLUSH
10.0000 mL | INTRAVENOUS | Status: DC | PRN
  Administered 2021-06-01: 10 mL

## 2021-06-01 NOTE — Patient Instructions (Signed)
Pembrolizumab injection ?What is this medication? ?PEMBROLIZUMAB (pem broe liz ue mab) is a monoclonal antibody. It is used to treat certain types of cancer. ?This medicine may be used for other purposes; ask your health care provider or pharmacist if you have questions. ?COMMON BRAND NAME(S): Keytruda ?What should I tell my care team before I take this medication? ?They need to know if you have any of these conditions: ?autoimmune diseases like Crohn's disease, ulcerative colitis, or lupus ?have had or planning to have an allogeneic stem cell transplant (uses someone else's stem cells) ?history of organ transplant ?history of chest radiation ?nervous system problems like myasthenia gravis or Guillain-Barre syndrome ?an unusual or allergic reaction to pembrolizumab, other medicines, foods, dyes, or preservatives ?pregnant or trying to get pregnant ?breast-feeding ?How should I use this medication? ?This medicine is for infusion into a vein. It is given by a health care professional in a hospital or clinic setting. ?A special MedGuide will be given to you before each treatment. Be sure to read this information carefully each time. ?Talk to your pediatrician regarding the use of this medicine in children. While this drug may be prescribed for children as young as 6 months for selected conditions, precautions do apply. ?Overdosage: If you think you have taken too much of this medicine contact a poison control center or emergency room at once. ?NOTE: This medicine is only for you. Do not share this medicine with others. ?What if I miss a dose? ?It is important not to miss your dose. Call your doctor or health care professional if you are unable to keep an appointment. ?What may interact with this medication? ?Interactions have not been studied. ?This list may not describe all possible interactions. Give your health care provider a list of all the medicines, herbs, non-prescription drugs, or dietary supplements you use.  Also tell them if you smoke, drink alcohol, or use illegal drugs. Some items may interact with your medicine. ?What should I watch for while using this medication? ?Your condition will be monitored carefully while you are receiving this medicine. ?You may need blood work done while you are taking this medicine. ?Do not become pregnant while taking this medicine or for 4 months after stopping it. Women should inform their doctor if they wish to become pregnant or think they might be pregnant. There is a potential for serious side effects to an unborn child. Talk to your health care professional or pharmacist for more information. Do not breast-feed an infant while taking this medicine or for 4 months after the last dose. ?What side effects may I notice from receiving this medication? ?Side effects that you should report to your doctor or health care professional as soon as possible: ?allergic reactions like skin rash, itching or hives, swelling of the face, lips, or tongue ?bloody or black, tarry ?breathing problems ?changes in vision ?chest pain ?chills ?confusion ?constipation ?cough ?diarrhea ?dizziness or feeling faint or lightheaded ?fast or irregular heartbeat ?fever ?flushing ?joint pain ?low blood counts - this medicine may decrease the number of white blood cells, red blood cells and platelets. You may be at increased risk for infections and bleeding. ?muscle pain ?muscle weakness ?pain, tingling, numbness in the hands or feet ?persistent headache ?redness, blistering, peeling or loosening of the skin, including inside the mouth ?signs and symptoms of high blood sugar such as dizziness; dry mouth; dry skin; fruity breath; nausea; stomach pain; increased hunger or thirst; increased urination ?signs and symptoms of kidney injury like trouble  passing urine or change in the amount of urine ?signs and symptoms of liver injury like dark urine, light-colored stools, loss of appetite, nausea, right upper belly pain,  yellowing of the eyes or skin ?sweating ?swollen lymph nodes ?weight loss ?Side effects that usually do not require medical attention (report to your doctor or health care professional if they continue or are bothersome): ?decreased appetite ?hair loss ?tiredness ?This list may not describe all possible side effects. Call your doctor for medical advice about side effects. You may report side effects to FDA at 1-800-FDA-1088. ?Where should I keep my medication? ?This drug is given in a hospital or clinic and will not be stored at home. ?NOTE: This sheet is a summary. It may not cover all possible information. If you have questions about this medicine, talk to your doctor, pharmacist, or health care provider. ?? 2023 Elsevier/Gold Standard (2020-12-10 00:00:00) ? ?

## 2021-06-01 NOTE — Progress Notes (Signed)
Patient reports that after the last two treatments she developed a cough. States she has had a CT and has been treated for pneumonia around the same time- Patient instructed to call for any return of symptoms/worsening of symptoms- states understanding ? ?

## 2021-06-04 DIAGNOSIS — J4520 Mild intermittent asthma, uncomplicated: Secondary | ICD-10-CM | POA: Diagnosis not present

## 2021-06-26 DIAGNOSIS — J4540 Moderate persistent asthma, uncomplicated: Secondary | ICD-10-CM | POA: Diagnosis not present

## 2021-07-05 DIAGNOSIS — J4520 Mild intermittent asthma, uncomplicated: Secondary | ICD-10-CM | POA: Diagnosis not present

## 2021-07-10 NOTE — Progress Notes (Signed)
[facility]  [address] [place],  [place]  [address] [phone]  Clinic Day:  07/11/2021  Referring physician: Janine Limbo, PA-C   HISTORY OF PRESENT ILLNESS:  The patient is a 56 y.o. female with stage IVA lung adenocarcinoma.  This staging is based upon initial scans showing bilateral lung involvement with her cancer.  She comes in today to be evaluated before heading into her 24th cycle of maintenance pembrolizumab immunotherapy, which she receives every 6 weeks.  Since her last visit, the patient has been doing well.  From a lung cancer standpoint, she denies having any new respiratory symptoms which concern her for overt signs of disease progression.  Of note, she continues to work on a daily basis.  PHYSICAL EXAM:  Blood pressure 118/78, pulse 77, temperature 97.7 F (36.5 C), resp. rate 14, height 5\' 5"  (1.651 m), weight 107 lb 4.8 oz (48.7 kg), SpO2 97 %. Wt Readings from Last 3 Encounters:  07/11/21 107 lb 4.8 oz (48.7 kg)  06/01/21 106 lb (48.1 kg)  05/30/21 105 lb 11.2 oz (47.9 kg)   Body mass index is 17.86 kg/m. Performance status (ECOG): 1 - Symptomatic but completely ambulatory Physical Exam Constitutional:      Appearance: Normal appearance. She is not ill-appearing.  HENT:     Mouth/Throat:     Mouth: Mucous membranes are moist.     Pharynx: Oropharynx is clear. No oropharyngeal exudate or posterior oropharyngeal erythema.  Cardiovascular:     Rate and Rhythm: Normal rate and regular rhythm.     Heart sounds: No murmur heard.    No friction rub. No gallop.  Pulmonary:     Effort: Pulmonary effort is normal. No respiratory distress.     Breath sounds: Normal breath sounds. No wheezing, rhonchi or rales.  Abdominal:     General: Bowel sounds are normal. There is no distension.     Palpations: Abdomen is soft. There is no mass.     Tenderness: There is no abdominal tenderness.  Musculoskeletal:        General: No  swelling.     Right lower leg: No edema.     Left lower leg: No edema.  Lymphadenopathy:     Cervical: No cervical adenopathy.     Upper Body:     Right upper body: No supraclavicular or axillary adenopathy.     Left upper body: No supraclavicular or axillary adenopathy.     Lower Body: No right inguinal adenopathy. No left inguinal adenopathy.  Skin:    General: Skin is warm.     Coloration: Skin is not jaundiced.     Findings: No lesion or rash.  Neurological:     General: No focal deficit present.     Mental Status: She is alert and oriented to person, place, and time. Mental status is at baseline.  Psychiatric:        Mood and Affect: Mood normal.        Behavior: Behavior normal.        Thought Content: Thought content normal.    LABS:   Latest Reference Range & Units 07/11/21 00:00  Sodium 137 - 147  136 ! (E)  Potassium 3.5 - 5.1 mEq/L 3.9 (E)  Chloride 99 - 108  102 (E)  CO2 13 - 22  25 ! (E)  Glucose  68 (E)  BUN 4 - 21  9 (E)  Creatinine 0.5 - 1.1  0.5 (E)  Calcium 8.7 -  10.7  8.7 (E)  Alkaline Phosphatase 25 - 125  67 (E)  Albumin 3.5 - 5.0  4.4 (E)  AST 13 - 35  27 (E)  ALT 7 - 35 U/L 18 (E)  Bilirubin, Total  0.6 (E)  WBC  7.1 (E)  RBC 3.87 - 5.11  4.52 (E)  Hemoglobin 12.0 - 16.0  13.9 (E)  HCT 36 - 46  42 (E)  Platelets 150 - 400 K/uL 301 (E)  NEUT#  4.54 (E)  !: Data is abnormal (E): External lab result  ASSESSMENT & PLAN:  Assessment/Plan:  A 56 y.o. female with stage IVA lung adenocarcinoma.  She will proceed with her 24th cycle of pemobrolizumab this week.  She will continue receiving her immunotherapy every 6 weeks.  Clinically, she appears to be doing very well.  I will see her back in 6 weeks before she heads into her 25th cycle of maintenance pembrolizumab immunotherapy.  A chest CT will be done today before her next visit to ascertain her new disease baseline after 24 cycles of immunology.  The patient understands all the plans discussed today  and is in agreement with them.    Milany Geck Macarthur Critchley, MD

## 2021-07-11 ENCOUNTER — Other Ambulatory Visit: Payer: Self-pay | Admitting: Oncology

## 2021-07-11 ENCOUNTER — Inpatient Hospital Stay: Payer: Federal, State, Local not specified - PPO | Attending: Oncology | Admitting: Oncology

## 2021-07-11 ENCOUNTER — Telehealth: Payer: Self-pay | Admitting: Oncology

## 2021-07-11 ENCOUNTER — Inpatient Hospital Stay: Payer: Federal, State, Local not specified - PPO

## 2021-07-11 ENCOUNTER — Other Ambulatory Visit: Payer: Self-pay

## 2021-07-11 VITALS — BP 118/78 | HR 77 | Temp 97.7°F | Resp 14 | Ht 65.0 in | Wt 107.3 lb

## 2021-07-11 DIAGNOSIS — Z79899 Other long term (current) drug therapy: Secondary | ICD-10-CM | POA: Insufficient documentation

## 2021-07-11 DIAGNOSIS — C3481 Malignant neoplasm of overlapping sites of right bronchus and lung: Secondary | ICD-10-CM

## 2021-07-11 DIAGNOSIS — Z5112 Encounter for antineoplastic immunotherapy: Secondary | ICD-10-CM | POA: Diagnosis not present

## 2021-07-11 DIAGNOSIS — C3432 Malignant neoplasm of lower lobe, left bronchus or lung: Secondary | ICD-10-CM | POA: Diagnosis not present

## 2021-07-11 LAB — BASIC METABOLIC PANEL
BUN: 9 (ref 4–21)
CO2: 25 — AB (ref 13–22)
Chloride: 102 (ref 99–108)
Creatinine: 0.5 (ref 0.5–1.1)
Glucose: 68
Potassium: 3.9 mEq/L (ref 3.5–5.1)
Sodium: 136 — AB (ref 137–147)

## 2021-07-11 LAB — COMPREHENSIVE METABOLIC PANEL
Albumin: 4.4 (ref 3.5–5.0)
Calcium: 8.7 (ref 8.7–10.7)

## 2021-07-11 LAB — CBC AND DIFFERENTIAL
HCT: 42 (ref 36–46)
Hemoglobin: 13.9 (ref 12.0–16.0)
Neutrophils Absolute: 4.54
Platelets: 301 10*3/uL (ref 150–400)
WBC: 7.1

## 2021-07-11 LAB — T4, FREE: Free T4: 0.97 ng/dL (ref 0.61–1.12)

## 2021-07-11 LAB — HEPATIC FUNCTION PANEL
ALT: 18 U/L (ref 7–35)
AST: 27 (ref 13–35)
Alkaline Phosphatase: 67 (ref 25–125)
Bilirubin, Total: 0.6

## 2021-07-11 LAB — CBC: RBC: 4.52 (ref 3.87–5.11)

## 2021-07-11 LAB — TSH: TSH: 3.33 u[IU]/mL (ref 0.350–4.500)

## 2021-07-11 NOTE — Telephone Encounter (Signed)
Per 07/11/21 los next appt scheduled and confirmed with patient

## 2021-07-12 MED FILL — Pembrolizumab IV Soln 100 MG/4ML (25 MG/ML): INTRAVENOUS | Qty: 8 | Status: AC

## 2021-07-13 ENCOUNTER — Inpatient Hospital Stay: Payer: Federal, State, Local not specified - PPO

## 2021-07-13 ENCOUNTER — Other Ambulatory Visit: Payer: Self-pay | Admitting: Hematology and Oncology

## 2021-07-13 DIAGNOSIS — Z5112 Encounter for antineoplastic immunotherapy: Secondary | ICD-10-CM | POA: Diagnosis not present

## 2021-07-13 DIAGNOSIS — C3481 Malignant neoplasm of overlapping sites of right bronchus and lung: Secondary | ICD-10-CM

## 2021-07-13 DIAGNOSIS — C3432 Malignant neoplasm of lower lobe, left bronchus or lung: Secondary | ICD-10-CM | POA: Diagnosis not present

## 2021-07-13 DIAGNOSIS — Z79899 Other long term (current) drug therapy: Secondary | ICD-10-CM | POA: Diagnosis not present

## 2021-07-13 DIAGNOSIS — R0789 Other chest pain: Secondary | ICD-10-CM

## 2021-07-13 MED ORDER — SODIUM CHLORIDE 0.9 % IV SOLN: Freq: Once | INTRAVENOUS | Status: AC

## 2021-07-13 MED ORDER — SODIUM CHLORIDE 0.9 % IV SOLN
200.0000 mg | Freq: Once | INTRAVENOUS | Status: AC
  Administered 2021-07-13: 200 mg via INTRAVENOUS
  Filled 2021-07-13: qty 8

## 2021-07-13 MED ORDER — HYDROCODONE-ACETAMINOPHEN 7.5-325 MG PO TABS: 1.0000 | ORAL_TABLET | Freq: Four times a day (QID) | ORAL | 0 refills | Status: DC | PRN

## 2021-07-26 DIAGNOSIS — J4540 Moderate persistent asthma, uncomplicated: Secondary | ICD-10-CM | POA: Diagnosis not present

## 2021-08-04 DIAGNOSIS — J4520 Mild intermittent asthma, uncomplicated: Secondary | ICD-10-CM | POA: Diagnosis not present

## 2021-08-08 DIAGNOSIS — E785 Hyperlipidemia, unspecified: Secondary | ICD-10-CM | POA: Diagnosis not present

## 2021-08-08 DIAGNOSIS — B001 Herpesviral vesicular dermatitis: Secondary | ICD-10-CM | POA: Diagnosis not present

## 2021-08-08 DIAGNOSIS — Z79899 Other long term (current) drug therapy: Secondary | ICD-10-CM | POA: Diagnosis not present

## 2021-08-08 DIAGNOSIS — C3490 Malignant neoplasm of unspecified part of unspecified bronchus or lung: Secondary | ICD-10-CM | POA: Diagnosis not present

## 2021-08-08 DIAGNOSIS — I10 Essential (primary) hypertension: Secondary | ICD-10-CM | POA: Diagnosis not present

## 2021-08-19 NOTE — Progress Notes (Unsigned)
[facility]  [address] [place],  [place]  [address] [phone]  Clinic Day:  08/23/2021  Referring physician: Janine Limbo, PA-C   HISTORY OF PRESENT ILLNESS:  The patient is a 56 y.o. female with stage IVA lung adenocarcinoma.  This staging is based upon initial scans showing bilateral lung involvement with her cancer.  She comes in today to be evaluated before heading into her 25th cycle of maintenance pembrolizumab immunotherapy, which she receives every 6 weeks.  Since her last visit, the patient has been doing well.  She denies any adverse effects from her immunotherapy.  From a lung cancer standpoint, she denies having any new respiratory symptoms which concern her for overt signs of disease progression.  Of note, she continues to work on a daily basis.   PHYSICAL EXAM:  Blood pressure 123/63, pulse 69, temperature 97.7 F (36.5 C), temperature source Oral, resp. rate 20, height 5\' 5"  (1.651 m), weight 106 lb (48.1 kg), SpO2 99 %. Wt Readings from Last 3 Encounters:  08/23/21 106 lb (48.1 kg)  07/11/21 107 lb 4.8 oz (48.7 kg)  06/01/21 106 lb (48.1 kg)   Body mass index is 17.64 kg/m.  Performance status (ECOG): 0 - Asymptomatic  Physical Exam Vitals and nursing note reviewed.  Constitutional:      General: She is not in acute distress.    Appearance: Normal appearance.  HENT:     Head: Normocephalic and atraumatic.     Mouth/Throat:     Mouth: Mucous membranes are moist.     Pharynx: Oropharynx is clear. No oropharyngeal exudate or posterior oropharyngeal erythema.  Eyes:     General: No scleral icterus.    Extraocular Movements: Extraocular movements intact.     Conjunctiva/sclera: Conjunctivae normal.     Pupils: Pupils are equal, round, and reactive to light.  Cardiovascular:     Rate and Rhythm: Normal rate and regular rhythm.     Heart sounds: Normal heart sounds. No murmur heard.    No friction rub. No gallop.   Pulmonary:     Effort: Pulmonary effort is normal.     Breath sounds: Normal breath sounds. No wheezing, rhonchi or rales.  Abdominal:     General: There is no distension.     Palpations: Abdomen is soft. There is no hepatomegaly, splenomegaly or mass.     Tenderness: There is no abdominal tenderness.  Musculoskeletal:        General: Normal range of motion.     Cervical back: Normal range of motion and neck supple. No tenderness.     Right lower leg: No edema.     Left lower leg: No edema.  Lymphadenopathy:     Cervical: No cervical adenopathy.     Upper Body:     Right upper body: No supraclavicular or axillary adenopathy.     Left upper body: No supraclavicular or axillary adenopathy.     Lower Body: No right inguinal adenopathy. No left inguinal adenopathy.  Skin:    General: Skin is warm and dry.     Coloration: Skin is not jaundiced.     Findings: No rash.  Neurological:     Mental Status: She is alert and oriented to person, place, and time.     Cranial Nerves: No cranial nerve deficit.  Psychiatric:        Mood and Affect: Mood normal.        Behavior: Behavior normal.  Thought Content: Thought content normal.     LABS:      Latest Ref Rng & Units 08/22/2021   12:00 AM 07/11/2021   12:00 AM 05/30/2021   12:00 AM  CBC  WBC  8.2     7.1     8.8      Hemoglobin 12.0 - 16.0 13.5     13.9     13.5      Hematocrit 36 - 46 40     42     41      Platelets 150 - 400 K/uL 261     301     417         This result is from an external source.      Latest Ref Rng & Units 08/22/2021   12:00 AM 07/11/2021   12:00 AM 05/30/2021   12:00 AM  CMP  BUN 4 - 21 10     9     10       Creatinine 0.5 - 1.1 0.5     0.5     0.5      Sodium 137 - 147 134     136     139      Potassium 3.5 - 5.1 mEq/L 3.9     3.9     4.0      Chloride 99 - 108 104     102     102      CO2 13 - 22 23     25     30       Calcium 8.7 - 10.7 8.9     8.7     8.9      Alkaline Phos 25 - 125 52     67      65      AST 13 - 35 32     27     23      ALT 7 - 35 U/L 23     18     18          This result is from an external source.     No results found for: "CEA1", "CEA" / No results found for: "CEA1", "CEA" No results found for: "PSA1" No results found for: "SKA768" No results found for: "CAN125"  No results found for: "TOTALPROTELP", "ALBUMINELP", "A1GS", "A2GS", "BETS", "BETA2SER", "GAMS", "MSPIKE", "SPEI" No results found for: "TIBC", "FERRITIN", "IRONPCTSAT" No results found for: "LDH"  No results found for: "AFPTUMOR", "TOTALPROTELP", "ALBUMINELP", "A1GS", "A2GS", "BETS", "BETA2SER", "GAMS", "MSPIKE", "SPEI", "LDH", "CEA1", "CEA", "PSA1", "IGASERUM", "IGGSERUM", "IGMSERUM", "THGAB", "THYROGLB"  Review Flowsheet        No data to display           STUDIES:  No results found.    Exam(s): [id number] CT/CT CHEST W/ CM  CLINICAL DATA: History of lung cancer currently on chemo  immunotherapy. Restaging.  * Tracking Code: BO *  EXAM:  CT CHEST WITH CONTRAST  TECHNIQUE:  Multidetector CT imaging of the chest was performed during  intravenous contrast administration.   RADIATION DOSE REDUCTION: This exam was performed according to the  departmental dose-optimization program which includes automated  exposure control, adjustment of the mA and/or kV according to  patient size and/or use of iterative reconstruction technique.  CONTRAST: 60 cc Isovue 370 IV.   COMPARISON: 04/26/2021 chest CT angiogram. 03/07/2021 chest CT.  FINDINGS:   Cardiovascular: Normal heart size. No significant pericardial  effusion/thickening.  Three-vessel coronary atherosclerosis. Right  subclavian Port-A-Cath terminates in the middle third of the SVC.  Atherosclerotic nonaneurysmal thoracic aorta. Normal caliber  pulmonary arteries. No central pulmonary emboli.   Mediastinum/Nodes: Stable calcified subcentimeter anterior right  thyroid nodule. Not clinically significant; no follow-up imaging   recommended (ref: J Am Coll Radiol. 2015 Feb;12(2): 143-50).  Unremarkable esophagus. No axillary adenopathy. Mildly enlarged 1.0  cm short axis diameter left paratracheal node (series 2/image 51),  stable from 04/26/2021 and 03/07/2021 chest CT studies. No new  pathologically enlarged mediastinal nodes. No pathologically  enlarged hilar nodes.   Lungs/Pleura: No pneumothorax. No pleural effusion. Severe  centrilobular emphysema. No acute consolidative airspace disease or  lung masses. Numerous solid pulmonary nodules scattered throughout  both lungs, largest 0.5 cm in the anterior right lower lobe (Series 301/image 101), all stable since 03/07/2021 chest CT. No new  significant pulmonary nodules.   Upper abdomen: Scattered subcentimeter hypodense left renal cortical  lesions are too small to characterize and are unchanged, for which  no follow-up imaging is recommended.   Musculoskeletal: No aggressive appearing focal osseous lesions.  Partially visualized anterior and posterior cervical spinal fusion  hardware. Mild thoracic spondylosis.   IMPRESSION:  1. Stable chest CT since 03/07/2021. No new or progressive  metastatic disease in the chest.  2. Numerous small solid bilateral pulmonary nodules are all stable.  3. Mild left paratracheal adenopathy is stable.  4. Three-vessel coronary atherosclerosis.  5. Aortic Atherosclerosis (ICD10-I70.0) and Emphysema (ICD10-J43.9).   ASSESSMENT & PLAN:   Assessment/Plan:  A 56 y.o. female with stage IVA lung adenocarcinoma. In clinic today, we reviewed her CT results, which revealed stable disease.  Based upon her disease stability, she will continue maintenance pembrolizumab and proceed with the 25th cycle this week.  She will continue receiving her immunotherapy every 6 weeks.  Clinically, she appears to be doing very well.  I will see her back in 6 weeks before she heads into her 26th cycle of maintenance pembrolizumab immunotherapy.  The  patient understands all the plans discussed today and is in agreement with them.  She knows to contact our office if she develops concerns prior to her next appointment.    Marvia Pickles, PA-C

## 2021-08-22 ENCOUNTER — Other Ambulatory Visit: Payer: Self-pay | Admitting: Oncology

## 2021-08-22 ENCOUNTER — Encounter: Payer: Self-pay | Admitting: Oncology

## 2021-08-22 DIAGNOSIS — C3481 Malignant neoplasm of overlapping sites of right bronchus and lung: Secondary | ICD-10-CM | POA: Diagnosis not present

## 2021-08-22 DIAGNOSIS — I700 Atherosclerosis of aorta: Secondary | ICD-10-CM | POA: Diagnosis not present

## 2021-08-22 DIAGNOSIS — J439 Emphysema, unspecified: Secondary | ICD-10-CM | POA: Diagnosis not present

## 2021-08-22 DIAGNOSIS — R911 Solitary pulmonary nodule: Secondary | ICD-10-CM | POA: Diagnosis not present

## 2021-08-22 DIAGNOSIS — Z79899 Other long term (current) drug therapy: Secondary | ICD-10-CM | POA: Diagnosis not present

## 2021-08-22 DIAGNOSIS — R918 Other nonspecific abnormal finding of lung field: Secondary | ICD-10-CM | POA: Diagnosis not present

## 2021-08-22 LAB — BASIC METABOLIC PANEL
BUN: 10 (ref 4–21)
CO2: 23 — AB (ref 13–22)
Chloride: 104 (ref 99–108)
Creatinine: 0.5 (ref 0.5–1.1)
Glucose: 86
Potassium: 3.9 mEq/L (ref 3.5–5.1)
Sodium: 134 — AB (ref 137–147)

## 2021-08-22 LAB — COMPREHENSIVE METABOLIC PANEL
Albumin: 4.2 (ref 3.5–5.0)
Calcium: 8.9 (ref 8.7–10.7)

## 2021-08-22 LAB — HEPATIC FUNCTION PANEL
ALT: 23 U/L (ref 7–35)
AST: 32 (ref 13–35)
Alkaline Phosphatase: 52 (ref 25–125)
Bilirubin, Total: 0.6

## 2021-08-22 LAB — CBC: RBC: 4.37 (ref 3.87–5.11)

## 2021-08-22 LAB — TSH: TSH: 2.14 (ref 0.41–5.90)

## 2021-08-22 LAB — CBC AND DIFFERENTIAL
HCT: 40 (ref 36–46)
Hemoglobin: 13.5 (ref 12.0–16.0)
Neutrophils Absolute: 5.99
Platelets: 261 10*3/uL (ref 150–400)
WBC: 8.2

## 2021-08-23 ENCOUNTER — Encounter: Payer: Self-pay | Admitting: Hematology and Oncology

## 2021-08-23 ENCOUNTER — Inpatient Hospital Stay
Payer: Federal, State, Local not specified - PPO | Attending: Hematology and Oncology | Admitting: Hematology and Oncology

## 2021-08-23 VITALS — BP 123/63 | HR 69 | Temp 97.7°F | Resp 20 | Ht 65.0 in | Wt 106.0 lb

## 2021-08-23 DIAGNOSIS — J439 Emphysema, unspecified: Secondary | ICD-10-CM | POA: Insufficient documentation

## 2021-08-23 DIAGNOSIS — Z5112 Encounter for antineoplastic immunotherapy: Secondary | ICD-10-CM | POA: Insufficient documentation

## 2021-08-23 DIAGNOSIS — C3481 Malignant neoplasm of overlapping sites of right bronchus and lung: Secondary | ICD-10-CM | POA: Insufficient documentation

## 2021-08-23 DIAGNOSIS — C3432 Malignant neoplasm of lower lobe, left bronchus or lung: Secondary | ICD-10-CM | POA: Insufficient documentation

## 2021-08-23 DIAGNOSIS — I700 Atherosclerosis of aorta: Secondary | ICD-10-CM | POA: Insufficient documentation

## 2021-08-23 MED FILL — Pembrolizumab IV Soln 100 MG/4ML (25 MG/ML): INTRAVENOUS | Qty: 8 | Status: AC

## 2021-08-24 ENCOUNTER — Inpatient Hospital Stay: Payer: Federal, State, Local not specified - PPO

## 2021-08-24 VITALS — BP 133/69 | HR 81 | Temp 98.0°F | Resp 18 | Ht 65.0 in | Wt 107.0 lb

## 2021-08-24 DIAGNOSIS — C3481 Malignant neoplasm of overlapping sites of right bronchus and lung: Secondary | ICD-10-CM

## 2021-08-24 DIAGNOSIS — J439 Emphysema, unspecified: Secondary | ICD-10-CM | POA: Diagnosis not present

## 2021-08-24 DIAGNOSIS — C3432 Malignant neoplasm of lower lobe, left bronchus or lung: Secondary | ICD-10-CM | POA: Diagnosis not present

## 2021-08-24 DIAGNOSIS — Z5112 Encounter for antineoplastic immunotherapy: Secondary | ICD-10-CM | POA: Diagnosis not present

## 2021-08-24 DIAGNOSIS — I700 Atherosclerosis of aorta: Secondary | ICD-10-CM | POA: Diagnosis not present

## 2021-08-24 MED ORDER — SODIUM CHLORIDE 0.9 % IV SOLN
200.0000 mg | Freq: Once | INTRAVENOUS | Status: AC
  Administered 2021-08-24: 200 mg via INTRAVENOUS
  Filled 2021-08-24: qty 8

## 2021-08-24 MED ORDER — SODIUM CHLORIDE 0.9 % IV SOLN: Freq: Once | INTRAVENOUS | Status: AC

## 2021-08-24 MED ORDER — HEPARIN SOD (PORK) LOCK FLUSH 100 UNIT/ML IV SOLN: 500.0000 [IU] | Freq: Once | INTRAVENOUS | Status: DC | PRN

## 2021-08-24 MED ORDER — SODIUM CHLORIDE 0.9% FLUSH: 10.0000 mL | INTRAVENOUS | Status: DC | PRN

## 2021-08-24 NOTE — Patient Instructions (Signed)
Pembrolizumab injection What is this medication? PEMBROLIZUMAB (pem broe liz ue mab) is a monoclonal antibody. It is used to treat certain types of cancer. This medicine may be used for other purposes; ask your health care provider or pharmacist if you have questions. COMMON BRAND NAME(S): Keytruda What should I tell my care team before I take this medication? They need to know if you have any of these conditions: autoimmune diseases like Crohn's disease, ulcerative colitis, or lupus have had or planning to have an allogeneic stem cell transplant (uses someone else's stem cells) history of organ transplant history of chest radiation nervous system problems like myasthenia gravis or Guillain-Barre syndrome an unusual or allergic reaction to pembrolizumab, other medicines, foods, dyes, or preservatives pregnant or trying to get pregnant breast-feeding How should I use this medication? This medicine is for infusion into a vein. It is given by a health care professional in a hospital or clinic setting. A special MedGuide will be given to you before each treatment. Be sure to read this information carefully each time. Talk to your pediatrician regarding the use of this medicine in children. While this drug may be prescribed for children as young as 6 months for selected conditions, precautions do apply. Overdosage: If you think you have taken too much of this medicine contact a poison control center or emergency room at once. NOTE: This medicine is only for you. Do not share this medicine with others. What if I miss a dose? It is important not to miss your dose. Call your doctor or health care professional if you are unable to keep an appointment. What may interact with this medication? Interactions have not been studied. This list may not describe all possible interactions. Give your health care provider a list of all the medicines, herbs, non-prescription drugs, or dietary supplements you use.  Also tell them if you smoke, drink alcohol, or use illegal drugs. Some items may interact with your medicine. What should I watch for while using this medication? Your condition will be monitored carefully while you are receiving this medicine. You may need blood work done while you are taking this medicine. Do not become pregnant while taking this medicine or for 4 months after stopping it. Women should inform their doctor if they wish to become pregnant or think they might be pregnant. There is a potential for serious side effects to an unborn child. Talk to your health care professional or pharmacist for more information. Do not breast-feed an infant while taking this medicine or for 4 months after the last dose. What side effects may I notice from receiving this medication? Side effects that you should report to your doctor or health care professional as soon as possible: allergic reactions like skin rash, itching or hives, swelling of the face, lips, or tongue bloody or black, tarry breathing problems changes in vision chest pain chills confusion constipation cough diarrhea dizziness or feeling faint or lightheaded fast or irregular heartbeat fever flushing joint pain low blood counts - this medicine may decrease the number of white blood cells, red blood cells and platelets. You may be at increased risk for infections and bleeding. muscle pain muscle weakness pain, tingling, numbness in the hands or feet persistent headache redness, blistering, peeling or loosening of the skin, including inside the mouth signs and symptoms of high blood sugar such as dizziness; dry mouth; dry skin; fruity breath; nausea; stomach pain; increased hunger or thirst; increased urination signs and symptoms of kidney injury like trouble  passing urine or change in the amount of urine signs and symptoms of liver injury like dark urine, light-colored stools, loss of appetite, nausea, right upper belly pain,  yellowing of the eyes or skin sweating swollen lymph nodes weight loss Side effects that usually do not require medical attention (report to your doctor or health care professional if they continue or are bothersome): decreased appetite hair loss tiredness This list may not describe all possible side effects. Call your doctor for medical advice about side effects. You may report side effects to FDA at 1-800-FDA-1088. Where should I keep my medication? This drug is given in a hospital or clinic and will not be stored at home. NOTE: This sheet is a summary. It may not cover all possible information. If you have questions about this medicine, talk to your doctor, pharmacist, or health care provider.  2023 Elsevier/Gold Standard (2020-12-10 00:00:00)

## 2021-08-26 DIAGNOSIS — J4540 Moderate persistent asthma, uncomplicated: Secondary | ICD-10-CM | POA: Diagnosis not present

## 2021-09-04 DIAGNOSIS — J4520 Mild intermittent asthma, uncomplicated: Secondary | ICD-10-CM | POA: Diagnosis not present

## 2021-09-21 ENCOUNTER — Other Ambulatory Visit: Payer: Self-pay | Admitting: Oncology

## 2021-09-21 DIAGNOSIS — C3481 Malignant neoplasm of overlapping sites of right bronchus and lung: Secondary | ICD-10-CM

## 2021-09-22 ENCOUNTER — Other Ambulatory Visit: Payer: Self-pay

## 2021-09-25 ENCOUNTER — Other Ambulatory Visit: Payer: Self-pay | Admitting: Pharmacist

## 2021-09-25 DIAGNOSIS — J069 Acute upper respiratory infection, unspecified: Secondary | ICD-10-CM | POA: Diagnosis not present

## 2021-09-25 DIAGNOSIS — R0789 Other chest pain: Secondary | ICD-10-CM | POA: Diagnosis not present

## 2021-09-25 DIAGNOSIS — Z20822 Contact with and (suspected) exposure to covid-19: Secondary | ICD-10-CM | POA: Diagnosis not present

## 2021-09-25 DIAGNOSIS — J189 Pneumonia, unspecified organism: Secondary | ICD-10-CM | POA: Diagnosis not present

## 2021-09-25 DIAGNOSIS — C3481 Malignant neoplasm of overlapping sites of right bronchus and lung: Secondary | ICD-10-CM

## 2021-09-26 DIAGNOSIS — J4540 Moderate persistent asthma, uncomplicated: Secondary | ICD-10-CM | POA: Diagnosis not present

## 2021-09-29 ENCOUNTER — Other Ambulatory Visit: Payer: Self-pay

## 2021-09-29 DIAGNOSIS — C3481 Malignant neoplasm of overlapping sites of right bronchus and lung: Secondary | ICD-10-CM

## 2021-09-29 DIAGNOSIS — S129XXS Fracture of neck, unspecified, sequela: Secondary | ICD-10-CM

## 2021-09-29 DIAGNOSIS — M5030 Other cervical disc degeneration, unspecified cervical region: Secondary | ICD-10-CM

## 2021-09-29 DIAGNOSIS — R0789 Other chest pain: Secondary | ICD-10-CM

## 2021-09-29 MED ORDER — HYDROCODONE-ACETAMINOPHEN 7.5-325 MG PO TABS: 1.0000 | ORAL_TABLET | Freq: Four times a day (QID) | ORAL | 0 refills | Status: DC | PRN

## 2021-10-02 NOTE — Progress Notes (Signed)
[facility]  [address] [place],  [place]  [address] [phone]  Clinic Day:  10/03/2021  Referring physician: Janine Limbo, PA-C   HISTORY OF PRESENT ILLNESS:  The patient is a 56 y.o. female with stage IVA lung adenocarcinoma.  This staging is based upon initial scans showing bilateral lung involvement with her cancer.  She comes in today to be evaluated before heading into her 26th cycle of maintenance pembrolizumab immunotherapy, which she receives every 6 weeks.  Since her last visit, the patient has been doing well.  She denies any adverse effects from her immunotherapy.  From a lung cancer standpoint, she denies having any new respiratory symptoms which concern her for overt signs of disease progression.  Of note, she continues to work on a daily basis.   PHYSICAL EXAM:  Blood pressure (!) 161/70, pulse 76, temperature 97.9 F (36.6 C), resp. rate 14, height 5\' 5"  (1.651 m), weight 104 lb 14.4 oz (47.6 kg), SpO2 97 %. Wt Readings from Last 3 Encounters:  10/03/21 104 lb 14.4 oz (47.6 kg)  08/24/21 107 lb (48.5 kg)  08/23/21 106 lb (48.1 kg)   Body mass index is 17.46 kg/m.  Performance status (ECOG): 0 - Asymptomatic  Physical Exam Vitals and nursing note reviewed.  Constitutional:      General: She is not in acute distress.    Appearance: Normal appearance.  HENT:     Head: Normocephalic and atraumatic.     Mouth/Throat:     Mouth: Mucous membranes are moist.     Pharynx: Oropharynx is clear. No oropharyngeal exudate or posterior oropharyngeal erythema.  Eyes:     General: No scleral icterus.    Extraocular Movements: Extraocular movements intact.     Conjunctiva/sclera: Conjunctivae normal.     Pupils: Pupils are equal, round, and reactive to light.  Cardiovascular:     Rate and Rhythm: Normal rate and regular rhythm.     Heart sounds: Normal heart sounds. No murmur heard.    No friction rub. No gallop.  Pulmonary:      Effort: Pulmonary effort is normal.     Breath sounds: Normal breath sounds. No wheezing, rhonchi or rales.  Abdominal:     General: There is no distension.     Palpations: Abdomen is soft. There is no hepatomegaly, splenomegaly or mass.     Tenderness: There is no abdominal tenderness.  Musculoskeletal:        General: Normal range of motion.     Cervical back: Normal range of motion and neck supple. No tenderness.     Right lower leg: No edema.     Left lower leg: No edema.  Lymphadenopathy:     Cervical: No cervical adenopathy.     Upper Body:     Right upper body: No supraclavicular or axillary adenopathy.     Left upper body: No supraclavicular or axillary adenopathy.     Lower Body: No right inguinal adenopathy. No left inguinal adenopathy.  Skin:    General: Skin is warm and dry.     Coloration: Skin is not jaundiced.     Findings: No rash.  Neurological:     Mental Status: She is alert and oriented to person, place, and time.     Cranial Nerves: No cranial nerve deficit.  Psychiatric:        Mood and Affect: Mood normal.        Behavior: Behavior normal.  Thought Content: Thought content normal.    LABS:     ASSESSMENT & PLAN:  Assessment/Plan:  A 56 y.o. female with stage IVA lung adenocarcinoma. She will proceed with her 26th cycle of pembrolizumab this week.  She will continue receiving her immunotherapy every 6 weeks. Clinically, she appears to be doing very well.  I will see her back in 6 weeks before she heads into her 2t7h cycle of maintenance pembrolizumab immunotherapy.  The patient understands all the plans discussed today and is in agreement with them.   Ivah Girardot Macarthur Critchley, MD

## 2021-10-03 ENCOUNTER — Other Ambulatory Visit: Payer: Self-pay | Admitting: Oncology

## 2021-10-03 ENCOUNTER — Inpatient Hospital Stay: Payer: Federal, State, Local not specified - PPO | Attending: Hematology and Oncology

## 2021-10-03 ENCOUNTER — Inpatient Hospital Stay (INDEPENDENT_AMBULATORY_CARE_PROVIDER_SITE_OTHER): Payer: Federal, State, Local not specified - PPO | Admitting: Oncology

## 2021-10-03 DIAGNOSIS — Z79899 Other long term (current) drug therapy: Secondary | ICD-10-CM | POA: Insufficient documentation

## 2021-10-03 DIAGNOSIS — Z5112 Encounter for antineoplastic immunotherapy: Secondary | ICD-10-CM | POA: Insufficient documentation

## 2021-10-03 DIAGNOSIS — C3432 Malignant neoplasm of lower lobe, left bronchus or lung: Secondary | ICD-10-CM | POA: Insufficient documentation

## 2021-10-03 DIAGNOSIS — C3481 Malignant neoplasm of overlapping sites of right bronchus and lung: Secondary | ICD-10-CM

## 2021-10-03 LAB — BASIC METABOLIC PANEL
BUN: 8 (ref 4–21)
CO2: 27 — AB (ref 13–22)
Chloride: 103 (ref 99–108)
Creatinine: 0.4 — AB (ref 0.5–1.1)
Glucose: 77
Potassium: 3.7 mEq/L (ref 3.5–5.1)
Sodium: 138 (ref 137–147)

## 2021-10-03 LAB — HEPATIC FUNCTION PANEL
ALT: 17 U/L (ref 7–35)
AST: 31 (ref 13–35)
Alkaline Phosphatase: 72 (ref 25–125)
Bilirubin, Total: 0.5

## 2021-10-03 LAB — T4, FREE: Free T4: 0.95 ng/dL (ref 0.61–1.12)

## 2021-10-03 LAB — CBC AND DIFFERENTIAL
HCT: 41 (ref 36–46)
Hemoglobin: 13.7 (ref 12.0–16.0)
Neutrophils Absolute: 9.16
Platelets: 511 10*3/uL — AB (ref 150–400)
WBC: 11.9

## 2021-10-03 LAB — COMPREHENSIVE METABOLIC PANEL
Albumin: 3.9 (ref 3.5–5.0)
Calcium: 9.3 (ref 8.7–10.7)

## 2021-10-03 LAB — TSH: TSH: 2.637 u[IU]/mL (ref 0.350–4.500)

## 2021-10-03 LAB — CBC: RBC: 4.43 (ref 3.87–5.11)

## 2021-10-04 ENCOUNTER — Ambulatory Visit: Payer: Federal, State, Local not specified - PPO | Admitting: Oncology

## 2021-10-04 ENCOUNTER — Other Ambulatory Visit: Payer: Federal, State, Local not specified - PPO

## 2021-10-04 ENCOUNTER — Other Ambulatory Visit: Payer: Self-pay | Admitting: Oncology

## 2021-10-04 DIAGNOSIS — C3481 Malignant neoplasm of overlapping sites of right bronchus and lung: Secondary | ICD-10-CM

## 2021-10-04 MED FILL — Pembrolizumab IV Soln 100 MG/4ML (25 MG/ML): INTRAVENOUS | Qty: 8 | Status: AC

## 2021-10-05 ENCOUNTER — Inpatient Hospital Stay: Payer: Federal, State, Local not specified - PPO

## 2021-10-05 VITALS — BP 117/67 | HR 74 | Temp 98.0°F | Resp 18 | Ht 65.0 in | Wt 104.0 lb

## 2021-10-05 DIAGNOSIS — Z79899 Other long term (current) drug therapy: Secondary | ICD-10-CM | POA: Diagnosis not present

## 2021-10-05 DIAGNOSIS — C3481 Malignant neoplasm of overlapping sites of right bronchus and lung: Secondary | ICD-10-CM | POA: Diagnosis not present

## 2021-10-05 DIAGNOSIS — C3432 Malignant neoplasm of lower lobe, left bronchus or lung: Secondary | ICD-10-CM | POA: Diagnosis not present

## 2021-10-05 DIAGNOSIS — J4520 Mild intermittent asthma, uncomplicated: Secondary | ICD-10-CM | POA: Diagnosis not present

## 2021-10-05 DIAGNOSIS — Z5112 Encounter for antineoplastic immunotherapy: Secondary | ICD-10-CM | POA: Diagnosis not present

## 2021-10-05 MED ORDER — SODIUM CHLORIDE 0.9 % IV SOLN
200.0000 mg | Freq: Once | INTRAVENOUS | Status: AC
  Administered 2021-10-05: 200 mg via INTRAVENOUS
  Filled 2021-10-05: qty 8

## 2021-10-05 MED ORDER — SODIUM CHLORIDE 0.9 % IV SOLN: Freq: Once | INTRAVENOUS | Status: AC

## 2021-10-05 MED ORDER — SODIUM CHLORIDE 0.9% FLUSH
10.0000 mL | INTRAVENOUS | Status: DC | PRN
  Administered 2021-10-05: 10 mL

## 2021-10-05 MED ORDER — HEPARIN SOD (PORK) LOCK FLUSH 100 UNIT/ML IV SOLN
500.0000 [IU] | Freq: Once | INTRAVENOUS | Status: AC | PRN
  Administered 2021-10-05: 500 [IU]

## 2021-10-05 NOTE — Patient Instructions (Signed)

## 2021-10-25 ENCOUNTER — Other Ambulatory Visit: Payer: Federal, State, Local not specified - PPO

## 2021-10-26 DIAGNOSIS — J4540 Moderate persistent asthma, uncomplicated: Secondary | ICD-10-CM | POA: Diagnosis not present

## 2021-10-27 ENCOUNTER — Ambulatory Visit: Payer: Federal, State, Local not specified - PPO

## 2021-11-04 DIAGNOSIS — J4520 Mild intermittent asthma, uncomplicated: Secondary | ICD-10-CM | POA: Diagnosis not present

## 2021-11-13 NOTE — Progress Notes (Signed)
[facility]  [address] [place],  [place]  [address] [phone]  Clinic Day:  11/14/2021  Referring physician: Eunice Blase, PA-C   HISTORY OF PRESENT ILLNESS:  The patient is a 56 y.o. female with stage IVA lung adenocarcinoma.  This staging is based upon initial scans showing bilateral lung involvement with her cancer.  She comes in today to be evaluated before heading into her 27th cycle of maintenance pembrolizumab immunotherapy, which she receives every 6 weeks.  Since her last visit, the patient has been doing well.  She denies having any adverse effects from her immunotherapy.  From a lung cancer standpoint, she denies having any new respiratory symptoms which concern her for overt signs of disease progression.  Of note, she continues to work on a daily basis.   PHYSICAL EXAM:  Blood pressure 133/72, pulse 73, temperature 98.4 F (36.9 C), resp. rate 14, height 5\' 5"  (1.651 m), weight 105 lb 8 oz (47.9 kg), SpO2 98 %. Wt Readings from Last 3 Encounters:  11/14/21 105 lb 8 oz (47.9 kg)  10/05/21 104 lb (47.2 kg)  10/03/21 104 lb 14.4 oz (47.6 kg)   Body mass index is 17.56 kg/m.  Performance status (ECOG): 0 - Asymptomatic  Physical Exam Vitals and nursing note reviewed.  Constitutional:      General: She is not in acute distress.    Appearance: Normal appearance.  HENT:     Head: Normocephalic and atraumatic.     Mouth/Throat:     Mouth: Mucous membranes are moist.     Pharynx: Oropharynx is clear. No oropharyngeal exudate or posterior oropharyngeal erythema.  Eyes:     General: No scleral icterus.    Extraocular Movements: Extraocular movements intact.     Conjunctiva/sclera: Conjunctivae normal.     Pupils: Pupils are equal, round, and reactive to light.  Cardiovascular:     Rate and Rhythm: Normal rate and regular rhythm.     Heart sounds: Normal heart sounds. No murmur heard.    No friction rub. No gallop.  Pulmonary:      Effort: Pulmonary effort is normal.     Breath sounds: Normal breath sounds. No wheezing, rhonchi or rales.  Abdominal:     General: There is no distension.     Palpations: Abdomen is soft. There is no hepatomegaly, splenomegaly or mass.     Tenderness: There is no abdominal tenderness.  Musculoskeletal:        General: Normal range of motion.     Cervical back: Normal range of motion and neck supple. No tenderness.     Right lower leg: No edema.     Left lower leg: No edema.  Lymphadenopathy:     Cervical: No cervical adenopathy.     Upper Body:     Right upper body: No supraclavicular or axillary adenopathy.     Left upper body: No supraclavicular or axillary adenopathy.     Lower Body: No right inguinal adenopathy. No left inguinal adenopathy.  Skin:    General: Skin is warm and dry.     Coloration: Skin is not jaundiced.     Findings: No rash.  Neurological:     Mental Status: She is alert and oriented to person, place, and time.     Cranial Nerves: No cranial nerve deficit.  Psychiatric:        Mood and Affect: Mood normal.        Behavior: Behavior normal.  Thought Content: Thought content normal.    LABS:    Latest Reference Range & Units 11/14/21 00:00  Sodium 137 - 147  136 ! (E)  Potassium 3.5 - 5.1 mEq/L 3.8 (E)  Chloride 99 - 108  105 (E)  CO2 13 - 22  24 ! (E)  Glucose  74 (E)  BUN 4 - 21  9 (E)  Creatinine 0.5 - 1.1  0.6 (E)  Calcium 8.7 - 10.7  9.0 (E)  Alkaline Phosphatase 25 - 125  55 (E)  Albumin 3.5 - 5.0  4.3 (E)  AST 13 - 35  28 (E)  ALT 7 - 35 U/L 21 (E)  Bilirubin, Total  0.8 (E)  WBC  7.3 (E)  RBC 3.87 - 5.11  4.38 (E)  Hemoglobin 12.0 - 16.0  14.0 (E)  HCT 36 - 46  41 (E)  Platelets 150 - 400 K/uL 296 (E)  NEUT#  5.04 (E)  !: Data is abnormal (E): External lab result  ASSESSMENT & PLAN:  Assessment/Plan:  A 56 y.o. female with stage IVA lung adenocarcinoma. She will proceed with her 27th cycle of pembrolizumab this week.  She  will continue receiving her immunotherapy every 6 weeks. Clinically, she appears to be doing very well.  I will see her back in 6 weeks before she heads into her 28th cycle of maintenance pembrolizumab immunotherapy.  The patient understands all the plans discussed today and is in agreement with them.   Laiana Fratus Kirby Funk, MD

## 2021-11-14 ENCOUNTER — Inpatient Hospital Stay (INDEPENDENT_AMBULATORY_CARE_PROVIDER_SITE_OTHER): Payer: Federal, State, Local not specified - PPO | Admitting: Oncology

## 2021-11-14 ENCOUNTER — Inpatient Hospital Stay: Payer: Federal, State, Local not specified - PPO | Attending: Hematology and Oncology

## 2021-11-14 VITALS — BP 133/72 | HR 73 | Temp 98.4°F | Resp 14 | Ht 65.0 in | Wt 105.5 lb

## 2021-11-14 DIAGNOSIS — Z5112 Encounter for antineoplastic immunotherapy: Secondary | ICD-10-CM | POA: Insufficient documentation

## 2021-11-14 DIAGNOSIS — C3481 Malignant neoplasm of overlapping sites of right bronchus and lung: Secondary | ICD-10-CM | POA: Diagnosis not present

## 2021-11-14 DIAGNOSIS — Z79899 Other long term (current) drug therapy: Secondary | ICD-10-CM | POA: Insufficient documentation

## 2021-11-14 DIAGNOSIS — C3432 Malignant neoplasm of lower lobe, left bronchus or lung: Secondary | ICD-10-CM | POA: Diagnosis not present

## 2021-11-14 LAB — BASIC METABOLIC PANEL
BUN: 9 (ref 4–21)
CO2: 24 — AB (ref 13–22)
Chloride: 105 (ref 99–108)
Creatinine: 0.6 (ref 0.5–1.1)
Glucose: 74
Potassium: 3.8 mEq/L (ref 3.5–5.1)
Sodium: 136 — AB (ref 137–147)

## 2021-11-14 LAB — COMPREHENSIVE METABOLIC PANEL
Albumin: 4.3 (ref 3.5–5.0)
Calcium: 9 (ref 8.7–10.7)

## 2021-11-14 LAB — HEPATIC FUNCTION PANEL
ALT: 21 U/L (ref 7–35)
AST: 28 (ref 13–35)
Alkaline Phosphatase: 55 (ref 25–125)
Bilirubin, Total: 0.8

## 2021-11-14 LAB — CBC AND DIFFERENTIAL
HCT: 41 (ref 36–46)
Hemoglobin: 14 (ref 12.0–16.0)
Neutrophils Absolute: 5.04
Platelets: 296 10*3/uL (ref 150–400)
WBC: 7.3

## 2021-11-14 LAB — CBC: RBC: 4.38 (ref 3.87–5.11)

## 2021-11-14 LAB — TSH: TSH: 2.695 u[IU]/mL (ref 0.350–4.500)

## 2021-11-15 LAB — T4: T4, Total: 7.2 ug/dL (ref 4.5–12.0)

## 2021-11-15 MED FILL — Pembrolizumab IV Soln 100 MG/4ML (25 MG/ML): INTRAVENOUS | Qty: 8 | Status: AC

## 2021-11-16 ENCOUNTER — Ambulatory Visit: Payer: Federal, State, Local not specified - PPO

## 2021-11-16 ENCOUNTER — Inpatient Hospital Stay: Payer: Federal, State, Local not specified - PPO

## 2021-11-16 VITALS — BP 112/75 | HR 69 | Temp 98.5°F | Resp 18 | Ht 65.0 in | Wt 108.0 lb

## 2021-11-16 DIAGNOSIS — Z79899 Other long term (current) drug therapy: Secondary | ICD-10-CM | POA: Diagnosis not present

## 2021-11-16 DIAGNOSIS — C3432 Malignant neoplasm of lower lobe, left bronchus or lung: Secondary | ICD-10-CM | POA: Diagnosis not present

## 2021-11-16 DIAGNOSIS — Z5112 Encounter for antineoplastic immunotherapy: Secondary | ICD-10-CM | POA: Diagnosis not present

## 2021-11-16 DIAGNOSIS — C3481 Malignant neoplasm of overlapping sites of right bronchus and lung: Secondary | ICD-10-CM | POA: Diagnosis not present

## 2021-11-16 MED ORDER — SODIUM CHLORIDE 0.9 % IV SOLN: Freq: Once | INTRAVENOUS | Status: AC

## 2021-11-16 MED ORDER — HEPARIN SOD (PORK) LOCK FLUSH 100 UNIT/ML IV SOLN
500.0000 [IU] | Freq: Once | INTRAVENOUS | Status: AC | PRN
  Administered 2021-11-16: 500 [IU]

## 2021-11-16 MED ORDER — SODIUM CHLORIDE 0.9% FLUSH
10.0000 mL | INTRAVENOUS | Status: DC | PRN
  Administered 2021-11-16: 10 mL

## 2021-11-16 MED ORDER — SODIUM CHLORIDE 0.9 % IV SOLN
200.0000 mg | Freq: Once | INTRAVENOUS | Status: AC
  Administered 2021-11-16: 200 mg via INTRAVENOUS
  Filled 2021-11-16: qty 8

## 2021-11-16 NOTE — Patient Instructions (Signed)
Pembrolizumab Injection What is this medication? PEMBROLIZUMAB (PEM broe LIZ ue mab) treats some types of cancer. It works by helping your immune system slow or stop the spread of cancer cells. It is a monoclonal antibody. This medicine may be used for other purposes; ask your health care provider or pharmacist if you have questions. COMMON BRAND NAME(S): Keytruda What should I tell my care team before I take this medication? They need to know if you have any of these conditions: Allogeneic stem cell transplant (uses someone else's stem cells) Autoimmune diseases, such as Crohn disease, ulcerative colitis, lupus History of chest radiation Nervous system problems, such as Guillain-Barre syndrome, myasthenia gravis Organ transplant An unusual or allergic reaction to pembrolizumab, other medications, foods, dyes, or preservatives Pregnant or trying to get pregnant Breast-feeding How should I use this medication? This medication is injected into a vein. It is given by your care team in a hospital or clinic setting. A special MedGuide will be given to you before each treatment. Be sure to read this information carefully each time. Talk to your care team about the use of this medication in children. While it may be prescribed for children as young as 6 months for selected conditions, precautions do apply. Overdosage: If you think you have taken too much of this medicine contact a poison control center or emergency room at once. NOTE: This medicine is only for you. Do not share this medicine with others. What if I miss a dose? Keep appointments for follow-up doses. It is important not to miss your dose. Call your care team if you are unable to keep an appointment. What may interact with this medication? Interactions have not been studied. This list may not describe all possible interactions. Give your health care provider a list of all the medicines, herbs, non-prescription drugs, or dietary  supplements you use. Also tell them if you smoke, drink alcohol, or use illegal drugs. Some items may interact with your medicine. What should I watch for while using this medication? Your condition will be monitored carefully while you are receiving this medication. You may need blood work while taking this medication. This medication may cause serious skin reactions. They can happen weeks to months after starting the medication. Contact your care team right away if you notice fevers or flu-like symptoms with a rash. The rash may be red or purple and then turn into blisters or peeling of the skin. You may also notice a red rash with swelling of the face, lips, or lymph nodes in your neck or under your arms. Tell your care team right away if you have any change in your eyesight. Talk to your care team if you may be pregnant. Serious birth defects can occur if you take this medication during pregnancy and for 4 months after the last dose. You will need a negative pregnancy test before starting this medication. Contraception is recommended while taking this medication and for 4 months after the last dose. Your care team can help you find the option that works for you. Do not breastfeed while taking this medication and for 4 months after the last dose. What side effects may I notice from receiving this medication? Side effects that you should report to your care team as soon as possible: Allergic reactions--skin rash, itching, hives, swelling of the face, lips, tongue, or throat Dry cough, shortness of breath or trouble breathing Eye pain, redness, irritation, or discharge with blurry or decreased vision Heart muscle inflammation--unusual weakness or  fatigue, shortness of breath, chest pain, fast or irregular heartbeat, dizziness, swelling of the ankles, feet, or hands Hormone gland problems--headache, sensitivity to light, unusual weakness or fatigue, dizziness, fast or irregular heartbeat, increased  sensitivity to cold or heat, excessive sweating, constipation, hair loss, increased thirst or amount of urine, tremors or shaking, irritability Infusion reactions--chest pain, shortness of breath or trouble breathing, feeling faint or lightheaded Kidney injury (glomerulonephritis)--decrease in the amount of urine, red or dark brown urine, foamy or bubbly urine, swelling of the ankles, hands, or feet Liver injury--right upper belly pain, loss of appetite, nausea, light-colored stool, dark yellow or brown urine, yellowing skin or eyes, unusual weakness or fatigue Pain, tingling, or numbness in the hands or feet, muscle weakness, change in vision, confusion or trouble speaking, loss of balance or coordination, trouble walking, seizures Rash, fever, and swollen lymph nodes Redness, blistering, peeling, or loosening of the skin, including inside the mouth Sudden or severe stomach pain, bloody diarrhea, fever, nausea, vomiting Side effects that usually do not require medical attention (report to your care team if they continue or are bothersome): Bone, joint, or muscle pain Diarrhea Fatigue Loss of appetite Nausea Skin rash This list may not describe all possible side effects. Call your doctor for medical advice about side effects. You may report side effects to FDA at 1-800-FDA-1088. Where should I keep my medication? This medication is given in a hospital or clinic. It will not be stored at home. NOTE: This sheet is a summary. It may not cover all possible information. If you have questions about this medicine, talk to your doctor, pharmacist, or health care provider.  2023 Elsevier/Gold Standard (2012-09-30 00:00:00)   [facility]  Discharge Instructions: Thank you for choosing [facility] to provide your oncology and hematology care.  If you have a lab appointment with the [facility], please go directly to the [facility] and check in at the  registration area.   Wear comfortable clothing and clothing appropriate for easy access to any Portacath or PICC line.   We strive to give you quality time with your provider. You may need to reschedule your appointment if you arrive late (15 or more minutes).  Arriving late affects you and other patients whose appointments are after yours.  Also, if you miss three or more appointments without notifying the office, you may be dismissed from the clinic at the provider's discretion.      For prescription refill requests, have your pharmacy contact our office and allow 72 hours for refills to be completed.    Today you received the following chemotherapy and/or immunotherapy agents PEMBROLIZUMAB      To help prevent nausea and vomiting after your treatment, we encourage you to take your nausea medication as directed.  BELOW ARE SYMPTOMS THAT SHOULD BE REPORTED IMMEDIATELY: *FEVER GREATER THAN 100.4 F (38 C) OR HIGHER *CHILLS OR SWEATING *NAUSEA AND VOMITING THAT IS NOT CONTROLLED WITH YOUR NAUSEA MEDICATION *UNUSUAL SHORTNESS OF BREATH *UNUSUAL BRUISING OR BLEEDING *URINARY PROBLEMS (pain or burning when urinating, or frequent urination) *BOWEL PROBLEMS (unusual diarrhea, constipation, pain near the anus) TENDERNESS IN MOUTH AND THROAT WITH OR WITHOUT PRESENCE OF ULCERS (sore throat, sores in mouth, or a toothache) UNUSUAL RASH, SWELLING OR PAIN  UNUSUAL VAGINAL DISCHARGE OR ITCHING   Items with * indicate a potential emergency and should be followed up as soon as possible or go to the Emergency Department if any problems should occur.  Please show the CHEMOTHERAPY ALERT CARD or IMMUNOTHERAPY ALERT CARD at check-in to the Emergency Department and triage nurse.  Should you have questions after your visit or need to cancel or reschedule your appointment, please contact [facility]  Dept: [phone]  and follow the prompts.  Office hours are 8:00 a.m. to 4:30 p.m.  Monday - Friday. Please note that voicemails left after 4:00 p.m. may not be returned until the following business day.  We are closed weekends and major holidays. You have access to a nurse at all times for urgent questions. Please call the main number to the clinic Dept: [phone] and follow the prompts.  For any non-urgent questions, you may also contact your provider using MyChart. We now offer e-Visits for anyone 47 and older to request care online for non-urgent symptoms. For details visit [email or web address].   Also download the MyChart app! Go to the app store, search "MyChart", open the app, select [facility], and log in with your MyChart username and password.  Masks are optional in the cancer centers. If you would like for your care team to wear a mask while they are taking care of you, please let them know. You may have one support person who is at least 58 years old accompany you for your appointments.

## 2021-11-21 ENCOUNTER — Encounter: Payer: Self-pay | Admitting: Oncology

## 2021-11-26 DIAGNOSIS — J4540 Moderate persistent asthma, uncomplicated: Secondary | ICD-10-CM | POA: Diagnosis not present

## 2021-11-30 ENCOUNTER — Encounter: Payer: Self-pay | Admitting: Oncology

## 2021-12-05 DIAGNOSIS — J4520 Mild intermittent asthma, uncomplicated: Secondary | ICD-10-CM | POA: Diagnosis not present

## 2021-12-12 ENCOUNTER — Other Ambulatory Visit: Payer: Self-pay

## 2021-12-12 DIAGNOSIS — R0789 Other chest pain: Secondary | ICD-10-CM

## 2021-12-12 DIAGNOSIS — C3481 Malignant neoplasm of overlapping sites of right bronchus and lung: Secondary | ICD-10-CM

## 2021-12-12 DIAGNOSIS — S129XXS Fracture of neck, unspecified, sequela: Secondary | ICD-10-CM

## 2021-12-12 DIAGNOSIS — M5030 Other cervical disc degeneration, unspecified cervical region: Secondary | ICD-10-CM

## 2021-12-12 MED ORDER — HYDROCODONE-ACETAMINOPHEN 7.5-325 MG PO TABS: 1.0000 | ORAL_TABLET | Freq: Four times a day (QID) | ORAL | 0 refills | Status: DC | PRN

## 2021-12-25 NOTE — Progress Notes (Unsigned)
Heather Gutierrez  [address] [place],  [place]  [address] [phone]  Clinic Day:  12/26/2021  Referring physician: Janine Limbo, PA-C   HISTORY OF PRESENT ILLNESS:  The patient is a 56 y.o. female with stage IVA lung adenocarcinoma.  This staging is based upon initial scans showing bilateral lung involvement with her cancer.  She comes in today to be evaluated before heading into her 28th cycle of maintenance pembrolizumab immunotherapy, which she receives every 6 weeks.  Since her last visit, the patient has been doing well.  She denies having any adverse effects from her immunotherapy.  From a lung cancer standpoint, she denies having any new respiratory symptoms which concern her for overt signs of disease progression.  Of note, she continues to work on a daily basis.   PHYSICAL EXAM:  Blood pressure 108/64, pulse 66, temperature 97.9 F (36.6 C), temperature source Oral, resp. rate 18, height 5\' 5"  (1.651 m), weight 107 lb 3.2 oz (48.6 kg), SpO2 97 %. Wt Readings from Last 3 Encounters:  12/26/21 107 lb 3.2 oz (48.6 kg)  11/16/21 108 lb (49 kg)  11/14/21 105 lb 8 oz (47.9 kg)   Body mass index is 17.84 kg/m.  Performance status (ECOG): 0 - Asymptomatic  Physical Exam Vitals and nursing note reviewed.  Constitutional:      General: She is not in acute distress.    Appearance: Normal appearance.  HENT:     Head: Normocephalic and atraumatic.     Mouth/Throat:     Mouth: Mucous membranes are moist.     Pharynx: Oropharynx is clear. No oropharyngeal exudate or posterior oropharyngeal erythema.  Eyes:     General: No scleral icterus.    Extraocular Movements: Extraocular movements intact.     Conjunctiva/sclera: Conjunctivae normal.     Pupils: Pupils are equal, round, and reactive to light.  Cardiovascular:     Rate and Rhythm: Normal rate and regular rhythm.     Heart sounds: Normal heart sounds. No murmur heard.    No friction rub. No  gallop.  Pulmonary:     Effort: Pulmonary effort is normal.     Breath sounds: Normal breath sounds. No wheezing, rhonchi or rales.  Abdominal:     General: There is no distension.     Palpations: Abdomen is soft. There is no hepatomegaly, splenomegaly or mass.     Tenderness: There is no abdominal tenderness.  Musculoskeletal:        General: Normal range of motion.     Cervical back: Normal range of motion and neck supple. No tenderness.     Right lower leg: No edema.     Left lower leg: No edema.  Lymphadenopathy:     Cervical: No cervical adenopathy.     Upper Body:     Right upper body: No supraclavicular or axillary adenopathy.     Left upper body: No supraclavicular or axillary adenopathy.     Lower Body: No right inguinal adenopathy. No left inguinal adenopathy.  Skin:    General: Skin is warm and dry.     Coloration: Skin is not jaundiced.     Findings: No rash.  Neurological:     Mental Status: She is alert and oriented to person, place, and time.     Cranial Nerves: No cranial nerve deficit.  Psychiatric:        Mood and Affect: Mood normal.        Behavior: Behavior normal.  Thought Content: Thought content normal.    LABS:     ASSESSMENT & PLAN:  Assessment/Plan:  A 56 y.o. female with stage IVA lung adenocarcinoma. She will proceed with her 28th cycle of pembrolizumab this week.  She will continue receiving her immunotherapy every 6 weeks. Clinically, she appears to be doing very well.  I will see her back in 6 weeks before she heads into her 29th cycle of maintenance pembrolizumab immunotherapy.  A chest CT will be done before her next visit to ascertain her new disease baseline after 28 cycles of immunotherapy.  The patient understands all the plans discussed today and is in agreement with them.   Heather Sprecher Macarthur Critchley, MD

## 2021-12-26 ENCOUNTER — Encounter: Payer: Self-pay | Admitting: Oncology

## 2021-12-26 ENCOUNTER — Inpatient Hospital Stay: Payer: Federal, State, Local not specified - PPO | Attending: Hematology and Oncology | Admitting: Oncology

## 2021-12-26 ENCOUNTER — Inpatient Hospital Stay: Payer: Federal, State, Local not specified - PPO

## 2021-12-26 ENCOUNTER — Other Ambulatory Visit: Payer: Self-pay | Admitting: Oncology

## 2021-12-26 DIAGNOSIS — Z5112 Encounter for antineoplastic immunotherapy: Secondary | ICD-10-CM | POA: Diagnosis not present

## 2021-12-26 DIAGNOSIS — C3481 Malignant neoplasm of overlapping sites of right bronchus and lung: Secondary | ICD-10-CM

## 2021-12-26 DIAGNOSIS — C3432 Malignant neoplasm of lower lobe, left bronchus or lung: Secondary | ICD-10-CM | POA: Insufficient documentation

## 2021-12-26 DIAGNOSIS — J4540 Moderate persistent asthma, uncomplicated: Secondary | ICD-10-CM | POA: Diagnosis not present

## 2021-12-26 LAB — CMP (CANCER CENTER ONLY)
ALT: 18 U/L (ref 0–44)
AST: 19 U/L (ref 15–41)
Albumin: 3.6 g/dL (ref 3.5–5.0)
Alkaline Phosphatase: 40 U/L (ref 38–126)
Anion gap: 8 (ref 5–15)
BUN: 10 mg/dL (ref 6–20)
CO2: 25 mmol/L (ref 22–32)
Calcium: 9 mg/dL (ref 8.9–10.3)
Chloride: 104 mmol/L (ref 98–111)
Creatinine: 0.61 mg/dL (ref 0.44–1.00)
GFR, Estimated: >60 mL/min (ref >60)
Glucose, Bld: 78 mg/dL (ref 70–99)
Potassium: 4.3 mmol/L (ref 3.5–5.1)
Sodium: 137 mmol/L (ref 135–145)
Total Bilirubin: 0.4 mg/dL (ref 0.3–1.2)
Total Protein: 6.6 g/dL (ref 6.5–8.1)

## 2021-12-27 MED FILL — Pembrolizumab IV Soln 100 MG/4ML (25 MG/ML): INTRAVENOUS | Qty: 8 | Status: AC

## 2021-12-28 ENCOUNTER — Inpatient Hospital Stay: Payer: Federal, State, Local not specified - PPO

## 2021-12-28 VITALS — BP 136/65 | HR 70 | Temp 98.2°F | Resp 18 | Wt 104.0 lb

## 2021-12-28 DIAGNOSIS — C3481 Malignant neoplasm of overlapping sites of right bronchus and lung: Secondary | ICD-10-CM

## 2021-12-28 DIAGNOSIS — C3432 Malignant neoplasm of lower lobe, left bronchus or lung: Secondary | ICD-10-CM | POA: Diagnosis not present

## 2021-12-28 DIAGNOSIS — Z5112 Encounter for antineoplastic immunotherapy: Secondary | ICD-10-CM | POA: Diagnosis not present

## 2021-12-28 MED ORDER — HEPARIN SOD (PORK) LOCK FLUSH 100 UNIT/ML IV SOLN
500.0000 [IU] | Freq: Once | INTRAVENOUS | Status: AC | PRN
  Administered 2021-12-28: 500 [IU]

## 2021-12-28 MED ORDER — SODIUM CHLORIDE 0.9 % IV SOLN
200.0000 mg | Freq: Once | INTRAVENOUS | Status: AC
  Administered 2021-12-28: 200 mg via INTRAVENOUS
  Filled 2021-12-28: qty 8

## 2021-12-28 MED ORDER — SODIUM CHLORIDE 0.9 % IV SOLN: Freq: Once | INTRAVENOUS | Status: AC

## 2021-12-28 MED ORDER — SODIUM CHLORIDE 0.9% FLUSH
10.0000 mL | INTRAVENOUS | Status: DC | PRN
  Administered 2021-12-28: 10 mL

## 2021-12-28 NOTE — Patient Instructions (Signed)

## 2022-01-04 DIAGNOSIS — J4520 Mild intermittent asthma, uncomplicated: Secondary | ICD-10-CM | POA: Diagnosis not present

## 2022-01-20 ENCOUNTER — Telehealth: Payer: Self-pay | Admitting: Oncology

## 2022-01-20 NOTE — Telephone Encounter (Signed)
Patient has been notified of upcoming CT scan appt and instructions

## 2022-01-24 ENCOUNTER — Other Ambulatory Visit: Payer: Self-pay | Admitting: Oncology

## 2022-01-24 DIAGNOSIS — R0789 Other chest pain: Secondary | ICD-10-CM

## 2022-01-24 DIAGNOSIS — C3481 Malignant neoplasm of overlapping sites of right bronchus and lung: Secondary | ICD-10-CM

## 2022-01-24 DIAGNOSIS — S129XXS Fracture of neck, unspecified, sequela: Secondary | ICD-10-CM

## 2022-01-24 DIAGNOSIS — M5030 Other cervical disc degeneration, unspecified cervical region: Secondary | ICD-10-CM

## 2022-01-24 MED ORDER — HYDROCODONE-ACETAMINOPHEN 7.5-325 MG PO TABS: 1.0000 | ORAL_TABLET | Freq: Four times a day (QID) | ORAL | 0 refills | Status: DC | PRN

## 2022-01-26 DIAGNOSIS — J4540 Moderate persistent asthma, uncomplicated: Secondary | ICD-10-CM | POA: Diagnosis not present

## 2022-02-03 DIAGNOSIS — J432 Centrilobular emphysema: Secondary | ICD-10-CM | POA: Diagnosis not present

## 2022-02-03 DIAGNOSIS — C3481 Malignant neoplasm of overlapping sites of right bronchus and lung: Secondary | ICD-10-CM | POA: Diagnosis not present

## 2022-02-03 DIAGNOSIS — R911 Solitary pulmonary nodule: Secondary | ICD-10-CM | POA: Diagnosis not present

## 2022-02-03 LAB — BASIC METABOLIC PANEL
BUN: 12 (ref 4–21)
CO2: 22 (ref 13–22)
Chloride: 106 (ref 99–108)
Creatinine: 0.5 (ref 0.5–1.1)
Glucose: 98
Potassium: 3.8 mEq/L (ref 3.5–5.1)
Sodium: 136 — AB (ref 137–147)

## 2022-02-03 LAB — CBC AND DIFFERENTIAL
HCT: 43 (ref 36–46)
Hemoglobin: 14.7 (ref 12.0–16.0)
Neutrophils Absolute: 7.25
Platelets: 261 10*3/uL (ref 150–400)
WBC: 9.8

## 2022-02-03 LAB — COMPREHENSIVE METABOLIC PANEL
Albumin: 4.3 (ref 3.5–5.0)
Calcium: 8.9 (ref 8.7–10.7)

## 2022-02-03 LAB — HEPATIC FUNCTION PANEL
ALT: 17 U/L (ref 7–35)
AST: 24 (ref 13–35)
Alkaline Phosphatase: 46 (ref 25–125)
Bilirubin, Total: 0.6

## 2022-02-03 LAB — CBC: RBC: 4.6 (ref 3.87–5.11)

## 2022-02-04 DIAGNOSIS — J4520 Mild intermittent asthma, uncomplicated: Secondary | ICD-10-CM | POA: Diagnosis not present

## 2022-02-05 NOTE — Progress Notes (Signed)
[facility]  [address] [place],  [place]  [address] [phone]  Clinic Day:  02/05/2022  Referring physician: Eunice Blase, PA-C   HISTORY OF PRESENT ILLNESS:  The patient is a 57 y.o. female with stage IVA lung adenocarcinoma.  This staging is based upon initial scans showing bilateral lung involvement with her cancer.  She comes in today to go over her CT scans to ascertain her new disease baseline after 28 cycles of maintenance pembrolizumab immunotherapy, which she receives every 6 weeks.  Since her last visit, the patient has been doing well.  She denies having any adverse effects from her immunotherapy.  From a lung cancer standpoint, she denies having any new respiratory symptoms which concern her for overt signs of disease progression.  Of note, she continues to work on a daily basis.   PHYSICAL EXAM:  There were no vitals taken for this visit. Wt Readings from Last 3 Encounters:  12/28/21 104 lb (47.2 kg)  12/26/21 107 lb 3.2 oz (48.6 kg)  11/16/21 108 lb (49 kg)   There is no height or weight on file to calculate BMI.  Performance status (ECOG): 0 - Asymptomatic  Physical Exam Vitals and nursing note reviewed.  Constitutional:      General: She is not in acute distress.    Appearance: Normal appearance.  HENT:     Head: Normocephalic and atraumatic.     Mouth/Throat:     Mouth: Mucous membranes are moist.     Pharynx: Oropharynx is clear. No oropharyngeal exudate or posterior oropharyngeal erythema.  Eyes:     General: No scleral icterus.    Extraocular Movements: Extraocular movements intact.     Conjunctiva/sclera: Conjunctivae normal.     Pupils: Pupils are equal, round, and reactive to light.  Cardiovascular:     Rate and Rhythm: Normal rate and regular rhythm.     Heart sounds: Normal heart sounds. No murmur heard.    No friction rub. No gallop.  Pulmonary:     Effort: Pulmonary effort is normal.     Breath sounds:  Normal breath sounds. No wheezing, rhonchi or rales.  Abdominal:     General: There is no distension.     Palpations: Abdomen is soft. There is no hepatomegaly, splenomegaly or mass.     Tenderness: There is no abdominal tenderness.  Musculoskeletal:        General: Normal range of motion.     Cervical back: Normal range of motion and neck supple. No tenderness.     Right lower leg: No edema.     Left lower leg: No edema.  Lymphadenopathy:     Cervical: No cervical adenopathy.     Upper Body:     Right upper body: No supraclavicular or axillary adenopathy.     Left upper body: No supraclavicular or axillary adenopathy.     Lower Body: No right inguinal adenopathy. No left inguinal adenopathy.  Skin:    General: Skin is warm and dry.     Coloration: Skin is not jaundiced.     Findings: No rash.  Neurological:     Mental Status: She is alert and oriented to person, place, and time.     Cranial Nerves: No cranial nerve deficit.  Psychiatric:        Mood and Affect: Mood normal.        Behavior: Behavior normal.        Thought Content: Thought content normal.  SCANS:  Her chest CT revealed the following:  FINDINGS: Cardiovascular: Right upper chest port identified. Heart is nonenlarged. Small pericardial effusion is stable and more along the inferior aspect of the heart. Normal caliber thoracic aorta with scattered vascular calcifications. Coronary artery calcifications are seen. Please correlate for other coronary risk factors. There is significant calcified plaque along the left subclavian artery origin. A high-grade stenosis is possible. 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  Mediastinum/Nodes: Grossly normal caliber thoracic esophagus. Slight wall thickening. The thyroid gland appears somewhat small. There is no specific abnormal lymph node enlargement seen in the axillary region,  hilum. Left paratracheal node is again seen. This is seen just above the carina where previously this measured 10 mm in short axis and longitudinal of 23 mm. Today when measured in the same fashion short axis of 9 mm and longitudinal of 20 mm. Additional nodes just above this are also stable. Mild stranding of the fat of the mediastinum.  Lungs/Pleura: Centrilobular emphysematous lung changes are identified. No consolidation, pneumothorax or effusion. There is opacification along the bronchi of the left lower lobe as seen on axial image 80 of series 301, increased from previous.  There are several tiny nodules scattered in the left lung. Subpleural tiny nodule for example previously measuring 2 mm laterally is stable today on series 301, image 99. Additional focus on image 12 is stable. Other examples left lower lobe include image 70, 86, also stable. Small nodules in the right lower lobe are also seen. This includes a 5 mm nodule on image 102, unchanged. Three and 4 mm nodules right lower lobe on image 94. Additional middle lobe fossa on image 87 is stable measuring 5 mm. No new nodules are identified.  Upper Abdomen: Diffuse thickening of the left adrenal gland greater than right, unchanged from previous. Fatty liver infiltration.  Musculoskeletal: Fixation hardware throughout the lower cervical spine at the edge of the imaging field. There are scattered degenerative changes along the thoracic spine  IMPRESSION: Slight decrease in size in left paratracheal lymph nodes of the mediastinum. No new lymph node enlargement.  Multiple tiny lung nodules identified these are stable. Recommend continued surveillance.  Significant calcified atherosclerotic plaque. 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 Please correlate with  any symptoms.  Chest port  Aortic Atherosclerosis (ICD10-I70.0) and Emphysema (ICD10-J43.9).  LABS:     ASSESSMENT & PLAN:  Assessment/Plan:  A 57 y.o. female with stage IVA lung adenocarcinoma. She will proceed with her 28th cycle of pembrolizumab this week.  She will continue receiving her immunotherapy every 6 weeks. Clinically, she appears to be doing very well.  I will see her back in 6 weeks before she heads into her 29th cycle of maintenance pembrolizumab immunotherapy.  A chest CT will be done before her next visit to ascertain her new disease baseline after 28 cycles of immunotherapy.  The patient understands all the plans discussed today and is in agreement with them.   Analilia Geddis Kirby Funk, MD

## 2022-02-06 ENCOUNTER — Inpatient Hospital Stay: Payer: Federal, State, Local not specified - PPO | Attending: Hematology and Oncology | Admitting: Oncology

## 2022-02-06 ENCOUNTER — Encounter: Payer: Self-pay | Admitting: Oncology

## 2022-02-06 DIAGNOSIS — J449 Chronic obstructive pulmonary disease, unspecified: Secondary | ICD-10-CM | POA: Diagnosis not present

## 2022-02-06 DIAGNOSIS — Z5112 Encounter for antineoplastic immunotherapy: Secondary | ICD-10-CM | POA: Insufficient documentation

## 2022-02-06 DIAGNOSIS — C3490 Malignant neoplasm of unspecified part of unspecified bronchus or lung: Secondary | ICD-10-CM | POA: Diagnosis not present

## 2022-02-06 DIAGNOSIS — E785 Hyperlipidemia, unspecified: Secondary | ICD-10-CM | POA: Diagnosis not present

## 2022-02-06 DIAGNOSIS — C3432 Malignant neoplasm of lower lobe, left bronchus or lung: Secondary | ICD-10-CM | POA: Insufficient documentation

## 2022-02-06 DIAGNOSIS — I10 Essential (primary) hypertension: Secondary | ICD-10-CM | POA: Diagnosis not present

## 2022-02-06 DIAGNOSIS — C3481 Malignant neoplasm of overlapping sites of right bronchus and lung: Secondary | ICD-10-CM | POA: Diagnosis not present

## 2022-02-06 DIAGNOSIS — Z79899 Other long term (current) drug therapy: Secondary | ICD-10-CM | POA: Diagnosis not present

## 2022-02-07 DIAGNOSIS — J189 Pneumonia, unspecified organism: Secondary | ICD-10-CM | POA: Diagnosis not present

## 2022-02-07 DIAGNOSIS — I10 Essential (primary) hypertension: Secondary | ICD-10-CM | POA: Diagnosis not present

## 2022-02-07 DIAGNOSIS — Z20822 Contact with and (suspected) exposure to covid-19: Secondary | ICD-10-CM | POA: Diagnosis not present

## 2022-02-07 MED FILL — Pembrolizumab IV Soln 100 MG/4ML (25 MG/ML): INTRAVENOUS | Qty: 8 | Status: AC

## 2022-02-08 ENCOUNTER — Inpatient Hospital Stay: Payer: Federal, State, Local not specified - PPO

## 2022-02-08 VITALS — BP 100/65 | HR 82 | Temp 98.2°F | Resp 18 | Ht 65.0 in | Wt 105.0 lb

## 2022-02-08 DIAGNOSIS — C3481 Malignant neoplasm of overlapping sites of right bronchus and lung: Secondary | ICD-10-CM | POA: Diagnosis not present

## 2022-02-08 DIAGNOSIS — Z5112 Encounter for antineoplastic immunotherapy: Secondary | ICD-10-CM | POA: Diagnosis not present

## 2022-02-08 DIAGNOSIS — C3432 Malignant neoplasm of lower lobe, left bronchus or lung: Secondary | ICD-10-CM | POA: Diagnosis not present

## 2022-02-08 MED ORDER — SODIUM CHLORIDE 0.9% FLUSH
10.0000 mL | INTRAVENOUS | Status: DC | PRN
  Administered 2022-02-08: 10 mL

## 2022-02-08 MED ORDER — HEPARIN SOD (PORK) LOCK FLUSH 100 UNIT/ML IV SOLN
500.0000 [IU] | Freq: Once | INTRAVENOUS | Status: AC | PRN
  Administered 2022-02-08: 500 [IU]

## 2022-02-08 MED ORDER — SODIUM CHLORIDE 0.9 % IV SOLN
200.0000 mg | Freq: Once | INTRAVENOUS | Status: AC
  Administered 2022-02-08: 200 mg via INTRAVENOUS
  Filled 2022-02-08: qty 8

## 2022-02-08 MED ORDER — SODIUM CHLORIDE 0.9 % IV SOLN: Freq: Once | INTRAVENOUS | Status: AC

## 2022-02-08 NOTE — Patient Instructions (Signed)
Pembrolizumab Injection What is this medication? PEMBROLIZUMAB (PEM broe LIZ ue mab) treats some types of cancer. It works by helping your immune system slow or stop the spread of cancer cells. It is a monoclonal antibody. This medicine may be used for other purposes; ask your health care provider or pharmacist if you have questions. COMMON BRAND NAME(S): Keytruda What should I tell my care team before I take this medication? They need to know if you have any of these conditions: Allogeneic stem cell transplant (uses someone else's stem cells) Autoimmune diseases, such as Crohn disease, ulcerative colitis, lupus History of chest radiation Nervous system problems, such as Guillain-Barre syndrome, myasthenia gravis Organ transplant An unusual or allergic reaction to pembrolizumab, other medications, foods, dyes, or preservatives Pregnant or trying to get pregnant Breast-feeding How should I use this medication? This medication is injected into a vein. It is given by your care team in a hospital or clinic setting. A special MedGuide will be given to you before each treatment. Be sure to read this information carefully each time. Talk to your care team about the use of this medication in children. While it may be prescribed for children as young as 6 months for selected conditions, precautions do apply. Overdosage: If you think you have taken too much of this medicine contact a poison control center or emergency room at once. NOTE: This medicine is only for you. Do not share this medicine with others. What if I miss a dose? Keep appointments for follow-up doses. It is important not to miss your dose. Call your care team if you are unable to keep an appointment. What may interact with this medication? Interactions have not been studied. This list may not describe all possible interactions. Give your health care provider a list of all the medicines, herbs, non-prescription drugs, or dietary  supplements you use. Also tell them if you smoke, drink alcohol, or use illegal drugs. Some items may interact with your medicine. What should I watch for while using this medication? Your condition will be monitored carefully while you are receiving this medication. You may need blood work while taking this medication. This medication may cause serious skin reactions. They can happen weeks to months after starting the medication. Contact your care team right away if you notice fevers or flu-like symptoms with a rash. The rash may be red or purple and then turn into blisters or peeling of the skin. You may also notice a red rash with swelling of the face, lips, or lymph nodes in your neck or under your arms. Tell your care team right away if you have any change in your eyesight. Talk to your care team if you may be pregnant. Serious birth defects can occur if you take this medication during pregnancy and for 4 months after the last dose. You will need a negative pregnancy test before starting this medication. Contraception is recommended while taking this medication and for 4 months after the last dose. Your care team can help you find the option that works for you. Do not breastfeed while taking this medication and for 4 months after the last dose. What side effects may I notice from receiving this medication? Side effects that you should report to your care team as soon as possible: Allergic reactions--skin rash, itching, hives, swelling of the face, lips, tongue, or throat Dry cough, shortness of breath or trouble breathing Eye pain, redness, irritation, or discharge with blurry or decreased vision Heart muscle inflammation--unusual weakness or  fatigue, shortness of breath, chest pain, fast or irregular heartbeat, dizziness, swelling of the ankles, feet, or hands Hormone gland problems--headache, sensitivity to light, unusual weakness or fatigue, dizziness, fast or irregular heartbeat, increased  sensitivity to cold or heat, excessive sweating, constipation, hair loss, increased thirst or amount of urine, tremors or shaking, irritability Infusion reactions--chest pain, shortness of breath or trouble breathing, feeling faint or lightheaded Kidney injury (glomerulonephritis)--decrease in the amount of urine, red or dark brown urine, foamy or bubbly urine, swelling of the ankles, hands, or feet Liver injury--right upper belly pain, loss of appetite, nausea, light-colored stool, dark yellow or brown urine, yellowing skin or eyes, unusual weakness or fatigue Pain, tingling, or numbness in the hands or feet, muscle weakness, change in vision, confusion or trouble speaking, loss of balance or coordination, trouble walking, seizures Rash, fever, and swollen lymph nodes Redness, blistering, peeling, or loosening of the skin, including inside the mouth Sudden or severe stomach pain, bloody diarrhea, fever, nausea, vomiting Side effects that usually do not require medical attention (report to your care team if they continue or are bothersome): Bone, joint, or muscle pain Diarrhea Fatigue Loss of appetite Nausea Skin rash This list may not describe all possible side effects. Call your doctor for medical advice about side effects. You may report side effects to FDA at 1-800-FDA-1088. Where should I keep my medication? This medication is given in a hospital or clinic. It will not be stored at home. NOTE: This sheet is a summary. It may not cover all possible information. If you have questions about this medicine, talk to your doctor, pharmacist, or health care provider.  2023 Elsevier/Gold Standard (2012-09-30 00:00:00)  [facility]  Discharge Instructions: Thank you for choosing [facility] to provide your oncology and hematology care.  If you have a lab appointment with the Cancer Center, please go directly to the Cancer Center and check in at the  registration area.   Wear comfortable clothing and clothing appropriate for easy access to any Portacath or PICC line.   We strive to give you quality time with your provider. You may need to reschedule your appointment if you arrive late (15 or more minutes).  Arriving late affects you and other patients whose appointments are after yours.  Also, if you miss three or more appointments without notifying the office, you may be dismissed from the clinic at the provider's discretion.      For prescription refill requests, have your pharmacy contact our office and allow 72 hours for refills to be completed.    Today you received the following chemotherapy and/or immunotherapy agents pembrolizumab      To help prevent nausea and vomiting after your treatment, we encourage you to take your nausea medication as directed.  BELOW ARE SYMPTOMS THAT SHOULD BE REPORTED IMMEDIATELY: *FEVER GREATER THAN 100.4 F (38 C) OR HIGHER *CHILLS OR SWEATING *NAUSEA AND VOMITING THAT IS NOT CONTROLLED WITH YOUR NAUSEA MEDICATION *UNUSUAL SHORTNESS OF BREATH *UNUSUAL BRUISING OR BLEEDING *URINARY PROBLEMS (pain or burning when urinating, or frequent urination) *BOWEL PROBLEMS (unusual diarrhea, constipation, pain near the anus) TENDERNESS IN MOUTH AND THROAT WITH OR WITHOUT PRESENCE OF ULCERS (sore throat, sores in mouth, or a toothache) UNUSUAL RASH, SWELLING OR PAIN  UNUSUAL VAGINAL DISCHARGE OR ITCHING   Items with * indicate a potential emergency and should be followed up as soon as possible or go to the Emergency Department if any problems should occur.  Please  show the CHEMOTHERAPY ALERT CARD or IMMUNOTHERAPY ALERT CARD at check-in to the Emergency Department and triage nurse.  Should you have questions after your visit or need to cancel or reschedule your appointment, please contact [facility]  Dept: [phone]  and follow the prompts.  Office hours are 8:00 a.m. to 4:30 p.m.  Monday - Friday. Please note that voicemails left after 4:00 p.m. may not be returned until the following business day.  We are closed weekends and major holidays. You have access to a nurse at all times for urgent questions. Please call the main number to the clinic Dept: [phone] and follow the prompts.  For any non-urgent questions, you may also contact your provider using MyChart. We now offer e-Visits for anyone 58 and older to request care online for non-urgent symptoms. For details visit [email or web address].   Also download the MyChart app! Go to the app store, search "MyChart", open the app, select [place], and log in with your MyChart username and password.

## 2022-03-01 ENCOUNTER — Other Ambulatory Visit: Payer: Self-pay

## 2022-03-01 DIAGNOSIS — M5030 Other cervical disc degeneration, unspecified cervical region: Secondary | ICD-10-CM

## 2022-03-01 DIAGNOSIS — R0789 Other chest pain: Secondary | ICD-10-CM

## 2022-03-01 DIAGNOSIS — S129XXS Fracture of neck, unspecified, sequela: Secondary | ICD-10-CM

## 2022-03-01 DIAGNOSIS — C3481 Malignant neoplasm of overlapping sites of right bronchus and lung: Secondary | ICD-10-CM

## 2022-03-03 MED ORDER — HYDROCODONE-ACETAMINOPHEN 7.5-325 MG PO TABS: 1.0000 | ORAL_TABLET | Freq: Four times a day (QID) | ORAL | 0 refills | Status: DC | PRN

## 2022-03-07 ENCOUNTER — Other Ambulatory Visit: Payer: Self-pay | Admitting: Hematology and Oncology

## 2022-03-07 DIAGNOSIS — S129XXS Fracture of neck, unspecified, sequela: Secondary | ICD-10-CM

## 2022-03-07 DIAGNOSIS — J4520 Mild intermittent asthma, uncomplicated: Secondary | ICD-10-CM | POA: Diagnosis not present

## 2022-03-07 DIAGNOSIS — R0789 Other chest pain: Secondary | ICD-10-CM

## 2022-03-07 DIAGNOSIS — C3481 Malignant neoplasm of overlapping sites of right bronchus and lung: Secondary | ICD-10-CM

## 2022-03-07 DIAGNOSIS — M5030 Other cervical disc degeneration, unspecified cervical region: Secondary | ICD-10-CM

## 2022-03-07 MED ORDER — HYDROCODONE-ACETAMINOPHEN 7.5-325 MG PO TABS: 1.0000 | ORAL_TABLET | Freq: Four times a day (QID) | ORAL | 0 refills | Status: DC | PRN

## 2022-03-19 NOTE — Progress Notes (Unsigned)
Heather Gutierrez  [address] [place],  [place]  [address] [phone]  Clinic Day:  03/20/2022  Referring physician: Janine Limbo, PA-C   HISTORY OF PRESENT ILLNESS:  The patient is a 57 y.o. female with stage IVA lung adenocarcinoma.  This staging is based upon initial scans showing bilateral lung involvement with her cancer.  She comes in today to be evaluated before heading into her 30th cycle of maintenance pembrolizumab immunotherapy, which she receives every 6 weeks.  Since her last visit, the patient has been doing well.  She denies having any adverse effects from her immunotherapy. From a lung cancer standpoint, she denies having any new respiratory symptoms which concern her for overt signs of disease progression.  Of note, she continues to work on a daily basis.   PHYSICAL EXAM:  Blood pressure (!) 147/64, pulse 80, temperature 98.5 F (36.9 C), resp. rate 16, height '5\' 5"'$  (1.651 m), weight 106 lb 14.4 oz (48.5 kg), SpO2 96 %. Wt Readings from Last 3 Encounters:  03/20/22 106 lb 14.4 oz (48.5 kg)  02/08/22 105 lb (47.6 kg)  02/06/22 106 lb 14.4 oz (48.5 kg)   Body mass index is 17.79 kg/m.  Performance status (ECOG): 0 - Asymptomatic  Physical Exam Vitals and nursing note reviewed.  Constitutional:      General: She is not in acute distress.    Appearance: Normal appearance.  HENT:     Head: Normocephalic and atraumatic.     Mouth/Throat:     Mouth: Mucous membranes are moist.     Pharynx: Oropharynx is clear. No oropharyngeal exudate or posterior oropharyngeal erythema.  Eyes:     General: No scleral icterus.    Extraocular Movements: Extraocular movements intact.     Conjunctiva/sclera: Conjunctivae normal.     Pupils: Pupils are equal, round, and reactive to light.  Cardiovascular:     Rate and Rhythm: Normal rate and regular rhythm.     Heart sounds: Normal heart sounds. No murmur heard.    No friction rub. No gallop.   Pulmonary:     Effort: Pulmonary effort is normal.     Breath sounds: Normal breath sounds. No wheezing, rhonchi or rales.  Abdominal:     General: There is no distension.     Palpations: Abdomen is soft. There is no hepatomegaly, splenomegaly or mass.     Tenderness: There is no abdominal tenderness.  Musculoskeletal:        General: Normal range of motion.     Cervical back: Normal range of motion and neck supple. No tenderness.     Right lower leg: No edema.     Left lower leg: No edema.  Lymphadenopathy:     Cervical: No cervical adenopathy.     Upper Body:     Right upper body: No supraclavicular or axillary adenopathy.     Left upper body: No supraclavicular or axillary adenopathy.     Lower Body: No right inguinal adenopathy. No left inguinal adenopathy.  Skin:    General: Skin is warm and dry.     Coloration: Skin is not jaundiced.     Findings: No rash.  Neurological:     Mental Status: She is alert and oriented to person, place, and time.     Cranial Nerves: No cranial nerve deficit.  Psychiatric:        Mood and Affect: Mood normal.        Behavior: Behavior normal.  Thought Content: Thought content normal.    LABS:    Latest Reference Range & Units 03/20/22 00:00 03/20/22 09:32  Sodium 135 - 145 mmol/L  136  Potassium 3.5 - 5.1 mmol/L  3.6  Chloride 98 - 111 mmol/L  100  CO2 22 - 32 mmol/L  24  Glucose 70 - 99 mg/dL  87  BUN 6 - 20 mg/dL  6  Creatinine 0.44 - 1.00 mg/dL  0.49  Calcium 8.9 - 10.3 mg/dL  9.0  Anion gap 5 - 15   12  Alkaline Phosphatase 38 - 126 U/L  66  Albumin 3.5 - 5.0 g/dL  3.9  AST 15 - 41 U/L  16  ALT 0 - 44 U/L  11  Total Protein 6.5 - 8.1 g/dL  7.6  Total Bilirubin 0.3 - 1.2 mg/dL  0.7  GFR, Est Non African American >60 mL/min  >60  WBC  10.4 (E)   RBC 3.87 - 5.11  4.18 (E)   Hemoglobin 12.0 - 16.0  13.4 (E)   HCT 36 - 46  39 (E)   Platelets 150 - 400 K/uL 366 (E)   (E): External lab result  Unfortunately imaging  results ASSESSMENT & PLAN:  Assessment/Plan:  A 57 y.o. female with stage IVA lung adenocarcinoma.  She will proceed with her 30th cycle of pembrolizumab this week.  She will continue receiving her immunotherapy every 6 weeks. Clinically, she appears to be doing very well.  I will see her back in 6 weeks before she heads into her 31st cycle of pembrolizumab immunotherapy.  The patient understands all the plans discussed today and is in agreement with them.   Heather Eckstein Macarthur Critchley, MD

## 2022-03-20 ENCOUNTER — Telehealth: Payer: Self-pay | Admitting: Oncology

## 2022-03-20 ENCOUNTER — Inpatient Hospital Stay: Payer: Federal, State, Local not specified - PPO | Admitting: Oncology

## 2022-03-20 ENCOUNTER — Other Ambulatory Visit: Payer: Self-pay | Admitting: Oncology

## 2022-03-20 ENCOUNTER — Inpatient Hospital Stay: Payer: Federal, State, Local not specified - PPO | Attending: Hematology and Oncology

## 2022-03-20 DIAGNOSIS — C3481 Malignant neoplasm of overlapping sites of right bronchus and lung: Secondary | ICD-10-CM | POA: Diagnosis not present

## 2022-03-20 DIAGNOSIS — Z79620 Long term (current) use of immunosuppressive biologic: Secondary | ICD-10-CM | POA: Insufficient documentation

## 2022-03-20 DIAGNOSIS — C3432 Malignant neoplasm of lower lobe, left bronchus or lung: Secondary | ICD-10-CM | POA: Diagnosis not present

## 2022-03-20 DIAGNOSIS — Z5112 Encounter for antineoplastic immunotherapy: Secondary | ICD-10-CM | POA: Insufficient documentation

## 2022-03-20 LAB — CMP (CANCER CENTER ONLY)
ALT: 11 U/L (ref 0–44)
AST: 16 U/L (ref 15–41)
Albumin: 3.9 g/dL (ref 3.5–5.0)
Alkaline Phosphatase: 66 U/L (ref 38–126)
Anion gap: 12 (ref 5–15)
BUN: 6 mg/dL (ref 6–20)
CO2: 24 mmol/L (ref 22–32)
Calcium: 9 mg/dL (ref 8.9–10.3)
Chloride: 100 mmol/L (ref 98–111)
Creatinine: 0.49 mg/dL (ref 0.44–1.00)
GFR, Estimated: >60 mL/min (ref >60)
Glucose, Bld: 87 mg/dL (ref 70–99)
Potassium: 3.6 mmol/L (ref 3.5–5.1)
Sodium: 136 mmol/L (ref 135–145)
Total Bilirubin: 0.7 mg/dL (ref 0.3–1.2)
Total Protein: 7.6 g/dL (ref 6.5–8.1)

## 2022-03-20 LAB — CBC AND DIFFERENTIAL
HCT: 39 (ref 36–46)
Hemoglobin: 13.4 (ref 12.0–16.0)
Neutrophils Absolute: 7.59
Platelets: 366 10*3/uL (ref 150–400)
WBC: 10.4

## 2022-03-20 LAB — TSH: TSH: 2.077 u[IU]/mL (ref 0.350–4.500)

## 2022-03-20 LAB — CBC: RBC: 4.18 (ref 3.87–5.11)

## 2022-03-20 MED ORDER — DOXYCYCLINE HYCLATE 100 MG PO TABS: 100.0000 mg | ORAL_TABLET | Freq: Two times a day (BID) | ORAL | 0 refills | Status: DC

## 2022-03-20 NOTE — Telephone Encounter (Signed)
03/20/22 Next appt scheduled and confirmed with patient

## 2022-03-21 ENCOUNTER — Encounter: Payer: Self-pay | Admitting: Oncology

## 2022-03-21 LAB — T4: T4, Total: 7.8 ug/dL (ref 4.5–12.0)

## 2022-03-21 MED FILL — Pembrolizumab IV Soln 100 MG/4ML (25 MG/ML): INTRAVENOUS | Qty: 8 | Status: AC

## 2022-03-22 ENCOUNTER — Inpatient Hospital Stay: Payer: Federal, State, Local not specified - PPO

## 2022-03-22 VITALS — BP 114/61 | HR 80 | Temp 99.1°F | Resp 20 | Ht 65.0 in | Wt 107.2 lb

## 2022-03-22 DIAGNOSIS — Z5112 Encounter for antineoplastic immunotherapy: Secondary | ICD-10-CM | POA: Diagnosis not present

## 2022-03-22 DIAGNOSIS — C3481 Malignant neoplasm of overlapping sites of right bronchus and lung: Secondary | ICD-10-CM | POA: Diagnosis not present

## 2022-03-22 DIAGNOSIS — Z79620 Long term (current) use of immunosuppressive biologic: Secondary | ICD-10-CM | POA: Diagnosis not present

## 2022-03-22 DIAGNOSIS — C3432 Malignant neoplasm of lower lobe, left bronchus or lung: Secondary | ICD-10-CM | POA: Diagnosis not present

## 2022-03-22 MED ORDER — SODIUM CHLORIDE 0.9% FLUSH
10.0000 mL | INTRAVENOUS | Status: DC | PRN
  Administered 2022-03-22: 10 mL

## 2022-03-22 MED ORDER — SODIUM CHLORIDE 0.9 % IV SOLN: Freq: Once | INTRAVENOUS | Status: AC

## 2022-03-22 MED ORDER — HEPARIN SOD (PORK) LOCK FLUSH 100 UNIT/ML IV SOLN
500.0000 [IU] | Freq: Once | INTRAVENOUS | Status: AC | PRN
  Administered 2022-03-22: 500 [IU]

## 2022-03-22 MED ORDER — SODIUM CHLORIDE 0.9 % IV SOLN
200.0000 mg | Freq: Once | INTRAVENOUS | Status: AC
  Administered 2022-03-22: 200 mg via INTRAVENOUS
  Filled 2022-03-22: qty 8

## 2022-03-22 NOTE — Patient Instructions (Signed)

## 2022-04-05 DIAGNOSIS — J449 Chronic obstructive pulmonary disease, unspecified: Secondary | ICD-10-CM | POA: Diagnosis not present

## 2022-04-05 DIAGNOSIS — L03113 Cellulitis of right upper limb: Secondary | ICD-10-CM | POA: Diagnosis not present

## 2022-04-05 DIAGNOSIS — D849 Immunodeficiency, unspecified: Secondary | ICD-10-CM | POA: Diagnosis not present

## 2022-04-05 DIAGNOSIS — Z8701 Personal history of pneumonia (recurrent): Secondary | ICD-10-CM | POA: Diagnosis not present

## 2022-04-05 DIAGNOSIS — J439 Emphysema, unspecified: Secondary | ICD-10-CM | POA: Diagnosis not present

## 2022-04-05 DIAGNOSIS — L539 Erythematous condition, unspecified: Secondary | ICD-10-CM | POA: Diagnosis not present

## 2022-04-05 DIAGNOSIS — I10 Essential (primary) hypertension: Secondary | ICD-10-CM | POA: Diagnosis not present

## 2022-04-05 DIAGNOSIS — J4520 Mild intermittent asthma, uncomplicated: Secondary | ICD-10-CM | POA: Diagnosis not present

## 2022-04-06 DIAGNOSIS — Z681 Body mass index (BMI) 19 or less, adult: Secondary | ICD-10-CM | POA: Diagnosis not present

## 2022-04-06 DIAGNOSIS — L0390 Cellulitis, unspecified: Secondary | ICD-10-CM | POA: Diagnosis not present

## 2022-04-09 DIAGNOSIS — L03011 Cellulitis of right finger: Secondary | ICD-10-CM | POA: Diagnosis not present

## 2022-04-09 DIAGNOSIS — Z79899 Other long term (current) drug therapy: Secondary | ICD-10-CM | POA: Diagnosis not present

## 2022-04-09 DIAGNOSIS — C3481 Malignant neoplasm of overlapping sites of right bronchus and lung: Secondary | ICD-10-CM | POA: Diagnosis not present

## 2022-04-09 DIAGNOSIS — J439 Emphysema, unspecified: Secondary | ICD-10-CM | POA: Diagnosis not present

## 2022-04-09 DIAGNOSIS — D849 Immunodeficiency, unspecified: Secondary | ICD-10-CM | POA: Diagnosis not present

## 2022-04-09 DIAGNOSIS — Z87891 Personal history of nicotine dependence: Secondary | ICD-10-CM | POA: Diagnosis not present

## 2022-04-09 DIAGNOSIS — C3490 Malignant neoplasm of unspecified part of unspecified bronchus or lung: Secondary | ICD-10-CM | POA: Diagnosis not present

## 2022-04-10 ENCOUNTER — Other Ambulatory Visit: Payer: Self-pay

## 2022-04-10 DIAGNOSIS — M5030 Other cervical disc degeneration, unspecified cervical region: Secondary | ICD-10-CM

## 2022-04-10 DIAGNOSIS — R0789 Other chest pain: Secondary | ICD-10-CM

## 2022-04-10 DIAGNOSIS — S129XXS Fracture of neck, unspecified, sequela: Secondary | ICD-10-CM

## 2022-04-10 DIAGNOSIS — C3481 Malignant neoplasm of overlapping sites of right bronchus and lung: Secondary | ICD-10-CM

## 2022-04-10 DIAGNOSIS — L03011 Cellulitis of right finger: Secondary | ICD-10-CM | POA: Diagnosis not present

## 2022-04-10 DIAGNOSIS — D849 Immunodeficiency, unspecified: Secondary | ICD-10-CM | POA: Diagnosis not present

## 2022-04-11 ENCOUNTER — Encounter: Payer: Self-pay | Admitting: Oncology

## 2022-04-11 MED ORDER — HYDROCODONE-ACETAMINOPHEN 7.5-325 MG PO TABS: 1.0000 | ORAL_TABLET | Freq: Four times a day (QID) | ORAL | 0 refills | Status: DC | PRN

## 2022-04-12 ENCOUNTER — Telehealth: Payer: Self-pay | Admitting: Oncology

## 2022-04-12 NOTE — Telephone Encounter (Signed)
04/12/22 Patient seen in the ER,was told to f/u with Dr Bobby Rumpf

## 2022-04-16 NOTE — Progress Notes (Signed)
[facility]  [address] [place],  [place]  [address] [phone]  Clinic Day:  04/17/2022  Referring physician: Janine Limbo, PA-C   HISTORY OF PRESENT ILLNESS:  The patient is a 57 y.o. female with stage IVA lung adenocarcinoma.  This staging is based upon initial scans showing bilateral lung involvement with her cancer.  She comes in today to be evaluated before heading into her 31st cycle of maintenance pembrolizumab immunotherapy, which she receives every 6 weeks.  Since her last visit, the patient developed severe cellulitis over the dorsal aspect of her right hand.  Over these past weeks, she has been receiving both steroids, as well as oral and IV antibiotics ,to treat this problem.  Although her skin over her dorsal hand looks better, it still is very scaly and scabbed.  She has been told her immunotherapy may also be factoring into her right hand skin problems.  The patient denies having skin rashes elsewhere over her body.  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  As it pertains to her lung cancer, she denies having shortness of breath or other respiratory issues which concern her for overt signs of disease progression.    PHYSICAL EXAM:  Blood pressure (!) 175/77, pulse 77, temperature 98 F (36.7 C), resp. rate 16, height 5\' 5"  (1.651 m), weight 108 lb 12.8 oz (49.4 kg), SpO2 96 %. Wt Readings from Last 3 Encounters:  04/17/22 108 lb 12.8 oz (49.4 kg)  03/22/22 107 lb 3.2 oz (48.6 kg)  03/20/22 106 lb 14.4 oz (48.5 kg)   Body mass index is 18.11 kg/m.  Performance status (ECOG): 0 - Asymptomatic  Physical Exam Vitals and nursing note reviewed.  Constitutional:      General: She is not in acute distress.    Appearance: Normal appearance.  HENT:     Head: Normocephalic and atraumatic.     Mouth/Throat:     Mouth: Mucous membranes are moist.     Pharynx:  Oropharynx is clear. No oropharyngeal exudate or posterior oropharyngeal erythema.  Eyes:     General: No scleral icterus.    Extraocular Movements: Extraocular movements intact.     Conjunctiva/sclera: Conjunctivae normal.     Pupils: Pupils are equal, round, and reactive to light.  Cardiovascular:     Rate and Rhythm: Normal rate and regular rhythm.     Heart sounds: Normal heart sounds. No murmur heard.    No friction rub. No gallop.  Pulmonary:     Effort: Pulmonary effort is normal.     Breath sounds: Normal breath sounds. No wheezing, rhonchi or rales.  Abdominal:     General: There is no distension.     Palpations: Abdomen is soft. There is no hepatomegaly, splenomegaly or mass.     Tenderness: There is no abdominal tenderness.  Musculoskeletal:        General: Normal range of motion.     Cervical back: Normal range of motion and neck supple. No tenderness.     Right lower leg: No edema.     Left lower leg: No edema.  Lymphadenopathy:     Cervical: No cervical adenopathy.     Upper Body:     Right upper body: No supraclavicular or axillary adenopathy.     Left upper body: No supraclavicular or axillary adenopathy.     Lower Body: No right inguinal adenopathy. No  left inguinal adenopathy.  Skin:    General: Skin is warm and dry.     Coloration: Skin is not jaundiced.     Findings: No rash.  Neurological:     Mental Status: She is alert and oriented to person, place, and time.     Cranial Nerves: No cranial nerve deficit.  Psychiatric:        Mood and Affect: Mood normal.        Behavior: Behavior normal.        Thought Content: Thought content normal.    ASSESSMENT & PLAN:  Assessment/Plan:  A 57 y.o. female with stage IVA lung adenocarcinoma.  Although the skin changes over her right hand cannot be fully elucidated, I am concerned some degree of this may be related to her immunotherapy.  Based upon this, her 3st1 cycle will be held until she gets a thorough  dermatologic assessment to determine if her immunotherapy may be factoring into her right hand skin changes.  If not, she will proceed with her 31st cycle in 2 weeks.  Ultimately, this patient may need to see a dermatologist at an academic institution who is familiar with immunotherapy-related skin changes.  I will see her back in 2 weeks for repeat clinical assessment.  The patient understands all the plans discussed today and is in agreement with them.   Mekhi Lascola Macarthur Critchley, MD

## 2022-04-17 ENCOUNTER — Inpatient Hospital Stay: Payer: Federal, State, Local not specified - PPO | Attending: Hematology and Oncology | Admitting: Oncology

## 2022-04-17 ENCOUNTER — Other Ambulatory Visit: Payer: Self-pay | Admitting: Pharmacist

## 2022-04-17 VITALS — BP 175/77 | HR 77 | Temp 98.0°F | Resp 16 | Ht 65.0 in | Wt 108.8 lb

## 2022-04-17 DIAGNOSIS — C3481 Malignant neoplasm of overlapping sites of right bronchus and lung: Secondary | ICD-10-CM

## 2022-04-17 NOTE — Progress Notes (Signed)
Delay pembrolizumab for 3 weeks due to skin changes on her hand per Dr. Bobby Rumpf.  Next dose potentially due on 05/10/22.

## 2022-04-18 DIAGNOSIS — L300 Nummular dermatitis: Secondary | ICD-10-CM | POA: Diagnosis not present

## 2022-04-18 DIAGNOSIS — L570 Actinic keratosis: Secondary | ICD-10-CM | POA: Diagnosis not present

## 2022-04-18 DIAGNOSIS — D485 Neoplasm of uncertain behavior of skin: Secondary | ICD-10-CM | POA: Diagnosis not present

## 2022-04-18 DIAGNOSIS — L299 Pruritus, unspecified: Secondary | ICD-10-CM | POA: Diagnosis not present

## 2022-04-18 DIAGNOSIS — L308 Other specified dermatitis: Secondary | ICD-10-CM | POA: Diagnosis not present

## 2022-04-30 NOTE — Progress Notes (Unsigned)
[facility]  [address] [place],  [place]  [address] [phone]  Clinic Day:  05/01/2022  Referring physician: Eunice Blase, PA-C   HISTORY OF PRESENT ILLNESS:  The patient is a 57 y.o. female with stage IVA lung adenocarcinoma.  This staging is based upon initial scans showing bilateral lung involvement with her cancer.  She comes in today to be evaluated before heading into her 31st cycle of maintenance pembrolizumab immunotherapy, which she receives every 6 weeks.  This cycle was held 2 weeks ago due to her having significant skin changes over her right hand.  Fortunately, these changes have virtually dissipated.  A skin biopsy was done of her hand recently, for which she was told some cancer cells were seen.  However, she was unaware as to the type of skin cancer her biopsy revealed.  As it pertains to her lung cancer, she denies having shortness of breath or other respiratory issues which concern her for overt signs of disease progression.  She is willing to proceed with her 31st cycle of immunotherapy.      PHYSICAL EXAM:  Blood pressure (!) 146/68, pulse 74, temperature 98.3 F (36.8 C), resp. rate 16, height 5\' 5"  (1.651 m), weight 105 lb 1.6 oz (47.7 kg), SpO2 98 %. Wt Readings from Last 3 Encounters:  05/01/22 105 lb 1.6 oz (47.7 kg)  04/17/22 108 lb 12.8 oz (49.4 kg)  03/22/22 107 lb 3.2 oz (48.6 kg)   Body mass index is 17.49 kg/m.  Performance status (ECOG): 0 - Asymptomatic  Physical Exam Vitals and nursing note reviewed.  Constitutional:      General: She is not in acute distress.    Appearance: Normal appearance.  HENT:     Head: Normocephalic and atraumatic.     Mouth/Throat:     Mouth: Mucous membranes are moist.     Pharynx: Oropharynx is clear. No oropharyngeal exudate or posterior oropharyngeal erythema.  Eyes:     General: No scleral icterus.    Extraocular Movements: Extraocular movements intact.      Conjunctiva/sclera: Conjunctivae normal.     Pupils: Pupils are equal, round, and reactive to light.  Cardiovascular:     Rate and Rhythm: Normal rate and regular rhythm.     Heart sounds: Normal heart sounds. No murmur heard.    No friction rub. No gallop.  Pulmonary:     Effort: Pulmonary effort is normal.     Breath sounds: Normal breath sounds. No wheezing, rhonchi or rales.  Abdominal:     General: There is no distension.     Palpations: Abdomen is soft. There is no hepatomegaly, splenomegaly or mass.     Tenderness: There is no abdominal tenderness.  Musculoskeletal:        General: Normal range of motion.     Cervical back: Normal range of motion and neck supple. No tenderness.     Right lower leg: No edema.     Left lower leg: No edema.  Lymphadenopathy:     Cervical: No cervical adenopathy.     Upper Body:     Right upper body: No supraclavicular or axillary adenopathy.     Left upper body: No supraclavicular or axillary adenopathy.     Lower Body: No right inguinal adenopathy. No left inguinal adenopathy.  Skin:    General: Skin is warm and dry.     Coloration: Skin is not jaundiced.     Findings: No rash.  Neurological:  Mental Status: She is alert and oriented to person, place, and time.     Cranial Nerves: No cranial nerve deficit.  Psychiatric:        Mood and Affect: Mood normal.        Behavior: Behavior normal.        Thought Content: Thought content normal.  LABS:  Latest Reference Range & Units 05/01/22 09:46  Sodium 135 - 145 mmol/L 136  Potassium 3.5 - 5.1 mmol/L 3.7  Chloride 98 - 111 mmol/L 101  CO2 22 - 32 mmol/L 25  Glucose 70 - 99 mg/dL 93  BUN 6 - 20 mg/dL 10  Creatinine 6.01 - 0.93 mg/dL 2.35  Calcium 8.9 - 57.3 mg/dL 9.3  Anion gap 5 - 15  10  Alkaline Phosphatase 38 - 126 U/L 36 (L)  Albumin 3.5 - 5.0 g/dL 4.1  AST 15 - 41 U/L 17  ALT 0 - 44 U/L 19  Total Protein 6.5 - 8.1 g/dL 6.6  Total Bilirubin 0.3 - 1.2 mg/dL 0.4  GFR, Est Non  African American >60 mL/min >60  WBC 4.0 - 10.5 K/uL 10.2  RBC 3.87 - 5.11 MIL/uL 3.97  Hemoglobin 12.0 - 15.0 g/dL 22.0  HCT 25.4 - 27.0 % 38.1  MCV 80.0 - 100.0 fL 96.0  MCH 26.0 - 34.0 pg 32.0  MCHC 30.0 - 36.0 g/dL 62.3  RDW 76.2 - 83.1 % 14.6  Platelets 150 - 400 K/uL 280  nRBC 0.0 - 0.2 % 0.0  Neutrophils % 76  Lymphocytes % 12  Monocytes Relative % 8  Eosinophil % 2  Basophil % 1  Immature Granulocytes % 1  NEUT# 1.7 - 7.7 K/uL 7.8 (H)  (L): Data is abnormally low (H): Data is abnormally high  ASSESSMENT & PLAN:  Assessment/Plan:  A 57 y.o. female with stage IVA lung adenocarcinoma.  As the skin of her hand is much better, she will proceed with her 31st cycle of immunotherapy next week.  I will see her back in May 2024 before she heads into her 32nd cycle of pembrolizumab immunotherapy.  The patient understands all the plans discussed today and is in agreement with them.   Avanna Sowder Kirby Funk, MD

## 2022-05-01 ENCOUNTER — Inpatient Hospital Stay: Payer: Federal, State, Local not specified - PPO

## 2022-05-01 ENCOUNTER — Inpatient Hospital Stay: Payer: Federal, State, Local not specified - PPO | Attending: Hematology and Oncology | Admitting: Oncology

## 2022-05-01 VITALS — BP 146/68 | HR 74 | Temp 98.3°F | Resp 16 | Ht 65.0 in | Wt 105.1 lb

## 2022-05-01 DIAGNOSIS — Z79899 Other long term (current) drug therapy: Secondary | ICD-10-CM | POA: Diagnosis not present

## 2022-05-01 DIAGNOSIS — C3481 Malignant neoplasm of overlapping sites of right bronchus and lung: Secondary | ICD-10-CM

## 2022-05-01 DIAGNOSIS — Z681 Body mass index (BMI) 19 or less, adult: Secondary | ICD-10-CM | POA: Diagnosis not present

## 2022-05-01 DIAGNOSIS — M5416 Radiculopathy, lumbar region: Secondary | ICD-10-CM | POA: Diagnosis not present

## 2022-05-01 DIAGNOSIS — I739 Peripheral vascular disease, unspecified: Secondary | ICD-10-CM | POA: Diagnosis not present

## 2022-05-01 DIAGNOSIS — C3432 Malignant neoplasm of lower lobe, left bronchus or lung: Secondary | ICD-10-CM | POA: Diagnosis not present

## 2022-05-01 DIAGNOSIS — M79606 Pain in leg, unspecified: Secondary | ICD-10-CM | POA: Diagnosis not present

## 2022-05-01 LAB — CBC WITH DIFFERENTIAL (CANCER CENTER ONLY)
Abs Immature Granulocytes: 0.09 10*3/uL — ABNORMAL HIGH (ref 0.00–0.07)
Basophils Absolute: 0.1 10*3/uL (ref 0.0–0.1)
Basophils Relative: 1 %
Eosinophils Absolute: 0.2 10*3/uL (ref 0.0–0.5)
Eosinophils Relative: 2 %
HCT: 38.1 % (ref 36.0–46.0)
Hemoglobin: 12.7 g/dL (ref 12.0–15.0)
Immature Granulocytes: 1 %
Lymphocytes Relative: 12 %
Lymphs Abs: 1.3 10*3/uL (ref 0.7–4.0)
MCH: 32 pg (ref 26.0–34.0)
MCHC: 33.3 g/dL (ref 30.0–36.0)
MCV: 96 fL (ref 80.0–100.0)
Monocytes Absolute: 0.8 10*3/uL (ref 0.1–1.0)
Monocytes Relative: 8 %
Neutro Abs: 7.8 10*3/uL — ABNORMAL HIGH (ref 1.7–7.7)
Neutrophils Relative %: 76 %
Platelet Count: 280 10*3/uL (ref 150–400)
RBC: 3.97 MIL/uL (ref 3.87–5.11)
RDW: 14.6 % (ref 11.5–15.5)
WBC Count: 10.2 10*3/uL (ref 4.0–10.5)
nRBC: 0 % (ref 0.0–0.2)

## 2022-05-01 LAB — CMP (CANCER CENTER ONLY)
ALT: 19 U/L (ref 0–44)
AST: 17 U/L (ref 15–41)
Albumin: 4.1 g/dL (ref 3.5–5.0)
Alkaline Phosphatase: 36 U/L — ABNORMAL LOW (ref 38–126)
Anion gap: 10 (ref 5–15)
BUN: 10 mg/dL (ref 6–20)
CO2: 25 mmol/L (ref 22–32)
Calcium: 9.3 mg/dL (ref 8.9–10.3)
Chloride: 101 mmol/L (ref 98–111)
Creatinine: 0.64 mg/dL (ref 0.44–1.00)
GFR, Estimated: >60 mL/min (ref >60)
Glucose, Bld: 93 mg/dL (ref 70–99)
Potassium: 3.7 mmol/L (ref 3.5–5.1)
Sodium: 136 mmol/L (ref 135–145)
Total Bilirubin: 0.4 mg/dL (ref 0.3–1.2)
Total Protein: 6.6 g/dL (ref 6.5–8.1)

## 2022-05-01 LAB — TSH: TSH: 2.049 u[IU]/mL (ref 0.350–4.500)

## 2022-05-02 DIAGNOSIS — L300 Nummular dermatitis: Secondary | ICD-10-CM | POA: Diagnosis not present

## 2022-05-03 ENCOUNTER — Ambulatory Visit: Payer: Federal, State, Local not specified - PPO

## 2022-05-05 ENCOUNTER — Encounter: Payer: Self-pay | Admitting: Oncology

## 2022-05-05 DIAGNOSIS — R6521 Severe sepsis with septic shock: Secondary | ICD-10-CM | POA: Diagnosis not present

## 2022-05-05 DIAGNOSIS — C3490 Malignant neoplasm of unspecified part of unspecified bronchus or lung: Secondary | ICD-10-CM | POA: Diagnosis not present

## 2022-05-05 DIAGNOSIS — R0603 Acute respiratory distress: Secondary | ICD-10-CM | POA: Diagnosis not present

## 2022-05-05 DIAGNOSIS — J440 Chronic obstructive pulmonary disease with acute lower respiratory infection: Secondary | ICD-10-CM | POA: Diagnosis not present

## 2022-05-05 DIAGNOSIS — J449 Chronic obstructive pulmonary disease, unspecified: Secondary | ICD-10-CM | POA: Diagnosis not present

## 2022-05-05 DIAGNOSIS — G4730 Sleep apnea, unspecified: Secondary | ICD-10-CM | POA: Diagnosis not present

## 2022-05-05 DIAGNOSIS — E785 Hyperlipidemia, unspecified: Secondary | ICD-10-CM | POA: Diagnosis not present

## 2022-05-05 DIAGNOSIS — I10 Essential (primary) hypertension: Secondary | ICD-10-CM | POA: Diagnosis not present

## 2022-05-05 DIAGNOSIS — J9601 Acute respiratory failure with hypoxia: Secondary | ICD-10-CM | POA: Diagnosis not present

## 2022-05-05 DIAGNOSIS — A419 Sepsis, unspecified organism: Secondary | ICD-10-CM | POA: Diagnosis not present

## 2022-05-05 DIAGNOSIS — R9431 Abnormal electrocardiogram [ECG] [EKG]: Secondary | ICD-10-CM | POA: Diagnosis not present

## 2022-05-05 DIAGNOSIS — R000 Tachycardia, unspecified: Secondary | ICD-10-CM | POA: Diagnosis not present

## 2022-05-05 DIAGNOSIS — Z1152 Encounter for screening for COVID-19: Secondary | ICD-10-CM | POA: Diagnosis not present

## 2022-05-05 DIAGNOSIS — J439 Emphysema, unspecified: Secondary | ICD-10-CM | POA: Diagnosis not present

## 2022-05-05 DIAGNOSIS — R0902 Hypoxemia: Secondary | ICD-10-CM | POA: Diagnosis not present

## 2022-05-05 DIAGNOSIS — J309 Allergic rhinitis, unspecified: Secondary | ICD-10-CM | POA: Diagnosis not present

## 2022-05-05 DIAGNOSIS — E440 Moderate protein-calorie malnutrition: Secondary | ICD-10-CM | POA: Diagnosis not present

## 2022-05-05 DIAGNOSIS — Z681 Body mass index (BMI) 19 or less, adult: Secondary | ICD-10-CM | POA: Diagnosis not present

## 2022-05-05 DIAGNOSIS — J189 Pneumonia, unspecified organism: Secondary | ICD-10-CM | POA: Diagnosis not present

## 2022-05-05 DIAGNOSIS — G629 Polyneuropathy, unspecified: Secondary | ICD-10-CM | POA: Diagnosis not present

## 2022-05-05 DIAGNOSIS — R0602 Shortness of breath: Secondary | ICD-10-CM | POA: Diagnosis not present

## 2022-05-07 DIAGNOSIS — J189 Pneumonia, unspecified organism: Secondary | ICD-10-CM | POA: Diagnosis not present

## 2022-05-07 DIAGNOSIS — R0602 Shortness of breath: Secondary | ICD-10-CM | POA: Diagnosis not present

## 2022-05-07 DIAGNOSIS — J9811 Atelectasis: Secondary | ICD-10-CM | POA: Diagnosis not present

## 2022-05-07 DIAGNOSIS — J90 Pleural effusion, not elsewhere classified: Secondary | ICD-10-CM | POA: Diagnosis not present

## 2022-05-08 DIAGNOSIS — M79661 Pain in right lower leg: Secondary | ICD-10-CM | POA: Diagnosis not present

## 2022-05-10 ENCOUNTER — Ambulatory Visit: Payer: Federal, State, Local not specified - PPO

## 2022-05-10 ENCOUNTER — Telehealth: Payer: Self-pay | Admitting: Oncology

## 2022-05-10 DIAGNOSIS — A419 Sepsis, unspecified organism: Secondary | ICD-10-CM | POA: Diagnosis not present

## 2022-05-10 DIAGNOSIS — J9601 Acute respiratory failure with hypoxia: Secondary | ICD-10-CM | POA: Diagnosis not present

## 2022-05-10 DIAGNOSIS — J189 Pneumonia, unspecified organism: Secondary | ICD-10-CM | POA: Diagnosis not present

## 2022-05-10 NOTE — Telephone Encounter (Signed)
Patient has been scheduled. Aware of appt date and time   F/U AFTER IP Received: Today Ruthell Rummage, CMA sent to [employer] Scheduling PLEASE CALL PATIENT AND PUT ON LEWIS SCHED FOR WED OF NEXT WEEK.  LABS AND F/U.  THANKS

## 2022-05-16 NOTE — Progress Notes (Unsigned)
[facility]  [address] [place],  [place]  [address] [phone]  Clinic Day:  05/01/2022  Referring physician: Eunice Blase, PA-C   HISTORY OF PRESENT ILLNESS:  The patient is a 57 y.o. female with stage IVA lung adenocarcinoma.  This staging is based upon initial scans showing bilateral lung involvement with her cancer.  She comes in today to be evaluated before heading into her 31st cycle of maintenance pembrolizumab immunotherapy, which she receives every 6 weeks.  This cycle was held yet again as the patient was hospitalized for a week for bilateral pneumonia.    As it pertains to her lung cancer, she denies having shortness of breath or other respiratory issues which concern her for overt signs of disease progression.  She is willing to proceed with her 31st cycle of immunotherapy.      PHYSICAL EXAM:  There were no vitals taken for this visit. Wt Readings from Last 3 Encounters:  05/01/22 105 lb 1.6 oz (47.7 kg)  04/17/22 108 lb 12.8 oz (49.4 kg)  03/22/22 107 lb 3.2 oz (48.6 kg)   There is no height or weight on file to calculate BMI.  Performance status (ECOG): 0 - Asymptomatic  Physical Exam Vitals and nursing note reviewed.  Constitutional:      General: She is not in acute distress.    Appearance: Normal appearance.  HENT:     Head: Normocephalic and atraumatic.     Mouth/Throat:     Mouth: Mucous membranes are moist.     Pharynx: Oropharynx is clear. No oropharyngeal exudate or posterior oropharyngeal erythema.  Eyes:     General: No scleral icterus.    Extraocular Movements: Extraocular movements intact.     Conjunctiva/sclera: Conjunctivae normal.     Pupils: Pupils are equal, round, and reactive to light.  Cardiovascular:     Rate and Rhythm: Normal rate and regular rhythm.     Heart sounds: Normal heart sounds. No murmur heard.    No friction rub. No gallop.  Pulmonary:     Effort: Pulmonary effort is normal.      Breath sounds: Normal breath sounds. No wheezing, rhonchi or rales.  Abdominal:     General: There is no distension.     Palpations: Abdomen is soft. There is no hepatomegaly, splenomegaly or mass.     Tenderness: There is no abdominal tenderness.  Musculoskeletal:        General: Normal range of motion.     Cervical back: Normal range of motion and neck supple. No tenderness.     Right lower leg: No edema.     Left lower leg: No edema.  Lymphadenopathy:     Cervical: No cervical adenopathy.     Upper Body:     Right upper body: No supraclavicular or axillary adenopathy.     Left upper body: No supraclavicular or axillary adenopathy.     Lower Body: No right inguinal adenopathy. No left inguinal adenopathy.  Skin:    General: Skin is warm and dry.     Coloration: Skin is not jaundiced.     Findings: No rash.  Neurological:     Mental Status: She is alert and oriented to person, place, and time.     Cranial Nerves: No cranial nerve deficit.  Psychiatric:        Mood and Affect: Mood normal.        Behavior: Behavior normal.        Thought  Content: Thought content normal.   LABS:  Latest Reference Range & Units 05/01/22 09:46  Sodium 135 - 145 mmol/L 136  Potassium 3.5 - 5.1 mmol/L 3.7  Chloride 98 - 111 mmol/L 101  CO2 22 - 32 mmol/L 25  Glucose 70 - 99 mg/dL 93  BUN 6 - 20 mg/dL 10  Creatinine 6.57 - 8.46 mg/dL 9.62  Calcium 8.9 - 95.2 mg/dL 9.3  Anion gap 5 - 15  10  Alkaline Phosphatase 38 - 126 U/L 36 (L)  Albumin 3.5 - 5.0 g/dL 4.1  AST 15 - 41 U/L 17  ALT 0 - 44 U/L 19  Total Protein 6.5 - 8.1 g/dL 6.6  Total Bilirubin 0.3 - 1.2 mg/dL 0.4  GFR, Est Non African American >60 mL/min >60  WBC 4.0 - 10.5 K/uL 10.2  RBC 3.87 - 5.11 MIL/uL 3.97  Hemoglobin 12.0 - 15.0 g/dL 84.1  HCT 32.4 - 40.1 % 38.1  MCV 80.0 - 100.0 fL 96.0  MCH 26.0 - 34.0 pg 32.0  MCHC 30.0 - 36.0 g/dL 02.7  RDW 25.3 - 66.4 % 14.6  Platelets 150 - 400 K/uL 280  nRBC 0.0 - 0.2 % 0.0   Neutrophils % 76  Lymphocytes % 12  Monocytes Relative % 8  Eosinophil % 2  Basophil % 1  Immature Granulocytes % 1  NEUT# 1.7 - 7.7 K/uL 7.8 (H)  (L): Data is abnormally low (H): Data is abnormally high  ASSESSMENT & PLAN:  Assessment/Plan:  A 57 y.o. female with stage IVA lung adenocarcinoma.  As the skin of her hand is much better, she will proceed with her 31st cycle of immunotherapy next week.  I will see her back in May 2024 before she heads into her 32nd cycle of pembrolizumab immunotherapy.  The patient understands all the plans discussed today and is in agreement with them.   Taegan Standage Kirby Funk, MD

## 2022-05-17 ENCOUNTER — Inpatient Hospital Stay (INDEPENDENT_AMBULATORY_CARE_PROVIDER_SITE_OTHER): Payer: Federal, State, Local not specified - PPO | Admitting: Oncology

## 2022-05-17 ENCOUNTER — Inpatient Hospital Stay: Payer: Federal, State, Local not specified - PPO

## 2022-05-17 VITALS — BP 146/77 | HR 71 | Temp 97.8°F | Resp 16 | Ht 65.0 in | Wt 101.6 lb

## 2022-05-17 DIAGNOSIS — C3432 Malignant neoplasm of lower lobe, left bronchus or lung: Secondary | ICD-10-CM | POA: Diagnosis not present

## 2022-05-17 DIAGNOSIS — C3481 Malignant neoplasm of overlapping sites of right bronchus and lung: Secondary | ICD-10-CM | POA: Diagnosis not present

## 2022-05-17 DIAGNOSIS — Z79899 Other long term (current) drug therapy: Secondary | ICD-10-CM | POA: Diagnosis not present

## 2022-05-17 LAB — CMP (CANCER CENTER ONLY)
ALT: 28 U/L (ref 0–44)
AST: 21 U/L (ref 15–41)
Albumin: 3.6 g/dL (ref 3.5–5.0)
Alkaline Phosphatase: 45 U/L (ref 38–126)
Anion gap: 9 (ref 5–15)
BUN: 13 mg/dL (ref 6–20)
CO2: 26 mmol/L (ref 22–32)
Calcium: 9 mg/dL (ref 8.9–10.3)
Chloride: 98 mmol/L (ref 98–111)
Creatinine: 0.58 mg/dL (ref 0.44–1.00)
GFR, Estimated: >60 mL/min (ref >60)
Glucose, Bld: 72 mg/dL (ref 70–99)
Potassium: 4 mmol/L (ref 3.5–5.1)
Sodium: 133 mmol/L — ABNORMAL LOW (ref 135–145)
Total Bilirubin: 0.2 mg/dL — ABNORMAL LOW (ref 0.3–1.2)
Total Protein: 6.7 g/dL (ref 6.5–8.1)

## 2022-05-17 LAB — TSH: TSH: 4.439 u[IU]/mL (ref 0.350–4.500)

## 2022-05-17 LAB — CBC AND DIFFERENTIAL
HCT: 36 (ref 36–46)
Hemoglobin: 12.4 (ref 12.0–16.0)
Neutrophils Absolute: 7.54
Platelets: 531 10*3/uL — AB (ref 150–400)
WBC: 9.2

## 2022-05-17 LAB — CBC W DIFFERENTIAL (~~LOC~~ CC SCANNED REPORT)

## 2022-05-17 LAB — CBC: RBC: 3.86 — AB (ref 3.87–5.11)

## 2022-05-22 ENCOUNTER — Other Ambulatory Visit: Payer: Self-pay | Admitting: Oncology

## 2022-05-22 DIAGNOSIS — M5030 Other cervical disc degeneration, unspecified cervical region: Secondary | ICD-10-CM

## 2022-05-22 DIAGNOSIS — J189 Pneumonia, unspecified organism: Secondary | ICD-10-CM | POA: Diagnosis not present

## 2022-05-22 DIAGNOSIS — C3490 Malignant neoplasm of unspecified part of unspecified bronchus or lung: Secondary | ICD-10-CM | POA: Diagnosis not present

## 2022-05-22 DIAGNOSIS — C3481 Malignant neoplasm of overlapping sites of right bronchus and lung: Secondary | ICD-10-CM

## 2022-05-22 DIAGNOSIS — R0789 Other chest pain: Secondary | ICD-10-CM

## 2022-05-22 DIAGNOSIS — S129XXS Fracture of neck, unspecified, sequela: Secondary | ICD-10-CM

## 2022-05-22 DIAGNOSIS — I700 Atherosclerosis of aorta: Secondary | ICD-10-CM | POA: Diagnosis not present

## 2022-05-22 MED ORDER — HYDROCODONE-ACETAMINOPHEN 7.5-325 MG PO TABS
1.0000 | ORAL_TABLET | Freq: Four times a day (QID) | ORAL | 0 refills | Status: DC | PRN
Start: 2022-05-22 — End: 2022-06-21

## 2022-05-25 DIAGNOSIS — Z681 Body mass index (BMI) 19 or less, adult: Secondary | ICD-10-CM | POA: Diagnosis not present

## 2022-05-25 DIAGNOSIS — H6123 Impacted cerumen, bilateral: Secondary | ICD-10-CM | POA: Diagnosis not present

## 2022-05-25 DIAGNOSIS — H9190 Unspecified hearing loss, unspecified ear: Secondary | ICD-10-CM | POA: Diagnosis not present

## 2022-05-29 ENCOUNTER — Inpatient Hospital Stay: Payer: Federal, State, Local not specified - PPO | Attending: Hematology and Oncology

## 2022-05-29 DIAGNOSIS — Z5112 Encounter for antineoplastic immunotherapy: Secondary | ICD-10-CM | POA: Insufficient documentation

## 2022-05-29 DIAGNOSIS — C3432 Malignant neoplasm of lower lobe, left bronchus or lung: Secondary | ICD-10-CM | POA: Insufficient documentation

## 2022-05-29 DIAGNOSIS — C3481 Malignant neoplasm of overlapping sites of right bronchus and lung: Secondary | ICD-10-CM | POA: Diagnosis not present

## 2022-05-29 LAB — CBC WITH DIFFERENTIAL (CANCER CENTER ONLY)
Abs Immature Granulocytes: 0.16 10*3/uL — ABNORMAL HIGH (ref 0.00–0.07)
Basophils Absolute: 0.1 10*3/uL (ref 0.0–0.1)
Basophils Relative: 1 %
Eosinophils Absolute: 0.2 10*3/uL (ref 0.0–0.5)
Eosinophils Relative: 2 %
HCT: 37.8 % (ref 36.0–46.0)
Hemoglobin: 12.5 g/dL (ref 12.0–15.0)
Immature Granulocytes: 2 %
Lymphocytes Relative: 14 %
Lymphs Abs: 1.3 10*3/uL (ref 0.7–4.0)
MCH: 31.8 pg (ref 26.0–34.0)
MCHC: 33.1 g/dL (ref 30.0–36.0)
MCV: 96.2 fL (ref 80.0–100.0)
Monocytes Absolute: 0.8 10*3/uL (ref 0.1–1.0)
Monocytes Relative: 8 %
Neutro Abs: 7.2 10*3/uL (ref 1.7–7.7)
Neutrophils Relative %: 73 %
Platelet Count: 334 10*3/uL (ref 150–400)
RBC: 3.93 MIL/uL (ref 3.87–5.11)
RDW: 14.2 % (ref 11.5–15.5)
WBC Count: 9.7 10*3/uL (ref 4.0–10.5)
nRBC: 0 % (ref 0.0–0.2)

## 2022-05-29 LAB — CMP (CANCER CENTER ONLY)
ALT: 21 U/L (ref 0–44)
AST: 23 U/L (ref 15–41)
Albumin: 3.8 g/dL (ref 3.5–5.0)
Alkaline Phosphatase: 48 U/L (ref 38–126)
Anion gap: 12 (ref 5–15)
BUN: 7 mg/dL (ref 6–20)
CO2: 23 mmol/L (ref 22–32)
Calcium: 9.2 mg/dL (ref 8.9–10.3)
Chloride: 99 mmol/L (ref 98–111)
Creatinine: 0.61 mg/dL (ref 0.44–1.00)
GFR, Estimated: >60 mL/min (ref >60)
Glucose, Bld: 90 mg/dL (ref 70–99)
Potassium: 3.9 mmol/L (ref 3.5–5.1)
Sodium: 134 mmol/L — ABNORMAL LOW (ref 135–145)
Total Bilirubin: 0.4 mg/dL (ref 0.3–1.2)
Total Protein: 6.7 g/dL (ref 6.5–8.1)

## 2022-05-30 MED FILL — Pembrolizumab IV Soln 100 MG/4ML (25 MG/ML): INTRAVENOUS | Qty: 8 | Status: AC

## 2022-05-31 ENCOUNTER — Inpatient Hospital Stay: Payer: Federal, State, Local not specified - PPO

## 2022-05-31 VITALS — BP 135/64 | HR 75 | Temp 98.6°F | Resp 18 | Ht 65.0 in | Wt 103.0 lb

## 2022-05-31 DIAGNOSIS — Z5112 Encounter for antineoplastic immunotherapy: Secondary | ICD-10-CM | POA: Diagnosis not present

## 2022-05-31 DIAGNOSIS — C3481 Malignant neoplasm of overlapping sites of right bronchus and lung: Secondary | ICD-10-CM | POA: Diagnosis not present

## 2022-05-31 DIAGNOSIS — C3432 Malignant neoplasm of lower lobe, left bronchus or lung: Secondary | ICD-10-CM | POA: Diagnosis not present

## 2022-05-31 MED ORDER — SODIUM CHLORIDE 0.9 % IV SOLN: Freq: Once | INTRAVENOUS | Status: AC

## 2022-05-31 MED ORDER — SODIUM CHLORIDE 0.9% FLUSH
10.0000 mL | INTRAVENOUS | Status: DC | PRN
  Administered 2022-05-31: 10 mL

## 2022-05-31 MED ORDER — HEPARIN SOD (PORK) LOCK FLUSH 100 UNIT/ML IV SOLN
500.0000 [IU] | Freq: Once | INTRAVENOUS | Status: AC | PRN
  Administered 2022-05-31: 500 [IU]

## 2022-05-31 MED ORDER — SODIUM CHLORIDE 0.9 % IV SOLN
200.0000 mg | Freq: Once | INTRAVENOUS | Status: AC
  Administered 2022-05-31: 200 mg via INTRAVENOUS
  Filled 2022-05-31: qty 8

## 2022-05-31 NOTE — Patient Instructions (Signed)

## 2022-06-13 DIAGNOSIS — L209 Atopic dermatitis, unspecified: Secondary | ICD-10-CM | POA: Diagnosis not present

## 2022-06-16 ENCOUNTER — Other Ambulatory Visit: Payer: Federal, State, Local not specified - PPO

## 2022-06-16 ENCOUNTER — Ambulatory Visit: Payer: Federal, State, Local not specified - PPO | Admitting: Hematology and Oncology

## 2022-06-21 ENCOUNTER — Ambulatory Visit: Payer: Federal, State, Local not specified - PPO

## 2022-06-21 ENCOUNTER — Other Ambulatory Visit: Payer: Self-pay | Admitting: Oncology

## 2022-06-21 DIAGNOSIS — S129XXS Fracture of neck, unspecified, sequela: Secondary | ICD-10-CM

## 2022-06-21 DIAGNOSIS — C3481 Malignant neoplasm of overlapping sites of right bronchus and lung: Secondary | ICD-10-CM

## 2022-06-21 DIAGNOSIS — R0789 Other chest pain: Secondary | ICD-10-CM

## 2022-06-21 DIAGNOSIS — M5030 Other cervical disc degeneration, unspecified cervical region: Secondary | ICD-10-CM

## 2022-06-21 MED ORDER — HYDROCODONE-ACETAMINOPHEN 7.5-325 MG PO TABS
1.0000 | ORAL_TABLET | Freq: Four times a day (QID) | ORAL | 0 refills | Status: DC | PRN
Start: 2022-06-21 — End: 2022-07-21

## 2022-07-03 ENCOUNTER — Other Ambulatory Visit: Payer: Self-pay | Admitting: Oncology

## 2022-07-03 MED ORDER — AMOXICILLIN-POT CLAVULANATE 875-125 MG PO TABS: 1.0000 | ORAL_TABLET | Freq: Two times a day (BID) | ORAL | 0 refills | Status: DC

## 2022-07-09 NOTE — Progress Notes (Unsigned)
[facility]  [address] [place],  [place]  [address] [phone]  Clinic Day:  07/10/2022  Referring physician: Eunice Blase, PA-C   HISTORY OF PRESENT ILLNESS:  The patient is a 57 y.o. female with stage IVA lung adenocarcinoma.  This staging is based upon initial scans showing bilateral lung involvement with her cancer.  She comes in today to be evaluated before heading into her 32nd cycle of maintenance pembrolizumab immunotherapy, which she receives every 6 weeks.  The major issue the patient has dealt with recently is a skin rash/lesion that appears to be dispersed in a dermatomal pattern around her right hip.  She has been on antibiotics, which have helped with it erythema.  Her rash was previously exquisitely painful; she only ranks the pain of this rash a 4 out of 10.   From a lung cancer   standpoint, she denies having any respiratory symptoms which concern her for disease progression.    PHYSICAL EXAM:  Blood pressure (!) 163/83, pulse 70, temperature 97.7 F (36.5 C), resp. rate 16, height 5\' 5"  (1.651 m), weight 100 lb (45.4 kg), SpO2 97 %. Wt Readings from Last 3 Encounters:  07/10/22 100 lb (45.4 kg)  05/31/22 103 lb 0.6 oz (46.7 kg)  05/17/22 101 lb 9.6 oz (46.1 kg)   Body mass index is 16.64 kg/m.  Performance status (ECOG): 0 - Asymptomatic  Physical Exam Vitals and nursing note reviewed.  Constitutional:      General: She is not in acute distress.    Appearance: Normal appearance.  HENT:     Head: Normocephalic and atraumatic.     Mouth/Throat:     Mouth: Mucous membranes are moist.     Pharynx: Oropharynx is clear. No oropharyngeal exudate or posterior oropharyngeal erythema.  Eyes:     General: No scleral icterus.    Extraocular Movements: Extraocular movements intact.     Conjunctiva/sclera: Conjunctivae normal.     Pupils: Pupils are equal, round, and reactive to light.  Cardiovascular:     Rate and Rhythm: Normal  rate and regular rhythm.     Heart sounds: Normal heart sounds. No murmur heard.    No friction rub. No gallop.  Pulmonary:     Effort: Pulmonary effort is normal.     Breath sounds: Normal breath sounds. No wheezing, rhonchi or rales.  Abdominal:     General: There is no distension.     Palpations: Abdomen is soft. There is no hepatomegaly, splenomegaly or mass.     Tenderness: There is no abdominal tenderness.  Musculoskeletal:        General: Normal range of motion.     Cervical back: Normal range of motion and neck supple. No tenderness.     Right lower leg: No edema.     Left lower leg: No edema.  Lymphadenopathy:     Cervical: No cervical adenopathy.     Upper Body:     Right upper body: No supraclavicular or axillary adenopathy.     Left upper body: No supraclavicular or axillary adenopathy.     Lower Body: No right inguinal adenopathy. No left inguinal adenopathy.  Skin:    General: Skin is warm and dry.     Coloration: Skin is not jaundiced.     Findings: No rash.  Neurological:     Mental Status: She is alert and oriented to person, place, and time.     Cranial Nerves: No cranial nerve deficit.  Psychiatric:        Mood and Affect: Mood normal.        Behavior: Behavior normal.        Thought Content: Thought content normal.  LABS:  Latest Reference Range & Units 07/10/22 00:00  WBC  10.1 (E)  RBC 3.87 - 5.11  4.93 (E)  Hemoglobin 12.0 - 16.0  16.0 (E)  HCT 36 - 46  47 ! (E)  Platelets 150 - 400 K/uL 348 (E)  NEUT#  7.27 (E)  !: Data is abnormal (E): External lab result  Latest Reference Range & Units 07/10/22 09:21  Sodium 135 - 145 mmol/L 136  Potassium 3.5 - 5.1 mmol/L 3.8  Chloride 98 - 111 mmol/L 103  CO2 22 - 32 mmol/L 24  Glucose 70 - 99 mg/dL 90  BUN 6 - 20 mg/dL 5 (L)  Creatinine 1.09 - 1.00 mg/dL 6.04  Calcium 8.9 - 54.0 mg/dL 9.0  Anion gap 5 - 15  9  Alkaline Phosphatase 38 - 126 U/L 57  Albumin 3.5 - 5.0 g/dL 4.0  AST 15 - 41 U/L 19  ALT 0  - 44 U/L 15  Total Protein 6.5 - 8.1 g/dL 7.1  Total Bilirubin 0.3 - 1.2 mg/dL 0.6  GFR, Est Non African American >60 mL/min >60  (L): Data is abnormally low  ASSESSMENT & PLAN:  Assessment/Plan:  A 57 y.o. female with stage IVA lung adenocarcinoma.  She will proceed with her 32nd cycle of pembrolizumab this week.  I am concerned her skin rash may actually be shingles.  Based upon this, I will prescribe her valacyclovir 1000 mg BID x 7 days.  Overall she appears to being doing well.  I will see her back in 6 weeks before she potentially heads into a 33rd cycle of pembrolizumab immunotherapy.  A chest CT will be done a day before her next visit to ascertain her new disease baseline after 32 cycles of pembrolizumab immunotherapy.  The patient understands all the plans discussed today and is in agreement with them.   Shakai Dolley Kirby Funk, MD

## 2022-07-10 ENCOUNTER — Inpatient Hospital Stay: Payer: Federal, State, Local not specified - PPO | Attending: Hematology and Oncology | Admitting: Oncology

## 2022-07-10 ENCOUNTER — Inpatient Hospital Stay: Payer: Federal, State, Local not specified - PPO

## 2022-07-10 ENCOUNTER — Other Ambulatory Visit: Payer: Self-pay | Admitting: Oncology

## 2022-07-10 DIAGNOSIS — Z79620 Long term (current) use of immunosuppressive biologic: Secondary | ICD-10-CM | POA: Insufficient documentation

## 2022-07-10 DIAGNOSIS — C3481 Malignant neoplasm of overlapping sites of right bronchus and lung: Secondary | ICD-10-CM

## 2022-07-10 DIAGNOSIS — C3432 Malignant neoplasm of lower lobe, left bronchus or lung: Secondary | ICD-10-CM | POA: Insufficient documentation

## 2022-07-10 DIAGNOSIS — Z5112 Encounter for antineoplastic immunotherapy: Secondary | ICD-10-CM | POA: Diagnosis not present

## 2022-07-10 DIAGNOSIS — R21 Rash and other nonspecific skin eruption: Secondary | ICD-10-CM | POA: Diagnosis not present

## 2022-07-10 LAB — CMP (CANCER CENTER ONLY)
ALT: 15 U/L (ref 0–44)
AST: 19 U/L (ref 15–41)
Albumin: 4 g/dL (ref 3.5–5.0)
Alkaline Phosphatase: 57 U/L (ref 38–126)
Anion gap: 9 (ref 5–15)
BUN: 5 mg/dL — ABNORMAL LOW (ref 6–20)
CO2: 24 mmol/L (ref 22–32)
Calcium: 9 mg/dL (ref 8.9–10.3)
Chloride: 103 mmol/L (ref 98–111)
Creatinine: 0.62 mg/dL (ref 0.44–1.00)
GFR, Estimated: >60 mL/min (ref >60)
Glucose, Bld: 90 mg/dL (ref 70–99)
Potassium: 3.8 mmol/L (ref 3.5–5.1)
Sodium: 136 mmol/L (ref 135–145)
Total Bilirubin: 0.6 mg/dL (ref 0.3–1.2)
Total Protein: 7.1 g/dL (ref 6.5–8.1)

## 2022-07-10 LAB — CBC: RBC: 4.93 (ref 3.87–5.11)

## 2022-07-10 LAB — TSH: TSH: 1.337 u[IU]/mL (ref 0.350–4.500)

## 2022-07-10 LAB — CBC AND DIFFERENTIAL
HCT: 47 — AB (ref 36–46)
Hemoglobin: 16 (ref 12.0–16.0)
Neutrophils Absolute: 7.27
Platelets: 348 10*3/uL (ref 150–400)
WBC: 10.1

## 2022-07-10 NOTE — Progress Notes (Signed)
C/O SKIN LESIONS ON RIGHT SIDE OF LOW BACK AROUND TO FRONT OF RIGHT THIGH

## 2022-07-12 ENCOUNTER — Inpatient Hospital Stay: Payer: Federal, State, Local not specified - PPO

## 2022-07-12 ENCOUNTER — Other Ambulatory Visit: Payer: Self-pay | Admitting: Oncology

## 2022-07-12 VITALS — BP 126/70 | HR 77 | Temp 98.3°F | Resp 18 | Ht 65.0 in | Wt 104.1 lb

## 2022-07-12 DIAGNOSIS — C3432 Malignant neoplasm of lower lobe, left bronchus or lung: Secondary | ICD-10-CM | POA: Diagnosis not present

## 2022-07-12 DIAGNOSIS — R21 Rash and other nonspecific skin eruption: Secondary | ICD-10-CM | POA: Diagnosis not present

## 2022-07-12 DIAGNOSIS — C3481 Malignant neoplasm of overlapping sites of right bronchus and lung: Secondary | ICD-10-CM

## 2022-07-12 DIAGNOSIS — Z5112 Encounter for antineoplastic immunotherapy: Secondary | ICD-10-CM | POA: Diagnosis not present

## 2022-07-12 DIAGNOSIS — Z79620 Long term (current) use of immunosuppressive biologic: Secondary | ICD-10-CM | POA: Diagnosis not present

## 2022-07-12 MED ORDER — SODIUM CHLORIDE 0.9% FLUSH
10.0000 mL | INTRAVENOUS | Status: DC | PRN
  Administered 2022-07-12: 10 mL

## 2022-07-12 MED ORDER — HEPARIN SOD (PORK) LOCK FLUSH 100 UNIT/ML IV SOLN
500.0000 [IU] | Freq: Once | INTRAVENOUS | Status: AC | PRN
  Administered 2022-07-12: 500 [IU]

## 2022-07-12 MED ORDER — SODIUM CHLORIDE 0.9 % IV SOLN
200.0000 mg | Freq: Once | INTRAVENOUS | Status: AC
  Administered 2022-07-12: 200 mg via INTRAVENOUS
  Filled 2022-07-12: qty 8

## 2022-07-12 MED ORDER — VALACYCLOVIR HCL 1 G PO TABS: 1000.0000 mg | ORAL_TABLET | Freq: Two times a day (BID) | ORAL | 0 refills | Status: AC

## 2022-07-12 MED ORDER — SODIUM CHLORIDE 0.9 % IV SOLN: Freq: Once | INTRAVENOUS | Status: AC

## 2022-07-12 NOTE — Patient Instructions (Signed)

## 2022-07-13 ENCOUNTER — Telehealth: Payer: Self-pay | Admitting: Oncology

## 2022-07-13 NOTE — Telephone Encounter (Signed)
CT Chest has been scheduled for 08/18/22 @ 3; Checking in @ 2 pm   Notified pt of date,time and instructions.

## 2022-07-20 ENCOUNTER — Other Ambulatory Visit: Payer: Self-pay | Admitting: Oncology

## 2022-07-21 ENCOUNTER — Other Ambulatory Visit: Payer: Self-pay | Admitting: Oncology

## 2022-07-21 DIAGNOSIS — S129XXS Fracture of neck, unspecified, sequela: Secondary | ICD-10-CM

## 2022-07-21 DIAGNOSIS — M5030 Other cervical disc degeneration, unspecified cervical region: Secondary | ICD-10-CM

## 2022-07-21 DIAGNOSIS — R0789 Other chest pain: Secondary | ICD-10-CM

## 2022-07-21 DIAGNOSIS — C3481 Malignant neoplasm of overlapping sites of right bronchus and lung: Secondary | ICD-10-CM

## 2022-07-21 MED ORDER — HYDROCODONE-ACETAMINOPHEN 7.5-325 MG PO TABS
1.0000 | ORAL_TABLET | Freq: Four times a day (QID) | ORAL | 0 refills | Status: DC | PRN
Start: 2022-07-21 — End: 2022-08-25

## 2022-08-02 DIAGNOSIS — S81812A Laceration without foreign body, left lower leg, initial encounter: Secondary | ICD-10-CM | POA: Diagnosis not present

## 2022-08-02 DIAGNOSIS — I10 Essential (primary) hypertension: Secondary | ICD-10-CM | POA: Diagnosis not present

## 2022-08-07 DIAGNOSIS — E785 Hyperlipidemia, unspecified: Secondary | ICD-10-CM | POA: Diagnosis not present

## 2022-08-07 DIAGNOSIS — C3490 Malignant neoplasm of unspecified part of unspecified bronchus or lung: Secondary | ICD-10-CM | POA: Diagnosis not present

## 2022-08-07 DIAGNOSIS — J449 Chronic obstructive pulmonary disease, unspecified: Secondary | ICD-10-CM | POA: Diagnosis not present

## 2022-08-07 DIAGNOSIS — Z79899 Other long term (current) drug therapy: Secondary | ICD-10-CM | POA: Diagnosis not present

## 2022-08-18 DIAGNOSIS — I70212 Atherosclerosis of native arteries of extremities with intermittent claudication, left leg: Secondary | ICD-10-CM | POA: Diagnosis not present

## 2022-08-18 DIAGNOSIS — I700 Atherosclerosis of aorta: Secondary | ICD-10-CM | POA: Diagnosis not present

## 2022-08-18 DIAGNOSIS — C3490 Malignant neoplasm of unspecified part of unspecified bronchus or lung: Secondary | ICD-10-CM | POA: Diagnosis not present

## 2022-08-18 DIAGNOSIS — I739 Peripheral vascular disease, unspecified: Secondary | ICD-10-CM | POA: Diagnosis not present

## 2022-08-18 DIAGNOSIS — J439 Emphysema, unspecified: Secondary | ICD-10-CM | POA: Diagnosis not present

## 2022-08-18 LAB — CBC AND DIFFERENTIAL
HCT: 43 (ref 36–46)
Hemoglobin: 14.6 (ref 12.0–16.0)
Neutrophils Absolute: 7.66
Platelets: 294 10*3/uL (ref 150–400)
WBC: 9.7

## 2022-08-18 LAB — COMPREHENSIVE METABOLIC PANEL
Albumin: 4 (ref 3.5–5.0)
Calcium: 9.3 (ref 8.7–10.7)

## 2022-08-18 LAB — HEPATIC FUNCTION PANEL
ALT: 14 U/L (ref 7–35)
AST: 32 (ref 13–35)
Alkaline Phosphatase: 57 (ref 25–125)
Bilirubin, Total: 0.4

## 2022-08-18 LAB — BASIC METABOLIC PANEL
BUN: 12 (ref 4–21)
CO2: 25 — AB (ref 13–22)
Chloride: 106 (ref 99–108)
Creatinine: 0.5 (ref 0.5–1.1)
Glucose: 88
Potassium: 4.1 mEq/L (ref 3.5–5.1)
Sodium: 137 (ref 137–147)

## 2022-08-18 LAB — CBC: RBC: 4.58 (ref 3.87–5.11)

## 2022-08-20 NOTE — Progress Notes (Signed)
[facility]  [address] [place],  [place]  [address] [phone]  Clinic Day:  08/21/2022  Referring physician: Eunice Blase, PA-C  HISTORY OF PRESENT ILLNESS:  The patient is a 57 y.o. female with stage IVA lung adenocarcinoma.  This staging is based upon initial scans showing bilateral lung involvement with her cancer.  She comes in today to go over her CT scans to ascertain her new disease baseline after 32 cycles of maintenance pembrolizumab immunotherapy, which she receives every 6 weeks.  Since her last visit, the patient has been doing well.  From a lung cancer standpoint, she denies having any respiratory symptoms which concern her for disease progression.    PHYSICAL EXAM:  Blood pressure (!) 143/73, pulse 73, temperature 98.2 F (36.8 C), resp. rate 14, height 5\' 5"  (1.651 m), weight 102 lb 8 oz (46.5 kg), SpO2 96%. Wt Readings from Last 3 Encounters:  08/21/22 102 lb 8 oz (46.5 kg)  07/12/22 104 lb 1.9 oz (47.2 kg)  07/10/22 100 lb (45.4 kg)   Body mass index is 17.06 kg/m.  Performance status (ECOG): 0 - Asymptomatic  Physical Exam Vitals and nursing note reviewed.  Constitutional:      General: She is not in acute distress.    Appearance: Normal appearance.  HENT:     Head: Normocephalic and atraumatic.     Mouth/Throat:     Mouth: Mucous membranes are moist.     Pharynx: Oropharynx is clear. No oropharyngeal exudate or posterior oropharyngeal erythema.  Eyes:     General: No scleral icterus.    Extraocular Movements: Extraocular movements intact.     Conjunctiva/sclera: Conjunctivae normal.     Pupils: Pupils are equal, round, and reactive to light.  Cardiovascular:     Rate and Rhythm: Normal rate and regular rhythm.     Heart sounds: Normal heart sounds. No murmur heard.    No friction rub. No gallop.  Pulmonary:     Effort: Pulmonary effort is normal.     Breath sounds: Normal breath sounds. No wheezing, rhonchi or  rales.  Abdominal:     General: There is no distension.     Palpations: Abdomen is soft. There is no hepatomegaly, splenomegaly or mass.     Tenderness: There is no abdominal tenderness.  Musculoskeletal:        General: Normal range of motion.     Cervical back: Normal range of motion and neck supple. No tenderness.     Right lower leg: No edema.     Left lower leg: No edema.  Lymphadenopathy:     Cervical: No cervical adenopathy.     Upper Body:     Right upper body: No supraclavicular or axillary adenopathy.     Left upper body: No supraclavicular or axillary adenopathy.     Lower Body: No right inguinal adenopathy. No left inguinal adenopathy.  Skin:    General: Skin is warm and dry.     Coloration: Skin is not jaundiced.     Findings: No rash.  Neurological:     Mental Status: She is alert and oriented to person, place, and time.     Cranial Nerves: No cranial nerve deficit.  Psychiatric:        Mood and Affect: Mood normal.        Behavior: Behavior normal.        Thought Content: Thought content normal.   SCANS:  Her chest CT done recently revealed  the following: FINDINGS: Cardiovascular: Scattered vascular calcifications identified along the thoracic aorta. Coronary artery calcifications are seen. There is significant calcified plaque along the origin of the left subclavian artery with a high-grade stenosis. Heart is nonenlarged. Small pericardial effusion. There is right IJ chest port in place which is accessed. Tip extends along the right atrium.  Mediastinum/Nodes: Grossly normal caliber thoracic esophagus. Heterogeneous thyroid gland. There are several prominent lymph nodes in the mediastinum. Example to the left of the carina which previously measured 20 x 9 mm is unchanged today on series 2, image 50. Other mediastinal nodes are similar as well. No hilar or axillary nodal enlargement.  Lungs/Pleura: Centrilobular emphysematous lung changes are identified.  There is some basilar scarring or atelectatic changes. No pleural effusion. On the prior study of April 2024 there is extensive consolidative lung opacities. This has improved. Today there are multiple small lung nodule seen. These are compared to the study of February 03, 2022 as that study did not have separate consolidative lung opacity. The vast majority of these nodules are similar. Example 5 mm right lower lobe anteriorly on image 301 102 of series  Upper Abdomen: No acute abnormality. Preserved adrenal glands.  Musculoskeletal: Mild degenerative changes of the spine. Fixation hardware along the lower cervical spine.  IMPRESSION: Stable abnormal mediastinal lymph nodes.  Multiple lung nodules identified. The vast majority are stable. There is slight increase of the small nodule in the left lower lobe and 1 new 6 mm right upper lobe nodule. Recommend continued short-term follow up in 3 months.  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 Please correlate for any clinical evidence of stenosis.  Chest port.  Aortic Atherosclerosis (ICD10-I70.0) and Emphysema (ICD10-J43.9).   LABS:   ASSESSMENT & PLAN:  Assessment/Plan:  A 57 y.o. female with stage IVA lung adenocarcinoma.  In clinic today, I went over her CT scan images with her, for which she could see there remains no obvious evidence of disease progression.  The large pneumonia which incorporated her right lower lung has completely dissipated, for which she could appreciate.  Clinically, the patient is doing well.  She will proceed with her 33rd cycle of pembrolizumab this week.  I will see her back in 6 weeks before she heads into her 34th cycle of pembrolizumab immunotherapy.  The patient understands all the plans discussed today and is in agreement with them.    Kirby Funk, MD

## 2022-08-21 ENCOUNTER — Inpatient Hospital Stay: Payer: Federal, State, Local not specified - PPO | Attending: Hematology and Oncology | Admitting: Oncology

## 2022-08-21 ENCOUNTER — Telehealth: Payer: Self-pay | Admitting: Oncology

## 2022-08-21 ENCOUNTER — Other Ambulatory Visit: Payer: Self-pay | Admitting: Oncology

## 2022-08-21 DIAGNOSIS — C3481 Malignant neoplasm of overlapping sites of right bronchus and lung: Secondary | ICD-10-CM

## 2022-08-21 NOTE — Telephone Encounter (Signed)
08/21/22 Spoke with patient and scheduled next appt

## 2022-08-23 ENCOUNTER — Encounter: Payer: Self-pay | Admitting: Oncology

## 2022-08-23 MED FILL — Pembrolizumab IV Soln 100 MG/4ML (25 MG/ML): INTRAVENOUS | Qty: 8 | Status: AC

## 2022-08-23 NOTE — Telephone Encounter (Signed)
Created in error

## 2022-08-24 ENCOUNTER — Inpatient Hospital Stay: Payer: Federal, State, Local not specified - PPO | Attending: Hematology and Oncology

## 2022-08-24 VITALS — BP 138/66 | HR 78 | Temp 98.0°F | Resp 16 | Ht 65.0 in | Wt 103.0 lb

## 2022-08-24 DIAGNOSIS — Z5112 Encounter for antineoplastic immunotherapy: Secondary | ICD-10-CM | POA: Insufficient documentation

## 2022-08-24 DIAGNOSIS — C3432 Malignant neoplasm of lower lobe, left bronchus or lung: Secondary | ICD-10-CM | POA: Insufficient documentation

## 2022-08-24 DIAGNOSIS — C3481 Malignant neoplasm of overlapping sites of right bronchus and lung: Secondary | ICD-10-CM

## 2022-08-24 DIAGNOSIS — I10 Essential (primary) hypertension: Secondary | ICD-10-CM | POA: Diagnosis not present

## 2022-08-24 DIAGNOSIS — S81811D Laceration without foreign body, right lower leg, subsequent encounter: Secondary | ICD-10-CM | POA: Diagnosis not present

## 2022-08-24 DIAGNOSIS — C3490 Malignant neoplasm of unspecified part of unspecified bronchus or lung: Secondary | ICD-10-CM | POA: Diagnosis not present

## 2022-08-24 MED ORDER — SODIUM CHLORIDE 0.9 % IV SOLN
200.0000 mg | Freq: Once | INTRAVENOUS | Status: AC
  Administered 2022-08-24: 200 mg via INTRAVENOUS
  Filled 2022-08-24: qty 8

## 2022-08-24 MED ORDER — SODIUM CHLORIDE 0.9% FLUSH: 10.0000 mL | INTRAVENOUS | Status: DC | PRN

## 2022-08-24 MED ORDER — HEPARIN SOD (PORK) LOCK FLUSH 100 UNIT/ML IV SOLN: 500.0000 [IU] | Freq: Once | INTRAVENOUS | Status: DC | PRN

## 2022-08-24 MED ORDER — SODIUM CHLORIDE 0.9 % IV SOLN: Freq: Once | INTRAVENOUS | Status: AC

## 2022-08-24 NOTE — Patient Instructions (Signed)

## 2022-08-25 ENCOUNTER — Other Ambulatory Visit: Payer: Self-pay | Admitting: Hematology and Oncology

## 2022-08-25 DIAGNOSIS — R0789 Other chest pain: Secondary | ICD-10-CM

## 2022-08-25 DIAGNOSIS — M5030 Other cervical disc degeneration, unspecified cervical region: Secondary | ICD-10-CM

## 2022-08-25 DIAGNOSIS — S129XXS Fracture of neck, unspecified, sequela: Secondary | ICD-10-CM

## 2022-08-25 DIAGNOSIS — C3481 Malignant neoplasm of overlapping sites of right bronchus and lung: Secondary | ICD-10-CM

## 2022-08-25 MED ORDER — HYDROCODONE-ACETAMINOPHEN 7.5-325 MG PO TABS: 1.0000 | ORAL_TABLET | Freq: Four times a day (QID) | ORAL | 0 refills | Status: DC | PRN

## 2022-08-26 DIAGNOSIS — M79661 Pain in right lower leg: Secondary | ICD-10-CM | POA: Diagnosis not present

## 2022-08-26 DIAGNOSIS — L03115 Cellulitis of right lower limb: Secondary | ICD-10-CM | POA: Diagnosis not present

## 2022-08-26 DIAGNOSIS — S81801A Unspecified open wound, right lower leg, initial encounter: Secondary | ICD-10-CM | POA: Diagnosis not present

## 2022-08-26 DIAGNOSIS — W228XXA Striking against or struck by other objects, initial encounter: Secondary | ICD-10-CM | POA: Diagnosis not present

## 2022-08-26 DIAGNOSIS — Z85118 Personal history of other malignant neoplasm of bronchus and lung: Secondary | ICD-10-CM | POA: Diagnosis not present

## 2022-08-29 ENCOUNTER — Encounter: Payer: Self-pay | Admitting: Oncology

## 2022-09-01 DIAGNOSIS — X58XXXA Exposure to other specified factors, initial encounter: Secondary | ICD-10-CM | POA: Diagnosis not present

## 2022-09-01 DIAGNOSIS — S81811A Laceration without foreign body, right lower leg, initial encounter: Secondary | ICD-10-CM | POA: Diagnosis not present

## 2022-09-04 ENCOUNTER — Encounter: Payer: Self-pay | Admitting: Oncology

## 2022-09-07 ENCOUNTER — Encounter: Payer: Self-pay | Admitting: Oncology

## 2022-09-11 DIAGNOSIS — X58XXXA Exposure to other specified factors, initial encounter: Secondary | ICD-10-CM | POA: Diagnosis not present

## 2022-09-11 DIAGNOSIS — S81811A Laceration without foreign body, right lower leg, initial encounter: Secondary | ICD-10-CM | POA: Diagnosis not present

## 2022-09-11 DIAGNOSIS — S81801A Unspecified open wound, right lower leg, initial encounter: Secondary | ICD-10-CM | POA: Diagnosis not present

## 2022-09-18 DIAGNOSIS — S81801A Unspecified open wound, right lower leg, initial encounter: Secondary | ICD-10-CM | POA: Diagnosis not present

## 2022-09-18 DIAGNOSIS — S81811A Laceration without foreign body, right lower leg, initial encounter: Secondary | ICD-10-CM | POA: Diagnosis not present

## 2022-09-18 DIAGNOSIS — X58XXXA Exposure to other specified factors, initial encounter: Secondary | ICD-10-CM | POA: Diagnosis not present

## 2022-09-29 ENCOUNTER — Other Ambulatory Visit: Payer: Self-pay | Admitting: Oncology

## 2022-09-29 DIAGNOSIS — C3481 Malignant neoplasm of overlapping sites of right bronchus and lung: Secondary | ICD-10-CM

## 2022-09-29 DIAGNOSIS — R0789 Other chest pain: Secondary | ICD-10-CM

## 2022-09-29 DIAGNOSIS — S129XXS Fracture of neck, unspecified, sequela: Secondary | ICD-10-CM

## 2022-09-29 DIAGNOSIS — M5030 Other cervical disc degeneration, unspecified cervical region: Secondary | ICD-10-CM

## 2022-09-29 MED ORDER — HYDROCODONE-ACETAMINOPHEN 7.5-325 MG PO TABS
1.0000 | ORAL_TABLET | Freq: Four times a day (QID) | ORAL | 0 refills | Status: DC | PRN
Start: 2022-09-29 — End: 2022-11-03

## 2022-10-01 NOTE — Progress Notes (Unsigned)
[facility]  [address] [place],  [place]  [address] [phone]  Clinic Day:  10/02/2022  Referring physician: Eunice Blase, PA-C  HISTORY OF PRESENT ILLNESS:  The patient is a 57 y.o. female with stage IVA lung adenocarcinoma.  This staging is based upon initial scans showing bilateral lung involvement with her cancer.  She comes in today to be evaluated before heading into her 34th cycle of maintenance pembrolizumab immunotherapy, which she receives every 6 weeks.  Since her last visit, the patient has been doing well.  From a lung cancer standpoint, she denies having any respiratory symptoms which concern her for disease progression.    PHYSICAL EXAM:  Blood pressure 134/67, pulse 63, temperature 98 F (36.7 C), resp. rate 16, height 5\' 5"  (1.651 m), weight 102 lb 1.6 oz (46.3 kg), SpO2 96%. Wt Readings from Last 3 Encounters:  10/02/22 102 lb 1.6 oz (46.3 kg)  08/24/22 103 lb (46.7 kg)  08/21/22 102 lb 8 oz (46.5 kg)   Body mass index is 16.99 kg/m.  Performance status (ECOG): 0 - Asymptomatic  Physical Exam Vitals and nursing note reviewed.  Constitutional:      General: She is not in acute distress.    Appearance: Normal appearance.  HENT:     Head: Normocephalic and atraumatic.     Mouth/Throat:     Mouth: Mucous membranes are moist.     Pharynx: Oropharynx is clear. No oropharyngeal exudate or posterior oropharyngeal erythema.  Eyes:     General: No scleral icterus.    Extraocular Movements: Extraocular movements intact.     Conjunctiva/sclera: Conjunctivae normal.     Pupils: Pupils are equal, round, and reactive to light.  Cardiovascular:     Rate and Rhythm: Normal rate and regular rhythm.     Heart sounds: Normal heart sounds. No murmur heard.    No friction rub. No gallop.  Pulmonary:     Effort: Pulmonary effort is normal.     Breath sounds: Normal breath sounds. No wheezing, rhonchi or rales.  Abdominal:      General: There is no distension.     Palpations: Abdomen is soft. There is no hepatomegaly, splenomegaly or mass.     Tenderness: There is no abdominal tenderness.  Musculoskeletal:        General: Normal range of motion.     Cervical back: Normal range of motion and neck supple. No tenderness.     Right lower leg: No edema.     Left lower leg: No edema.  Lymphadenopathy:     Cervical: No cervical adenopathy.     Upper Body:     Right upper body: No supraclavicular or axillary adenopathy.     Left upper body: No supraclavicular or axillary adenopathy.     Lower Body: No right inguinal adenopathy. No left inguinal adenopathy.  Skin:    General: Skin is warm and dry.     Coloration: Skin is not jaundiced.     Findings: No rash.  Neurological:     Mental Status: She is alert and oriented to person, place, and time.     Cranial Nerves: No cranial nerve deficit.  Psychiatric:        Mood and Affect: Mood normal.        Behavior: Behavior normal.        Thought Content: Thought content normal.    LABS:  Latest Reference Range & Units 10/02/22 00:00 10/02/22 09:12  Sodium 135 -  145 mmol/L  138  Potassium 3.5 - 5.1 mmol/L  3.7  Chloride 98 - 111 mmol/L  102  CO2 22 - 32 mmol/L  25  Glucose 70 - 99 mg/dL  69 (L)  BUN 6 - 20 mg/dL  10  Creatinine 0.10 - 1.00 mg/dL  9.32  Calcium 8.9 - 35.5 mg/dL  9.2  Anion gap 5 - 15   11  Alkaline Phosphatase 38 - 126 U/L  48  Albumin 3.5 - 5.0 g/dL  4.3  AST 15 - 41 U/L  19  ALT 0 - 44 U/L  17  Total Protein 6.5 - 8.1 g/dL  7.4  Total Bilirubin 0.3 - 1.2 mg/dL  0.7  GFR, Est Non African American >60 mL/min  >60  CBC W DIFFERENTIAL ( CC SCANNED REPORT)   Rpt  WBC  7.1 (E)   RBC 3.87 - 5.11  4.73 (E)   Hemoglobin 12.0 - 16.0  15.1 (E)   HCT 36 - 46  44 (E)   Platelets 150 - 400 K/uL 279 (E)   NEUT#  4.97 (E)   (L): Data is abnormally low Rpt: View report in Results Review for more information (E): External lab result  ASSESSMENT  & PLAN:  Assessment/Plan:  A 57 y.o. female with stage IVA lung adenocarcinoma.  She will proceed with her 34th cycle of pembrolizumab this week.  Clinically, the patient is doing well.  I will see her back in 6 weeks before she heads into her 35th cycle of pembrolizumab immunotherapy.  The patient understands all the plans discussed today and is in agreement with them.   Shyloh Krinke Kirby Funk, MD

## 2022-10-02 ENCOUNTER — Inpatient Hospital Stay: Payer: Federal, State, Local not specified - PPO | Attending: Hematology and Oncology | Admitting: Oncology

## 2022-10-02 ENCOUNTER — Inpatient Hospital Stay: Payer: Federal, State, Local not specified - PPO

## 2022-10-02 DIAGNOSIS — Z5112 Encounter for antineoplastic immunotherapy: Secondary | ICD-10-CM | POA: Diagnosis not present

## 2022-10-02 DIAGNOSIS — C3481 Malignant neoplasm of overlapping sites of right bronchus and lung: Secondary | ICD-10-CM | POA: Insufficient documentation

## 2022-10-02 DIAGNOSIS — C3482 Malignant neoplasm of overlapping sites of left bronchus and lung: Secondary | ICD-10-CM | POA: Insufficient documentation

## 2022-10-02 DIAGNOSIS — S81801A Unspecified open wound, right lower leg, initial encounter: Secondary | ICD-10-CM | POA: Diagnosis not present

## 2022-10-02 DIAGNOSIS — X58XXXA Exposure to other specified factors, initial encounter: Secondary | ICD-10-CM | POA: Diagnosis not present

## 2022-10-02 DIAGNOSIS — S81811A Laceration without foreign body, right lower leg, initial encounter: Secondary | ICD-10-CM | POA: Diagnosis not present

## 2022-10-02 LAB — CMP (CANCER CENTER ONLY)
ALT: 17 U/L (ref 0–44)
AST: 19 U/L (ref 15–41)
Albumin: 4.3 g/dL (ref 3.5–5.0)
Alkaline Phosphatase: 48 U/L (ref 38–126)
Anion gap: 11 (ref 5–15)
BUN: 10 mg/dL (ref 6–20)
CO2: 25 mmol/L (ref 22–32)
Calcium: 9.2 mg/dL (ref 8.9–10.3)
Chloride: 102 mmol/L (ref 98–111)
Creatinine: 0.72 mg/dL (ref 0.44–1.00)
GFR, Estimated: >60 mL/min (ref >60)
Glucose, Bld: 69 mg/dL — ABNORMAL LOW (ref 70–99)
Potassium: 3.7 mmol/L (ref 3.5–5.1)
Sodium: 138 mmol/L (ref 135–145)
Total Bilirubin: 0.7 mg/dL (ref 0.3–1.2)
Total Protein: 7.4 g/dL (ref 6.5–8.1)

## 2022-10-02 LAB — CBC AND DIFFERENTIAL
HCT: 44 (ref 36–46)
Hemoglobin: 15.1 (ref 12.0–16.0)
Neutrophils Absolute: 4.97
Platelets: 279 10*3/uL (ref 150–400)
WBC: 7.1

## 2022-10-02 LAB — CBC W DIFFERENTIAL (~~LOC~~ CC SCANNED REPORT)

## 2022-10-02 LAB — CBC: RBC: 4.73 (ref 3.87–5.11)

## 2022-10-04 ENCOUNTER — Inpatient Hospital Stay: Payer: Federal, State, Local not specified - PPO

## 2022-10-04 VITALS — BP 135/67 | HR 60 | Temp 97.7°F | Resp 18 | Ht 65.0 in | Wt 102.0 lb

## 2022-10-04 DIAGNOSIS — C3481 Malignant neoplasm of overlapping sites of right bronchus and lung: Secondary | ICD-10-CM | POA: Diagnosis not present

## 2022-10-04 DIAGNOSIS — Z5112 Encounter for antineoplastic immunotherapy: Secondary | ICD-10-CM | POA: Diagnosis not present

## 2022-10-04 DIAGNOSIS — C3482 Malignant neoplasm of overlapping sites of left bronchus and lung: Secondary | ICD-10-CM | POA: Diagnosis not present

## 2022-10-04 MED ORDER — SODIUM CHLORIDE 0.9 % IV SOLN: Freq: Once | INTRAVENOUS | Status: AC

## 2022-10-04 MED ORDER — SODIUM CHLORIDE 0.9% FLUSH
10.0000 mL | INTRAVENOUS | Status: DC | PRN
  Administered 2022-10-04: 10 mL

## 2022-10-04 MED ORDER — SODIUM CHLORIDE 0.9 % IV SOLN
200.0000 mg | Freq: Once | INTRAVENOUS | Status: AC
  Administered 2022-10-04: 200 mg via INTRAVENOUS
  Filled 2022-10-04: qty 8

## 2022-10-04 MED ORDER — HEPARIN SOD (PORK) LOCK FLUSH 100 UNIT/ML IV SOLN
500.0000 [IU] | Freq: Once | INTRAVENOUS | Status: AC | PRN
  Administered 2022-10-04: 500 [IU]

## 2022-10-04 NOTE — Patient Instructions (Signed)

## 2022-10-16 DIAGNOSIS — S81801A Unspecified open wound, right lower leg, initial encounter: Secondary | ICD-10-CM | POA: Diagnosis not present

## 2022-10-16 DIAGNOSIS — X58XXXA Exposure to other specified factors, initial encounter: Secondary | ICD-10-CM | POA: Diagnosis not present

## 2022-10-16 DIAGNOSIS — S81811A Laceration without foreign body, right lower leg, initial encounter: Secondary | ICD-10-CM | POA: Diagnosis not present

## 2022-11-03 ENCOUNTER — Other Ambulatory Visit: Payer: Self-pay | Admitting: Oncology

## 2022-11-03 DIAGNOSIS — C3481 Malignant neoplasm of overlapping sites of right bronchus and lung: Secondary | ICD-10-CM

## 2022-11-03 DIAGNOSIS — R0789 Other chest pain: Secondary | ICD-10-CM

## 2022-11-03 DIAGNOSIS — S129XXS Fracture of neck, unspecified, sequela: Secondary | ICD-10-CM

## 2022-11-03 DIAGNOSIS — M5030 Other cervical disc degeneration, unspecified cervical region: Secondary | ICD-10-CM

## 2022-11-03 MED ORDER — HYDROCODONE-ACETAMINOPHEN 7.5-325 MG PO TABS: 1.0000 | ORAL_TABLET | Freq: Four times a day (QID) | ORAL | 0 refills | Status: DC | PRN

## 2022-11-06 ENCOUNTER — Other Ambulatory Visit: Payer: Self-pay

## 2022-11-06 DIAGNOSIS — I739 Peripheral vascular disease, unspecified: Secondary | ICD-10-CM

## 2022-11-07 ENCOUNTER — Other Ambulatory Visit: Payer: Self-pay | Admitting: Oncology

## 2022-11-12 NOTE — Progress Notes (Signed)
[facility]  [address] [place],  [place]  [address] [phone]  Clinic Day:  11/13/2022  Referring physician: Eunice Blase, PA-C  HISTORY OF PRESENT ILLNESS:  The patient is a 57 y.o. female with stage IVA lung adenocarcinoma.  This staging is based upon initial scans showing bilateral lung involvement with her cancer.  She comes in today to be evaluated before heading into her 35th cycle of maintenance pembrolizumab immunotherapy, which she receives every 6 weeks.  Since her last visit, the patient has been doing well.  From a lung cancer standpoint, she denies having any respiratory symptoms which concern her for disease progression.    PHYSICAL EXAM:  Blood pressure 124/69, pulse 80, temperature 98 F (36.7 C), resp. rate 16, height 5\' 5"  (1.651 m), weight 105 lb 12.8 oz (48 kg), SpO2 92%. Wt Readings from Last 3 Encounters:  11/16/22 103 lb 0.6 oz (46.7 kg)  11/16/22 103 lb 11.2 oz (47 kg)  11/13/22 105 lb 12.8 oz (48 kg)   Body mass index is 17.61 kg/m.  Performance status (ECOG): 0 - Asymptomatic  Physical Exam Vitals and nursing note reviewed.  Constitutional:      General: She is not in acute distress.    Appearance: Normal appearance.  HENT:     Head: Normocephalic and atraumatic.     Mouth/Throat:     Mouth: Mucous membranes are moist.     Pharynx: Oropharynx is clear. No oropharyngeal exudate or posterior oropharyngeal erythema.  Eyes:     General: No scleral icterus.    Extraocular Movements: Extraocular movements intact.     Conjunctiva/sclera: Conjunctivae normal.     Pupils: Pupils are equal, round, and reactive to light.  Cardiovascular:     Rate and Rhythm: Normal rate and regular rhythm.     Heart sounds: Normal heart sounds. No murmur heard.    No friction rub. No gallop.  Pulmonary:     Effort: Pulmonary effort is normal.     Breath sounds: Normal breath sounds. No wheezing, rhonchi or rales.  Abdominal:      General: There is no distension.     Palpations: Abdomen is soft. There is no hepatomegaly, splenomegaly or mass.     Tenderness: There is no abdominal tenderness.  Musculoskeletal:        General: Normal range of motion.     Cervical back: Normal range of motion and neck supple. No tenderness.     Right lower leg: No edema.     Left lower leg: No edema.  Lymphadenopathy:     Cervical: No cervical adenopathy.     Upper Body:     Right upper body: No supraclavicular or axillary adenopathy.     Left upper body: No supraclavicular or axillary adenopathy.     Lower Body: No right inguinal adenopathy. No left inguinal adenopathy.  Skin:    General: Skin is warm and dry.     Coloration: Skin is not jaundiced.     Findings: No rash.  Neurological:     Mental Status: She is alert and oriented to person, place, and time.     Cranial Nerves: No cranial nerve deficit.  Psychiatric:        Mood and Affect: Mood normal.        Behavior: Behavior normal.        Thought Content: Thought content normal.    LABS:  Latest Reference Range & Units 11/13/22 00:00 11/13/22 09:17  Sodium 135 - 145 mmol/L  136  Potassium 3.5 - 5.1 mmol/L  4.1  Chloride 98 - 111 mmol/L  101  CO2 22 - 32 mmol/L  25  Glucose 70 - 99 mg/dL  58 (L)  BUN 6 - 20 mg/dL  11  Creatinine 8.29 - 1.00 mg/dL  5.62  Calcium 8.9 - 13.0 mg/dL  9.3  Anion gap 5 - 15   10  Alkaline Phosphatase 38 - 126 U/L  43  Albumin 3.5 - 5.0 g/dL  4.2  AST 15 - 41 U/L  20  ALT 0 - 44 U/L  14  Total Protein 6.5 - 8.1 g/dL  7.4  Total Bilirubin 0.3 - 1.2 mg/dL  0.5  GFR, Est Non African American >60 mL/min  >60  WBC  8.7 (E)   RBC 3.87 - 5.11  4.57 (E)   Hemoglobin 12.0 - 16.0  14.9 (E)   HCT 36 - 46  43 (E)   Platelets 150 - 400 K/uL 309 (E)   NEUT#  6.26 (E)   TSH 0.350 - 4.500 uIU/mL  3.642  Thyroxine (T4) 4.5 - 12.0 ug/dL  7.8  (L): Data is abnormally low (E): External lab result  ASSESSMENT & PLAN:  Assessment/Plan:  A 57 y.o.  female with stage IVA lung adenocarcinoma.  She will proceed with her 35th cycle of pembrolizumab this week.  Clinically, the patient is doing well.  I will see her back in 6 weeks before she heads into her 36th cycle of pembrolizumab immunotherapy.  The patient understands all the plans discussed today and is in agreement with them.   Jayona Mccaig Kirby Funk, MD

## 2022-11-13 ENCOUNTER — Inpatient Hospital Stay: Payer: Federal, State, Local not specified - PPO

## 2022-11-13 ENCOUNTER — Inpatient Hospital Stay: Payer: Federal, State, Local not specified - PPO | Attending: Hematology and Oncology | Admitting: Oncology

## 2022-11-13 ENCOUNTER — Other Ambulatory Visit: Payer: Self-pay | Admitting: Oncology

## 2022-11-13 DIAGNOSIS — Z79620 Long term (current) use of immunosuppressive biologic: Secondary | ICD-10-CM | POA: Insufficient documentation

## 2022-11-13 DIAGNOSIS — C3481 Malignant neoplasm of overlapping sites of right bronchus and lung: Secondary | ICD-10-CM | POA: Insufficient documentation

## 2022-11-13 DIAGNOSIS — Z5112 Encounter for antineoplastic immunotherapy: Secondary | ICD-10-CM | POA: Insufficient documentation

## 2022-11-13 LAB — CBC AND DIFFERENTIAL
HCT: 43 (ref 36–46)
Hemoglobin: 14.9 (ref 12.0–16.0)
Neutrophils Absolute: 6.26
Platelets: 309 10*3/uL (ref 150–400)
WBC: 8.7

## 2022-11-13 LAB — CMP (CANCER CENTER ONLY)
ALT: 14 U/L (ref 0–44)
AST: 20 U/L (ref 15–41)
Albumin: 4.2 g/dL (ref 3.5–5.0)
Alkaline Phosphatase: 43 U/L (ref 38–126)
Anion gap: 10 (ref 5–15)
BUN: 11 mg/dL (ref 6–20)
CO2: 25 mmol/L (ref 22–32)
Calcium: 9.3 mg/dL (ref 8.9–10.3)
Chloride: 101 mmol/L (ref 98–111)
Creatinine: 0.66 mg/dL (ref 0.44–1.00)
GFR, Estimated: >60 mL/min (ref >60)
Glucose, Bld: 58 mg/dL — ABNORMAL LOW (ref 70–99)
Potassium: 4.1 mmol/L (ref 3.5–5.1)
Sodium: 136 mmol/L (ref 135–145)
Total Bilirubin: 0.5 mg/dL (ref 0.3–1.2)
Total Protein: 7.4 g/dL (ref 6.5–8.1)

## 2022-11-13 LAB — TSH: TSH: 3.642 u[IU]/mL (ref 0.350–4.500)

## 2022-11-13 LAB — CBC: RBC: 4.57 (ref 3.87–5.11)

## 2022-11-13 MED ORDER — CEPHALEXIN 500 MG PO CAPS: 500.0000 mg | ORAL_CAPSULE | Freq: Three times a day (TID) | ORAL | 0 refills | Status: DC

## 2022-11-13 NOTE — Progress Notes (Signed)
Wound on left shin that is not healing well.

## 2022-11-14 ENCOUNTER — Encounter: Payer: Self-pay | Admitting: Oncology

## 2022-11-14 LAB — T4: T4, Total: 7.8 ug/dL (ref 4.5–12.0)

## 2022-11-14 MED FILL — Pembrolizumab IV Soln 100 MG/4ML (25 MG/ML): INTRAVENOUS | Qty: 8 | Status: AC

## 2022-11-14 NOTE — Progress Notes (Unsigned)
Office Note     CC: Left lower extremity claudication Requesting Provider:  Eunice Blase, PA-C  HPI: Jonnette Stockett is a 57 y.o. ([birth year]) female presenting at the request of .O'Buch, Greta, PA-C with left lower extremity claudication.  On exam, Sianna was doing well.  A native of [place], she now lives in [place].  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  3 years ago, Jillianne was diagnosed with stage IVa lung adenocarcinoma.  She has undergone 35 rounds of Keytruda therapy.  She continues to live independently, without the need for home oxygen.  For the last 3 years she has had progressive claudication in the left lower extremity.  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  She is unable to shop at [employer] without stopping, and states it takes hours to mow her yard.  Notes 30 yards of ambulation prior to claudication.  Zacara wants to do everything possible to remain ambulatory, and would like to have her claudication fixed so that she can live an active, independent lifestyle fully understanding that her adenocarcinoma will likely result in future mortality.  Of note, she does not want to know her current life expectancy.  The pt is  on a statin for cholesterol management.  The pt is  on a daily aspirin.   Other AC:  - The pt is  on medication for hypertension.   The pt is not diabetic.  Tobacco hx:  current  Past Medical History:  Diagnosis Date   COPD (chronic obstructive pulmonary disease) (HCC)    Headache(784.0)    Hypertension    Malignant neoplasm of overlapping sites of right lung (HCC)    Malignant neoplasm of overlapping sites of right lung [facility])    Malignant neoplasm of overlapping sites of right lung (HCC)    Malignant neoplasm of overlapping sites of right lung (HCC)    Neck pain     Pneumonia    hx of    Past Surgical History:  Procedure Laterality Date   ANTERIOR CERVICAL DECOMP/DISCECTOMY FUSION  05/15/2011   Procedure: ANTERIOR CERVICAL DECOMPRESSION/DISCECTOMY FUSION 1 LEVEL;  Surgeon: Hewitt Shorts, MD;  Location: [facility];  Service: Neurosurgery;  Laterality: N/A;  Cervical four-five anterior cervical decompression with fusion plating and bonegraft   APPENDECTOMY     CERVICAL FUSION     X 2   POSTERIOR CERVICAL FUSION/FORAMINOTOMY N/A 01/13/2013   Procedure: CERVICAL FOUR TO SEVEN POSTERIOR CERVICAL FUSION/FORAMINOTOMY LEVEL 3;  Surgeon: Hewitt Shorts, MD;  Location: [facility];  Service: Neurosurgery;  Laterality: N/A;  C4-7 posterior cervical arthrodesis with instrumentation    Social History   Socioeconomic History   Marital status: Divorced    Spouse name: Not on file   Number of children: Not on file   Years of education: Not on file   Highest education level: Not on file  Occupational History   Not on file  Tobacco Use   Smoking status: Former    Current packs/day: 0.00    Average packs/day: 1 pack/day for 33.0 years (33.0 ttl pk-yrs)    Types: Cigarettes    Start date: 04/04/1978    Quit date: 04/04/2011    Years since quitting: 11.6   Smokeless tobacco: Never  Vaping Use   Vaping status: Unknown  Substance and Sexual Activity   Alcohol use: Yes    Alcohol/week: 4.0 - 5.0 standard drinks of alcohol    Types: 4 - 5 Cans of beer per week    Comment: Daily   Drug use: No   Sexual activity: Not on file  Other Topics Concern   Not on file  Social History Narrative   Not on file   Social Determinants of Health   Financial Resource Strain: Not on file  Food Insecurity: Not on file  Transportation Needs: Not on file  Physical Activity: Not on file  Stress: Not on file  Social Connections: Not on file  Intimate Partner Violence: Not on file   No family history on file.  Current Outpatient Medications  Medication Sig  Dispense Refill   acetylcysteine (MUCOMYST) 20 % nebulizer solution Take 4 mLs by nebulization 3 (three) times daily.     albuterol (VENTOLIN HFA) 108 (90 Base) MCG/ACT inhaler SMARTSIG:1-2 Puff(s) By Mouth Every 4-6 Hours PRN     amLODipine (NORVASC) 5 MG tablet Take 5 mg by mouth daily.     amoxicillin-clavulanate (AUGMENTIN) 875-125 MG tablet Take 1 tablet by mouth 2 (two) times daily. 10 tablet 0   atorvastatin (LIPITOR) 40 MG tablet Take 40 mg by mouth daily.     atorvastatin (LIPITOR) 80 MG tablet Take 1 tablet by mouth daily.     cephALEXin (KEFLEX) 500 MG capsule Take 1 capsule (500 mg total) by mouth 3 (three) times daily. 15 capsule 0   clobetasol ointment (TEMOVATE) 0.05 % Apply 1 Application topically 2 (two) times daily as needed.     Cyanocobalamin (VITAMIN B 12) 500 MCG TABS Take by mouth.     cyclobenzaprine (FLEXERIL) 10 MG tablet Take 1 tablet (10 mg total) by mouth 3 (three) times daily as needed for muscle spasms. 60 tablet 1   doxycycline (VIBRA-TABS) 100 MG tablet Take 1 tablet (100 mg total) by mouth 2 (two) times daily. 28 tablet 0   etodolac (LODINE) 500 MG tablet Take 500 mg by mouth 2 (two) times daily.     EUCRISA 2 % OINT Apply topically daily.     gabapentin (NEURONTIN) 100 MG capsule      HYDROcodone-acetaminophen (NORCO) 7.5-325 MG tablet Take 1 tablet by mouth every 6 (six) hours as needed for moderate pain. 60 tablet 0   ibuprofen (ADVIL,MOTRIN) 200 MG tablet Take 600 mg by mouth every 4 (four) hours as needed for moderate pain (neck).     losartan (COZAAR) 50 MG tablet Take 50 mg by mouth daily.     ondansetron (ZOFRAN) 4 MG tablet Take 4 mg by mouth every 4 (four) hours as needed for nausea or vomiting. (Patient not taking: Reported on 03/08/2021)     prochlorperazine (COMPAZINE) 10 MG tablet Take 10 mg by mouth every 6 (six) hours as needed for nausea or vomiting. (Patient not taking: Reported on 03/08/2021)     tiZANidine (ZANAFLEX) 2 MG tablet Take 2 mg by  mouth at bedtime. (Patient not taking: Reported on 12/26/2021)     TRELEGY ELLIPTA 100-62.5-25 MCG/ACT AEPB Take 1 puff by mouth daily.     valACYclovir (VALTREX) 1000 MG tablet Take 1 tablet (1,000 mg total) by mouth 2 (two) times daily. 14 tablet 0   No current facility-administered medications for this visit.    No Known Allergies   REVIEW OF SYSTEMS:   [X]  denotes positive finding, [ ]  denotes negative finding Cardiac  Comments:  Chest pain  or chest pressure:    Shortness of breath upon exertion:    Short of breath when lying flat:    Irregular heart rhythm:        Vascular    Pain in calf, thigh, or hip brought on by ambulation:    Pain in feet at night that wakes you up from your sleep:     Blood clot in your veins:    Leg swelling:         Pulmonary    Oxygen at home:    Productive cough:     Wheezing:         Neurologic    Sudden weakness in arms or legs:     Sudden numbness in arms or legs:     Sudden onset of difficulty speaking or slurred speech:    Temporary loss of vision in one eye:     Problems with dizziness:         Gastrointestinal    Blood in stool:     Vomited blood:         Genitourinary    Burning when urinating:     Blood in urine:        Psychiatric    Major depression:         Hematologic    Bleeding problems:    Problems with blood clotting too easily:        Skin    Rashes or ulcers:        Constitutional    Fever or chills:      PHYSICAL EXAMINATION:  There were no vitals filed for this visit.  General:  WDWN in NAD; vital signs documented above Gait: Not observed HENT: WNL, normocephalic Pulmonary: normal non-labored breathing , without wheezing Cardiac: regular HR Abdomen: soft, NT, no masses Skin: without rashes Vascular Exam/Pulses:  Right Left  Radial 2+ (normal) 2+ (normal)  Ulnar    Femoral 2+ (normal) absent  Popliteal    DP 2+ (normal) absent  PT     Extremities: without ischemic changes, without  Gangrene , without cellulitis; without open wounds;  Musculoskeletal: no muscle wasting or atrophy  Neurologic: A&O X 3;  No focal weakness or paresthesias are detected Psychiatric:  The pt has Normal affect.   Non-Invasive Vascular Imaging:    FINDINGS: Left lower Extremity  Inflow: Abnormal monophasic arterial waveforms. Large bulky calcified plaque present in the common femoral artery. The arterial waveform is visualized only in the distal segment and is abnormal with delayed systolic upstroke and blunting of the peak systolic velocity. Findings suggest a postobstructive appearance.  Outflow: Abnormal monophasic arterial waveforms.  Runoff: Abnormal monophasic arterial waveforms.  IMPRESSION: 1. 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 2. Abnormal monophasic arterial waveforms throughout the remaining left lower extremity arterial tree consistent with a postobstructive pattern.   ABI Findings:  +---------+------------------+-----+---------+--------+  Right   Rt Pressure (mmHg)IndexWaveform Comment   +---------+------------------+-----+---------+--------+  Brachial 152                                       +---------+------------------+-----+---------+--------+  PTA     162               1.07 triphasic          +---------+------------------+-----+---------+--------+  DP      166  1.09 triphasic          +---------+------------------+-----+---------+--------+  Great Toe106               0.70                    +---------+------------------+-----+---------+--------+   +---------+------------------+-----+----------+-------+  Left    Lt Pressure (mmHg)IndexWaveform  Comment  +---------+------------------+-----+----------+-------+  Brachial 145                                       +---------+------------------+-----+----------+-------+  PTA     85                 0.56 monophasic         +---------+------------------+-----+----------+-------+  DP      130               0.86 monophasic         +---------+------------------+-----+----------+-------+  Great Toe41                0.27                    +---------+------------------+-----+----------+-------+   ASSESSMENT/PLAN: Felissa Gabbert is a 57 y.o. female presenting with left lower extremity lifestyle-limiting claudication.  She is frustrated by the inability to shop and mow her yard without having to stop several times.  Per Bonita Quin, she is barely able to perform the duties of her job, and needs to continue working full-time.  Her diagnosis of stage IVa lung adenocarcinoma complicates her treatment algorithm.  She has had difficulty with infections in the past due to the multiple rounds of Keytruda.  I am concerned that any surgical procedure would have wound healing issues.  I had a long conversation with Bonita Quin regarding the above.  We discussed that with claudication, the best course of treatment is medical management, being a walking program and adjunct medication such as Pletal.  Leonita is walking a significant amount as she continues to work full-time.  I plan to prescribe Pletal for the next 3 months and assess her improvement at that time I have also ordered a CT angio abdomen pelvis with runoff as she has a nonpalpable left femoral pulse. Imaging demonstrates severe calcific disease in the common femoral artery, however the left-sided iliac system was not assessed.    Should she continue to have severe, lifestyle-limiting claudication at her next visit, we would discuss possible intervention.  This would be pending CTA, as well as a discussion with her oncologist regarding the effects of Keytruda on wound healing.  I made it very clear that my goal is to help her as much as possible with as little risk as possible.  I asked that she call my office should new onset rest  pain or tissue loss develop. I will see her in 3 months.   Victorino Sparrow, MD [facility] [phone] Total time of patient care including pre-visit research, consultation, and documentation greater than 45 minutes

## 2022-11-16 ENCOUNTER — Encounter: Payer: Self-pay | Admitting: Vascular Surgery

## 2022-11-16 ENCOUNTER — Ambulatory Visit: Payer: Federal, State, Local not specified - PPO

## 2022-11-16 ENCOUNTER — Ambulatory Visit: Payer: Federal, State, Local not specified - PPO | Admitting: Vascular Surgery

## 2022-11-16 ENCOUNTER — Inpatient Hospital Stay: Payer: Federal, State, Local not specified - PPO

## 2022-11-16 ENCOUNTER — Ambulatory Visit (HOSPITAL_COMMUNITY)
Admission: RE | Admit: 2022-11-16 | Discharge: 2022-11-16 | Disposition: A | Discharge status: Home / Self Care | Payer: Federal, State, Local not specified - PPO | Source: Ambulatory Visit | Attending: Vascular Surgery | Admitting: Vascular Surgery

## 2022-11-16 VITALS — BP 128/77 | HR 66 | Temp 97.9°F | Resp 20 | Ht 65.0 in | Wt 103.7 lb

## 2022-11-16 VITALS — BP 122/63 | HR 77 | Temp 97.9°F | Resp 17 | Wt 103.0 lb

## 2022-11-16 DIAGNOSIS — C3481 Malignant neoplasm of overlapping sites of right bronchus and lung: Secondary | ICD-10-CM

## 2022-11-16 DIAGNOSIS — Z5112 Encounter for antineoplastic immunotherapy: Secondary | ICD-10-CM | POA: Diagnosis not present

## 2022-11-16 DIAGNOSIS — Z79620 Long term (current) use of immunosuppressive biologic: Secondary | ICD-10-CM | POA: Diagnosis not present

## 2022-11-16 DIAGNOSIS — I739 Peripheral vascular disease, unspecified: Secondary | ICD-10-CM | POA: Insufficient documentation

## 2022-11-16 LAB — VAS US ABI WITH/WO TBI
Left ABI: 0.86
Right ABI: 1.09

## 2022-11-16 MED ORDER — HEPARIN SOD (PORK) LOCK FLUSH 100 UNIT/ML IV SOLN
500.0000 [IU] | Freq: Once | INTRAVENOUS | Status: AC | PRN
  Administered 2022-11-16: 500 [IU]

## 2022-11-16 MED ORDER — CILOSTAZOL 100 MG PO TABS: 100.0000 mg | ORAL_TABLET | Freq: Two times a day (BID) | ORAL | 11 refills | Status: DC

## 2022-11-16 MED ORDER — SODIUM CHLORIDE 0.9% FLUSH
10.0000 mL | INTRAVENOUS | Status: DC | PRN
  Administered 2022-11-16: 10 mL

## 2022-11-16 MED ORDER — PEMBROLIZUMAB CHEMO INJECTION 100 MG/4ML
200.0000 mg | Freq: Once | INTRAVENOUS | Status: AC
  Administered 2022-11-16: 200 mg via INTRAVENOUS
  Filled 2022-11-16: qty 8

## 2022-11-16 MED ORDER — SODIUM CHLORIDE 0.9 % IV SOLN: Freq: Once | INTRAVENOUS | Status: AC

## 2022-11-16 NOTE — Patient Instructions (Signed)

## 2022-11-16 NOTE — Addendum Note (Signed)
Addended by: Gerarda Fraction E on: 11/16/2022 01:03 PM   Modules accepted: Orders

## 2022-11-21 ENCOUNTER — Other Ambulatory Visit: Payer: Self-pay | Admitting: Oncology

## 2022-11-21 MED ORDER — CEPHALEXIN 500 MG PO CAPS: 500.0000 mg | ORAL_CAPSULE | Freq: Three times a day (TID) | ORAL | 0 refills | Status: DC

## 2022-12-05 ENCOUNTER — Other Ambulatory Visit: Payer: Self-pay | Admitting: Oncology

## 2022-12-05 DIAGNOSIS — C3481 Malignant neoplasm of overlapping sites of right bronchus and lung: Secondary | ICD-10-CM

## 2022-12-05 DIAGNOSIS — S129XXS Fracture of neck, unspecified, sequela: Secondary | ICD-10-CM

## 2022-12-05 DIAGNOSIS — R0789 Other chest pain: Secondary | ICD-10-CM

## 2022-12-05 DIAGNOSIS — M5030 Other cervical disc degeneration, unspecified cervical region: Secondary | ICD-10-CM

## 2022-12-05 MED ORDER — HYDROCODONE-ACETAMINOPHEN 7.5-325 MG PO TABS: 1.0000 | ORAL_TABLET | Freq: Four times a day (QID) | ORAL | 0 refills | Status: DC | PRN

## 2022-12-06 DIAGNOSIS — L03116 Cellulitis of left lower limb: Secondary | ICD-10-CM | POA: Diagnosis not present

## 2022-12-06 DIAGNOSIS — M79662 Pain in left lower leg: Secondary | ICD-10-CM | POA: Diagnosis not present

## 2022-12-08 ENCOUNTER — Other Ambulatory Visit: Payer: Self-pay | Admitting: Oncology

## 2022-12-13 DIAGNOSIS — S81802A Unspecified open wound, left lower leg, initial encounter: Secondary | ICD-10-CM | POA: Diagnosis not present

## 2022-12-13 DIAGNOSIS — Z7969 Long term (current) use of other immunomodulators and immunosuppressants: Secondary | ICD-10-CM | POA: Diagnosis not present

## 2022-12-13 DIAGNOSIS — L97921 Non-pressure chronic ulcer of unspecified part of left lower leg limited to breakdown of skin: Secondary | ICD-10-CM | POA: Diagnosis not present

## 2022-12-13 DIAGNOSIS — L03116 Cellulitis of left lower limb: Secondary | ICD-10-CM | POA: Diagnosis not present

## 2022-12-13 DIAGNOSIS — C3490 Malignant neoplasm of unspecified part of unspecified bronchus or lung: Secondary | ICD-10-CM | POA: Diagnosis not present

## 2022-12-20 DIAGNOSIS — C3481 Malignant neoplasm of overlapping sites of right bronchus and lung: Secondary | ICD-10-CM | POA: Diagnosis not present

## 2022-12-20 LAB — COMPREHENSIVE METABOLIC PANEL
Albumin: 4.1 (ref 3.5–5.0)
Calcium: 8.9 (ref 8.7–10.7)
eGFR: >60

## 2022-12-20 LAB — BASIC METABOLIC PANEL
BUN: 9 (ref 4–21)
CO2: 23 — AB (ref 13–22)
Chloride: 101 (ref 99–108)
Creatinine: 0.6 (ref 0.5–1.1)
Glucose: 81
Potassium: 4 meq/L (ref 3.5–5.1)
Sodium: 131 — AB (ref 137–147)

## 2022-12-20 LAB — CBC AND DIFFERENTIAL
HCT: 40 (ref 36–46)
Hemoglobin: 13.8 (ref 12.0–16.0)
Neutrophils Absolute: 2.91
Platelets: 288 10*3/uL (ref 150–400)
WBC: 4.7

## 2022-12-20 LAB — CBC: RBC: 4.23 (ref 3.87–5.11)

## 2022-12-20 LAB — HEPATIC FUNCTION PANEL
ALT: 23 U/L (ref 7–35)
AST: 31 (ref 13–35)
Alkaline Phosphatase: 46 (ref 25–125)
Bilirubin, Total: 0.3

## 2022-12-25 ENCOUNTER — Inpatient Hospital Stay: Payer: Federal, State, Local not specified - PPO | Attending: Hematology and Oncology | Admitting: Oncology

## 2022-12-26 ENCOUNTER — Encounter: Payer: Self-pay | Admitting: Oncology

## 2022-12-26 NOTE — Progress Notes (Unsigned)
[facility]  [address] [place],  [place]  [address] [phone]  Clinic Day:  12/27/2022  Referring physician: Eunice Blase, PA-C   HISTORY OF PRESENT ILLNESS:  The patient is a 57 y.o. female with stage IVA lung adenocarcinoma.  This staging is based upon initial scans showing bilateral lung involvement with her cancer.  She comes in today to go over her CT scans to ascertain her new disease baseline after receiving 35 cycles of maintenance pembrolizumab immunotherapy.  The patient claims to have tolerated her last cycle of treatment fairly well.  From a lung cancer standpoint, she denies having any new respiratory symptoms which concern her for disease progression.     PHYSICAL EXAM:  Blood pressure 132/86, pulse 81, temperature 97.7 F (36.5 C), resp. rate 16, height 5\' 5"  (1.651 m), weight 106 lb 12.8 oz (48.4 kg), SpO2 97%. Wt Readings from Last 3 Encounters:  12/27/22 106 lb 12.8 oz (48.4 kg)  11/16/22 103 lb 0.6 oz (46.7 kg)  11/16/22 103 lb 11.2 oz (47 kg)   Body mass index is 17.77 kg/m. Performance status (ECOG): 1 - Symptomatic but completely ambulatory Physical Exam Constitutional:      Appearance: Normal appearance. She is not ill-appearing.  HENT:     Mouth/Throat:     Mouth: Mucous membranes are moist.     Pharynx: Oropharynx is clear. No oropharyngeal exudate or posterior oropharyngeal erythema.  Cardiovascular:     Rate and Rhythm: Normal rate and regular rhythm.     Heart sounds: No murmur heard.    No friction rub. No gallop.  Pulmonary:     Effort: Pulmonary effort is normal. No respiratory distress.     Breath sounds: Decreased breath sounds present. No wheezing, rhonchi or rales.  Abdominal:     General: Bowel sounds are normal. There is no distension.     Palpations: Abdomen is soft. There is no mass.     Tenderness: There is no abdominal tenderness.  Musculoskeletal:        General: No swelling.     Right  lower leg: No edema.     Left lower leg: No edema.  Lymphadenopathy:     Cervical: No cervical adenopathy.     Upper Body:     Right upper body: No supraclavicular or axillary adenopathy.     Left upper body: No supraclavicular or axillary adenopathy.     Lower Body: No right inguinal adenopathy. No left inguinal adenopathy.  Skin:    General: Skin is warm.     Coloration: Skin is not jaundiced.     Findings: No lesion or rash.  Neurological:     General: No focal deficit present.     Mental Status: She is alert and oriented to person, place, and time. Mental status is at baseline.  Psychiatric:        Mood and Affect: Mood normal.        Behavior: Behavior normal.        Thought Content: Thought content normal.   SCANS: Her chest CT revealed the following: FINDINGS: Cardiovascular: The heart is normal in size. There is no pericardial effusion.  Aorta: The aorta is normal in caliber. No aortic aneurysm is identified. Severe coronary artery calcifications.  Mediastinum: Mediastinal lymphadenopathy. Right greater than left supraclavicular lymphadenopathy.  Lungs: Severe emphysema. No consolidation. Multiple bilateral pulmonary nodules, the largest in the right lower lobe measuring 7 mm.  Pleura: No pleural effusions or pneumothorax  is present.  Chest wall: Unremarkable.  Upper abdomen: The partially imaged upper abdominal organs are unremarkable.  Osseous: Unremarkable.  IMPRESSION: Multiple bilateral pulmonary nodules, the largest in the right lower lobe measuring 7 mm. Mediastinal and right greater than left supraclavicular lymphadenopathy. Reported history of neoplasm. Correlate clinically.  Severe emphysema.  LABS:      Latest Ref Rng & Units 12/20/2022   12:00 AM 11/13/2022   12:00 AM 10/02/2022   12:00 AM  CBC  WBC  4.7     8.7     7.1      Hemoglobin 12.0 - 16.0 13.8     14.9     15.1      Hematocrit 36 - 46 40     43     44      Platelets 150 - 400 K/uL 288      309     279         This result is from an external source.      Latest Ref Rng & Units 12/20/2022   12:00 AM 11/13/2022    9:17 AM 10/02/2022    9:12 AM  CMP  Glucose 70 - 99 mg/dL  58  69   BUN 4 - 21 9     11  10    Creatinine 0.5 - 1.1 0.6     0.66  0.72   Sodium 137 - 147 131     136  138   Potassium 3.5 - 5.1 mEq/L 4.0     4.1  3.7   Chloride 99 - 108 101     101  102   CO2 13 - 22 23     25  25    Calcium 8.7 - 10.7 8.9     9.3  9.2   Total Protein 6.5 - 8.1 g/dL  7.4  7.4   Total Bilirubin 0.3 - 1.2 mg/dL  0.5  0.7   Alkaline Phos 25 - 125 46     43  48   AST 13 - 35 31     20  19    ALT 7 - 35 U/L 23     14  17       This result is from an external source.   ASSESSMENT & PLAN:  Assessment/Plan:  A 57 y.o. female with stage IVA lung adenocarcinoma.  In clinic today, I went over all of her CT scan images with her, for which she could see her disease remains under ideal control.  She does have chronic small pulmonary nodules which have not changed in size.  They will continue to be followed over time.  Otherwise, she will proceed with her 36th cycle of pembrolizumab today.  Clinically, the patient is doing well.  I will see her back in 6 weeks before she heads into her 37th cycle of pembrolizumab immunotherapy.  The patient understands all the plans discussed today and is in agreement with them.    Hayzlee Mcsorley Kirby Funk, MD

## 2022-12-27 ENCOUNTER — Inpatient Hospital Stay: Payer: Federal, State, Local not specified - PPO

## 2022-12-27 ENCOUNTER — Inpatient Hospital Stay: Payer: Federal, State, Local not specified - PPO | Attending: Hematology and Oncology | Admitting: Oncology

## 2022-12-27 ENCOUNTER — Encounter: Payer: Self-pay | Admitting: Oncology

## 2022-12-27 VITALS — BP 132/86 | HR 81 | Temp 97.7°F | Resp 16 | Ht 65.0 in | Wt 106.8 lb

## 2022-12-27 DIAGNOSIS — C3481 Malignant neoplasm of overlapping sites of right bronchus and lung: Secondary | ICD-10-CM | POA: Insufficient documentation

## 2022-12-27 DIAGNOSIS — Z5112 Encounter for antineoplastic immunotherapy: Secondary | ICD-10-CM | POA: Diagnosis not present

## 2022-12-27 MED ORDER — SODIUM CHLORIDE 0.9% FLUSH
10.0000 mL | INTRAVENOUS | Status: DC | PRN
  Administered 2022-12-27: 10 mL

## 2022-12-27 MED ORDER — SODIUM CHLORIDE 0.9 % IV SOLN: Freq: Once | INTRAVENOUS | Status: AC

## 2022-12-27 MED ORDER — HEPARIN SOD (PORK) LOCK FLUSH 100 UNIT/ML IV SOLN
500.0000 [IU] | Freq: Once | INTRAVENOUS | Status: AC | PRN
  Administered 2022-12-27: 500 [IU]

## 2022-12-27 MED ORDER — SODIUM CHLORIDE 0.9 % IV SOLN
200.0000 mg | Freq: Once | INTRAVENOUS | Status: AC
  Administered 2022-12-27: 200 mg via INTRAVENOUS
  Filled 2022-12-27: qty 8

## 2022-12-27 NOTE — Patient Instructions (Signed)

## 2023-01-01 ENCOUNTER — Ambulatory Visit: Payer: Federal, State, Local not specified - PPO

## 2023-01-05 ENCOUNTER — Other Ambulatory Visit: Payer: Self-pay | Admitting: Oncology

## 2023-01-05 DIAGNOSIS — C3481 Malignant neoplasm of overlapping sites of right bronchus and lung: Secondary | ICD-10-CM

## 2023-01-05 DIAGNOSIS — S129XXS Fracture of neck, unspecified, sequela: Secondary | ICD-10-CM

## 2023-01-05 DIAGNOSIS — R0789 Other chest pain: Secondary | ICD-10-CM

## 2023-01-05 DIAGNOSIS — M5030 Other cervical disc degeneration, unspecified cervical region: Secondary | ICD-10-CM

## 2023-01-10 ENCOUNTER — Encounter: Payer: Self-pay | Admitting: Oncology

## 2023-01-11 DIAGNOSIS — X58XXXA Exposure to other specified factors, initial encounter: Secondary | ICD-10-CM | POA: Diagnosis not present

## 2023-01-11 DIAGNOSIS — S80812A Abrasion, left lower leg, initial encounter: Secondary | ICD-10-CM | POA: Diagnosis not present

## 2023-01-18 ENCOUNTER — Other Ambulatory Visit: Payer: Self-pay

## 2023-01-18 DIAGNOSIS — I739 Peripheral vascular disease, unspecified: Secondary | ICD-10-CM

## 2023-01-29 DIAGNOSIS — X58XXXA Exposure to other specified factors, initial encounter: Secondary | ICD-10-CM | POA: Diagnosis not present

## 2023-01-29 DIAGNOSIS — S80812D Abrasion, left lower leg, subsequent encounter: Secondary | ICD-10-CM | POA: Diagnosis not present

## 2023-01-29 DIAGNOSIS — S80812A Abrasion, left lower leg, initial encounter: Secondary | ICD-10-CM | POA: Diagnosis not present

## 2023-02-01 ENCOUNTER — Encounter: Payer: Self-pay | Admitting: Oncology

## 2023-02-04 NOTE — Progress Notes (Signed)
 [facility]  [address] [place],  [place]  [address] [phone]  Clinic Day:  02/05/2023  Referring physician: Venancio Pock, PA-C   HISTORY OF PRESENT ILLNESS:  The patient is a 58 y.o. female with stage IVA lung adenocarcinoma.  This staging is based upon initial scans showing bilateral lung involvement with her cancer.  She comes in today to be evaluated before heading into her 37th cycle of maintenance pembrolizumab  immunotherapy.  The patient claims to have tolerated her last cycle of treatment fairly well.  From a lung cancer standpoint, she denies having any new respiratory symptoms which concern her for disease progression.     PHYSICAL EXAM:  Blood pressure (!) 105/58, pulse 98, temperature 98.1 F (36.7 C), temperature source Oral, resp. rate 18, height 5' 5 (1.651 m), weight 109 lb (49.4 kg), SpO2 99%. Wt Readings from Last 3 Encounters:  02/05/23 109 lb (49.4 kg)  12/27/22 106 lb 12.8 oz (48.4 kg)  11/16/22 103 lb 0.6 oz (46.7 kg)   Body mass index is 18.14 kg/m. Performance status (ECOG): 1 - Symptomatic but completely ambulatory Physical Exam Constitutional:      Appearance: Normal appearance. She is not ill-appearing.  HENT:     Mouth/Throat:     Mouth: Mucous membranes are moist.     Pharynx: Oropharynx is clear. No oropharyngeal exudate or posterior oropharyngeal erythema.  Cardiovascular:     Rate and Rhythm: Normal rate and regular rhythm.     Heart sounds: No murmur heard.    No friction rub. No gallop.  Pulmonary:     Effort: Pulmonary effort is normal. No respiratory distress.     Breath sounds: Decreased breath sounds present. No wheezing, rhonchi or rales.  Abdominal:     General: Bowel sounds are normal. There is no distension.     Palpations: Abdomen is soft. There is no mass.     Tenderness: There is no abdominal tenderness.  Musculoskeletal:        General: No swelling.     Right lower leg: No edema.      Left lower leg: No edema.  Lymphadenopathy:     Cervical: No cervical adenopathy.     Upper Body:     Right upper body: No supraclavicular or axillary adenopathy.     Left upper body: No supraclavicular or axillary adenopathy.     Lower Body: No right inguinal adenopathy. No left inguinal adenopathy.  Skin:    General: Skin is warm.     Coloration: Skin is not jaundiced.     Findings: No lesion or rash.  Neurological:     General: No focal deficit present.     Mental Status: She is alert and oriented to person, place, and time. Mental status is at baseline.  Psychiatric:        Mood and Affect: Mood normal.        Behavior: Behavior normal.        Thought Content: Thought content normal.    LABS:      Latest Ref Rng & Units 02/05/2023    9:20 AM 12/20/2022   12:00 AM 11/13/2022   12:00 AM  CBC  WBC 4.0 - 10.5 K/uL 7.6  4.7     8.7      Hemoglobin 12.0 - 15.0 g/dL 86.4  86.1     85.0      Hematocrit 36.0 - 46.0 % 39.8  40     43  Platelets 150 - 400 K/uL 301  288     309         This result is from an external source.      Latest Ref Rng & Units 02/05/2023    9:20 AM 12/20/2022   12:00 AM 11/13/2022    9:17 AM  CMP  Glucose 70 - 99 mg/dL 890   58   BUN 6 - 20 mg/dL 11  9     11    Creatinine 0.44 - 1.00 mg/dL 9.29  0.6     9.33   Sodium 135 - 145 mmol/L 134  131     136   Potassium 3.5 - 5.1 mmol/L 3.9  4.0     4.1   Chloride 98 - 111 mmol/L 99  101     101   CO2 22 - 32 mmol/L 24  23     25    Calcium 8.9 - 10.3 mg/dL 9.1  8.9     9.3   Total Protein 6.5 - 8.1 g/dL 6.3   7.4   Total Bilirubin 0.0 - 1.2 mg/dL 0.3   0.5   Alkaline Phos 38 - 126 U/L 55  46     43   AST 15 - 41 U/L 20  31     20    ALT 0 - 44 U/L 17  23     14       This result is from an external source.   ASSESSMENT & PLAN:  Assessment/Plan:  A 58 y.o. female with stage IVA lung adenocarcinoma.  She will proceed with her 37th cycle of pembrolizumab  today.  Clinically, the patient is doing well.   I will see her back in 6 weeks before she heads into her 38th cycle of pembrolizumab  immunotherapy.  The patient understands all the plans discussed today and is in agreement with them.    Fielding Mault DELENA Kerns, MD

## 2023-02-05 ENCOUNTER — Encounter: Payer: Self-pay | Admitting: Oncology

## 2023-02-05 ENCOUNTER — Inpatient Hospital Stay: Payer: Federal, State, Local not specified - PPO

## 2023-02-05 ENCOUNTER — Inpatient Hospital Stay: Payer: Federal, State, Local not specified - PPO | Attending: Hematology and Oncology

## 2023-02-05 ENCOUNTER — Inpatient Hospital Stay: Payer: Federal, State, Local not specified - PPO | Admitting: Oncology

## 2023-02-05 VITALS — BP 105/58 | HR 98 | Temp 98.1°F | Resp 18 | Ht 65.0 in | Wt 109.0 lb

## 2023-02-05 DIAGNOSIS — C3481 Malignant neoplasm of overlapping sites of right bronchus and lung: Secondary | ICD-10-CM

## 2023-02-05 DIAGNOSIS — Z79620 Long term (current) use of immunosuppressive biologic: Secondary | ICD-10-CM | POA: Insufficient documentation

## 2023-02-05 DIAGNOSIS — Z5112 Encounter for antineoplastic immunotherapy: Secondary | ICD-10-CM | POA: Insufficient documentation

## 2023-02-05 LAB — CBC WITH DIFFERENTIAL (CANCER CENTER ONLY)
Abs Immature Granulocytes: 0.03 10*3/uL (ref 0.00–0.07)
Basophils Absolute: 0.1 10*3/uL (ref 0.0–0.1)
Basophils Relative: 1 %
Eosinophils Absolute: 0.2 10*3/uL (ref 0.0–0.5)
Eosinophils Relative: 2 %
HCT: 39.8 % (ref 36.0–46.0)
Hemoglobin: 13.5 g/dL (ref 12.0–15.0)
Immature Granulocytes: 0 %
Lymphocytes Relative: 18 %
Lymphs Abs: 1.3 10*3/uL (ref 0.7–4.0)
MCH: 31.9 pg (ref 26.0–34.0)
MCHC: 33.9 g/dL (ref 30.0–36.0)
MCV: 94.1 fL (ref 80.0–100.0)
Monocytes Absolute: 0.5 10*3/uL (ref 0.1–1.0)
Monocytes Relative: 7 %
Neutro Abs: 5.4 10*3/uL (ref 1.7–7.7)
Neutrophils Relative %: 72 %
Platelet Count: 301 10*3/uL (ref 150–400)
RBC: 4.23 MIL/uL (ref 3.87–5.11)
RDW: 12.6 % (ref 11.5–15.5)
WBC Count: 7.6 10*3/uL (ref 4.0–10.5)
nRBC: 0 % (ref 0.0–0.2)
nRBC: 0 /100{WBCs}

## 2023-02-05 LAB — CMP (CANCER CENTER ONLY)
ALT: 17 U/L (ref 0–44)
AST: 20 U/L (ref 15–41)
Albumin: 4.3 g/dL (ref 3.5–5.0)
Alkaline Phosphatase: 55 U/L (ref 38–126)
Anion gap: 11 (ref 5–15)
BUN: 11 mg/dL (ref 6–20)
CO2: 24 mmol/L (ref 22–32)
Calcium: 9.1 mg/dL (ref 8.9–10.3)
Chloride: 99 mmol/L (ref 98–111)
Creatinine: 0.7 mg/dL (ref 0.44–1.00)
GFR, Estimated: >60 mL/min (ref >60)
Glucose, Bld: 109 mg/dL — ABNORMAL HIGH (ref 70–99)
Potassium: 3.9 mmol/L (ref 3.5–5.1)
Sodium: 134 mmol/L — ABNORMAL LOW (ref 135–145)
Total Bilirubin: 0.3 mg/dL (ref 0.0–1.2)
Total Protein: 6.3 g/dL — ABNORMAL LOW (ref 6.5–8.1)

## 2023-02-05 LAB — TSH: TSH: 2.78 u[IU]/mL (ref 0.350–4.500)

## 2023-02-05 MED ORDER — SODIUM CHLORIDE 0.9 % IV SOLN: Freq: Once | INTRAVENOUS | Status: AC

## 2023-02-05 MED ORDER — HEPARIN SOD (PORK) LOCK FLUSH 100 UNIT/ML IV SOLN
500.0000 [IU] | Freq: Once | INTRAVENOUS | Status: AC | PRN
  Administered 2023-02-05: 500 [IU]

## 2023-02-05 MED ORDER — SODIUM CHLORIDE 0.9% FLUSH
10.0000 mL | INTRAVENOUS | Status: DC | PRN
  Administered 2023-02-05: 10 mL

## 2023-02-05 MED ORDER — SODIUM CHLORIDE 0.9 % IV SOLN
200.0000 mg | Freq: Once | INTRAVENOUS | Status: AC
  Administered 2023-02-05: 200 mg via INTRAVENOUS
  Filled 2023-02-05: qty 8

## 2023-02-05 NOTE — Patient Instructions (Signed)

## 2023-02-06 LAB — T4: T4, Total: 7 ug/dL (ref 4.5–12.0)

## 2023-02-07 DIAGNOSIS — L209 Atopic dermatitis, unspecified: Secondary | ICD-10-CM | POA: Diagnosis not present

## 2023-02-07 DIAGNOSIS — L570 Actinic keratosis: Secondary | ICD-10-CM | POA: Diagnosis not present

## 2023-02-12 DIAGNOSIS — M5416 Radiculopathy, lumbar region: Secondary | ICD-10-CM | POA: Diagnosis not present

## 2023-02-12 DIAGNOSIS — E785 Hyperlipidemia, unspecified: Secondary | ICD-10-CM | POA: Diagnosis not present

## 2023-02-12 DIAGNOSIS — Z79899 Other long term (current) drug therapy: Secondary | ICD-10-CM | POA: Diagnosis not present

## 2023-02-12 DIAGNOSIS — I10 Essential (primary) hypertension: Secondary | ICD-10-CM | POA: Diagnosis not present

## 2023-02-19 ENCOUNTER — Ambulatory Visit
Admission: RE | Admit: 2023-02-19 | Discharge: 2023-02-19 | Disposition: A | Discharge status: Home / Self Care | Payer: Federal, State, Local not specified - PPO | Source: Ambulatory Visit | Attending: Vascular Surgery

## 2023-02-19 ENCOUNTER — Encounter: Payer: Self-pay | Admitting: Oncology

## 2023-02-19 DIAGNOSIS — I700 Atherosclerosis of aorta: Secondary | ICD-10-CM | POA: Diagnosis not present

## 2023-02-19 DIAGNOSIS — I739 Peripheral vascular disease, unspecified: Secondary | ICD-10-CM

## 2023-02-19 DIAGNOSIS — I701 Atherosclerosis of renal artery: Secondary | ICD-10-CM | POA: Diagnosis not present

## 2023-02-19 DIAGNOSIS — I70213 Atherosclerosis of native arteries of extremities with intermittent claudication, bilateral legs: Secondary | ICD-10-CM | POA: Diagnosis not present

## 2023-02-19 MED ORDER — IOPAMIDOL (ISOVUE-370) INJECTION 76%
500.0000 mL | Freq: Once | INTRAVENOUS | Status: AC | PRN
  Administered 2023-02-19: 120 mL via INTRAVENOUS

## 2023-02-21 NOTE — Progress Notes (Unsigned)
Office Note     HPI: Heather Gutierrez is a 58 y.o. (1965/04/30) female presenting with known left lower extremity claudication.  A native of [place], she now lives in [place].  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  3 years ago, Heather Gutierrez was diagnosed with stage IVa lung adenocarcinoma.  She has undergone 37+ rounds of Keytruda therapy.  She continues to live independently, without the need for home oxygen.  For the last 3 years she has had progressive claudication in the left lower extremity.  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  She was unable to shop at [employer] without taking several breaks, or mow her yard.  She stated claudication began roughly 30 yards.  We elected for continued medical management, and added Pletal to her medication regimen.   On exam today, Heather Gutierrez was doing well.  Since last seen, she has had improvement in her claudication symptoms.  She still notes claudication, but this occurs after a longer distance.  She denies lower extremity rest pain, tissue loss.  She continues treatment for adenocarcinoma, and states her malignancy is stable.  The pt is  on a statin for cholesterol management.  The pt is  on a daily aspirin.   Other AC:  - The pt is  on medication for hypertension.   The pt is not diabetic.  Tobacco hx:  current  Past Medical History:  Diagnosis Date   COPD (chronic obstructive pulmonary disease) (HCC)    Headache(784.0)    Hypertension    Malignant neoplasm of overlapping sites of right lung (HCC)    Malignant neoplasm of overlapping sites of right lung [facility])    Malignant neoplasm of overlapping sites of right lung (HCC)    Malignant neoplasm of overlapping sites of right lung (HCC)    Neck pain    Pneumonia    hx of    Past Surgical History:   Procedure Laterality Date   ANTERIOR CERVICAL DECOMP/DISCECTOMY FUSION  05/15/2011   Procedure: ANTERIOR CERVICAL DECOMPRESSION/DISCECTOMY FUSION 1 LEVEL;  Surgeon: Hewitt Shorts, MD;  Location: [facility];  Service: Neurosurgery;  Laterality: N/A;  Cervical four-five anterior cervical decompression with fusion plating and bonegraft   APPENDECTOMY     CERVICAL FUSION     X 2   POSTERIOR CERVICAL FUSION/FORAMINOTOMY N/A 01/13/2013   Procedure: CERVICAL FOUR TO SEVEN POSTERIOR CERVICAL FUSION/FORAMINOTOMY LEVEL 3;  Surgeon: Hewitt Shorts, MD;  Location: [facility];  Service: Neurosurgery;  Laterality: N/A;  C4-7 posterior cervical arthrodesis with instrumentation    Social History   Socioeconomic History   Marital status: Divorced    Spouse name: Not on file   Number of children: Not on file   Years of education: Not on file   Highest education level: Not on file  Occupational History   Not on file  Tobacco Use   Smoking status: Former    Current packs/day: 0.00    Average packs/day: 1 pack/day for 33.0 years (33.0 ttl pk-yrs)    Types: Cigarettes    Start date: 04/04/1978    Quit date: 04/04/2011    Years since quitting: 11.8   Smokeless tobacco: Never  Vaping Use   Vaping status: Unknown  Substance and Sexual Activity   Alcohol use: Yes  Alcohol/week: 4.0 - 5.0 standard drinks of alcohol    Types: 4 - 5 Cans of beer per week    Comment: Daily   Drug use: No   Sexual activity: Not on file  Other Topics Concern   Not on file  Social History Narrative   Not on file   Social Drivers of Health   Financial Resource Strain: Not on file  Food Insecurity: Not on file  Transportation Needs: Not on file  Physical Activity: Not on file  Stress: Not on file  Social Connections: Not on file  Intimate Partner Violence: Not on file   No family history on file.  Current Outpatient Medications  Medication Sig Dispense Refill   acetylcysteine (MUCOMYST) 20 %  nebulizer solution Take 4 mLs by nebulization 3 (three) times daily. Taking as needed     albuterol (VENTOLIN HFA) 108 (90 Base) MCG/ACT inhaler SMARTSIG:1-2 Puff(s) By Mouth Every 4-6 Hours PRN     amLODipine (NORVASC) 5 MG tablet Take 5 mg by mouth daily.     atorvastatin (LIPITOR) 40 MG tablet Take 40 mg by mouth daily.     cilostazol (PLETAL) 100 MG tablet Take 1 tablet (100 mg total) by mouth 2 (two) times daily before a meal. 60 tablet 11   clobetasol ointment (TEMOVATE) 0.05 % Apply 1 Application topically 2 (two) times daily as needed.     cyclobenzaprine (FLEXERIL) 10 MG tablet Take 1 tablet (10 mg total) by mouth 3 (three) times daily as needed for muscle spasms. 60 tablet 1   EUCRISA 2 % OINT Apply topically daily.     gabapentin (NEURONTIN) 100 MG capsule      ibuprofen (ADVIL,MOTRIN) 200 MG tablet Take 600 mg by mouth every 4 (four) hours as needed for moderate pain (neck).     losartan (COZAAR) 50 MG tablet Take 50 mg by mouth daily.     mupirocin ointment (BACTROBAN) 2 % Apply 1 Application topically 4 (four) times daily.     prochlorperazine (COMPAZINE) 10 MG tablet Take 10 mg by mouth every 6 (six) hours as needed for nausea or vomiting.     TRELEGY ELLIPTA 100-62.5-25 MCG/ACT AEPB Take 1 puff by mouth daily.     valACYclovir (VALTREX) 1000 MG tablet Take 1 tablet (1,000 mg total) by mouth 2 (two) times daily. 14 tablet 0   No current facility-administered medications for this visit.    No Known Allergies   REVIEW OF SYSTEMS:   [X]  denotes positive finding, [ ]  denotes negative finding Cardiac  Comments:  Chest pain or chest pressure:    Shortness of breath upon exertion:    Short of breath when lying flat:    Irregular heart rhythm:        Vascular    Pain in calf, thigh, or hip brought on by ambulation:    Pain in feet at night that wakes you up from your sleep:     Blood clot in your veins:    Leg swelling:         Pulmonary    Oxygen at home:    Productive  cough:     Wheezing:         Neurologic    Sudden weakness in arms or legs:     Sudden numbness in arms or legs:     Sudden onset of difficulty speaking or slurred speech:    Temporary loss of vision in one eye:     Problems with dizziness:  Gastrointestinal    Blood in stool:     Vomited blood:         Genitourinary    Burning when urinating:     Blood in urine:        Psychiatric    Major depression:         Hematologic    Bleeding problems:    Problems with blood clotting too easily:        Skin    Rashes or ulcers:        Constitutional    Fever or chills:      PHYSICAL EXAMINATION:  There were no vitals filed for this visit.  General:  WDWN in NAD; vital signs documented above Gait: Not observed HENT: WNL, normocephalic Pulmonary: normal non-labored breathing , without wheezing Cardiac: regular HR Abdomen: soft, NT, no masses Skin: without rashes Vascular Exam/Pulses:  Right Left  Radial 2+ (normal) 2+ (normal)  Ulnar    Femoral 2+ (normal) absent  Popliteal    DP 2+ (normal) absent  PT     Extremities: without ischemic changes, without Gangrene , without cellulitis; without open wounds;  Musculoskeletal: no muscle wasting or atrophy  Neurologic: A&O X 3;  No focal weakness or paresthesias are detected Psychiatric:  The pt has Normal affect.   Non-Invasive Vascular Imaging:    FINDINGS: Left lower Extremity  Inflow: Abnormal monophasic arterial waveforms. Large bulky calcified plaque present in the common femoral artery. The arterial waveform is visualized only in the distal segment and is abnormal with delayed systolic upstroke and blunting of the peak systolic velocity. Findings suggest a postobstructive appearance.  Outflow: Abnormal monophasic arterial waveforms.  Runoff: Abnormal monophasic arterial waveforms.  IMPRESSION: 1. 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 2. Abnormal monophasic arterial waveforms throughout the remaining left lower extremity arterial tree consistent with a postobstructive pattern.   ABI Findings:  +---------+------------------+-----+---------+--------+  Right   Rt Pressure (mmHg)IndexWaveform Comment   +---------+------------------+-----+---------+--------+  Brachial 152                                       +---------+------------------+-----+---------+--------+  PTA     162               1.07 triphasic          +---------+------------------+-----+---------+--------+  DP      166               1.09 triphasic          +---------+------------------+-----+---------+--------+  Great Toe106               0.70                    +---------+------------------+-----+---------+--------+   +---------+------------------+-----+----------+-------+  Left    Lt Pressure (mmHg)IndexWaveform  Comment  +---------+------------------+-----+----------+-------+  Brachial 145                                       +---------+------------------+-----+----------+-------+  PTA     85                0.56 monophasic         +---------+------------------+-----+----------+-------+  DP      130  0.86 monophasic         +---------+------------------+-----+----------+-------+  Great Toe41                0.27                    +---------+------------------+-----+----------+-------+   ASSESSMENT/PLAN: Henritta Mutz is a 58 y.o. female presenting with left lower extremity lifestyle-limiting claudication.  Fortunately, symptoms have improved with a walking regimen and Pletal.  I had a long conversation with Bonita Quin regarding her most recent CT scan.  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  While this is fixable with iliofemoral endarterectomy, retrograde stenting, with her malignancy, I am very  concerned that this would thrombose.  Being that her symptoms are tolerable, we both agreed to continue conservative management.  I am happy she continues to do well on Keytruda.  Literature demonstrates a 10% 5-year survival with her current malignancy.  She is aware that surgery for her is reserved for rest pain and tissue loss only, especially being that she has had issues with healing and infection while on Keytruda I asked her to continue her current medication regimen. We discussed the importance of smoking cessation My plan is to see her in 12 months time for repeat ABI.  She was asked to call my office should any questions or concerns arise.    Victorino Sparrow, MD [facility] [phone] Total time of patient care including pre-visit research, consultation, and documentation greater than 20 minutes

## 2023-02-22 ENCOUNTER — Encounter: Payer: Self-pay | Admitting: Vascular Surgery

## 2023-02-22 ENCOUNTER — Ambulatory Visit: Payer: Federal, State, Local not specified - PPO | Admitting: Vascular Surgery

## 2023-02-22 VITALS — BP 120/73 | HR 75 | Temp 97.7°F | Resp 20 | Ht 65.0 in | Wt 106.0 lb

## 2023-02-22 DIAGNOSIS — I739 Peripheral vascular disease, unspecified: Secondary | ICD-10-CM | POA: Diagnosis not present

## 2023-02-27 DIAGNOSIS — Z792 Long term (current) use of antibiotics: Secondary | ICD-10-CM | POA: Diagnosis not present

## 2023-02-27 DIAGNOSIS — Z79899 Other long term (current) drug therapy: Secondary | ICD-10-CM | POA: Diagnosis not present

## 2023-02-27 DIAGNOSIS — Z8701 Personal history of pneumonia (recurrent): Secondary | ICD-10-CM | POA: Diagnosis not present

## 2023-02-27 DIAGNOSIS — I10 Essential (primary) hypertension: Secondary | ICD-10-CM | POA: Diagnosis not present

## 2023-02-27 DIAGNOSIS — R0603 Acute respiratory distress: Secondary | ICD-10-CM | POA: Diagnosis not present

## 2023-02-27 DIAGNOSIS — Z87891 Personal history of nicotine dependence: Secondary | ICD-10-CM | POA: Diagnosis not present

## 2023-02-27 DIAGNOSIS — Z681 Body mass index (BMI) 19 or less, adult: Secondary | ICD-10-CM | POA: Diagnosis not present

## 2023-02-27 DIAGNOSIS — I517 Cardiomegaly: Secondary | ICD-10-CM | POA: Diagnosis not present

## 2023-02-27 DIAGNOSIS — E440 Moderate protein-calorie malnutrition: Secondary | ICD-10-CM | POA: Diagnosis not present

## 2023-02-27 DIAGNOSIS — R079 Chest pain, unspecified: Secondary | ICD-10-CM | POA: Diagnosis not present

## 2023-02-27 DIAGNOSIS — A419 Sepsis, unspecified organism: Secondary | ICD-10-CM | POA: Diagnosis not present

## 2023-02-27 DIAGNOSIS — J45909 Unspecified asthma, uncomplicated: Secondary | ICD-10-CM | POA: Diagnosis not present

## 2023-02-27 DIAGNOSIS — C3490 Malignant neoplasm of unspecified part of unspecified bronchus or lung: Secondary | ICD-10-CM | POA: Diagnosis not present

## 2023-02-27 DIAGNOSIS — E7800 Pure hypercholesterolemia, unspecified: Secondary | ICD-10-CM | POA: Diagnosis not present

## 2023-02-27 DIAGNOSIS — J9601 Acute respiratory failure with hypoxia: Secondary | ICD-10-CM | POA: Diagnosis not present

## 2023-02-27 DIAGNOSIS — E785 Hyperlipidemia, unspecified: Secondary | ICD-10-CM | POA: Diagnosis not present

## 2023-02-27 DIAGNOSIS — R0602 Shortness of breath: Secondary | ICD-10-CM | POA: Diagnosis not present

## 2023-02-27 DIAGNOSIS — Z1152 Encounter for screening for COVID-19: Secondary | ICD-10-CM | POA: Diagnosis not present

## 2023-02-27 DIAGNOSIS — Z7952 Long term (current) use of systemic steroids: Secondary | ICD-10-CM | POA: Diagnosis not present

## 2023-02-27 DIAGNOSIS — J441 Chronic obstructive pulmonary disease with (acute) exacerbation: Secondary | ICD-10-CM | POA: Diagnosis not present

## 2023-03-07 DIAGNOSIS — J441 Chronic obstructive pulmonary disease with (acute) exacerbation: Secondary | ICD-10-CM | POA: Diagnosis not present

## 2023-03-07 DIAGNOSIS — Z681 Body mass index (BMI) 19 or less, adult: Secondary | ICD-10-CM | POA: Diagnosis not present

## 2023-03-07 DIAGNOSIS — C3490 Malignant neoplasm of unspecified part of unspecified bronchus or lung: Secondary | ICD-10-CM | POA: Diagnosis not present

## 2023-03-08 ENCOUNTER — Other Ambulatory Visit: Payer: Self-pay

## 2023-03-08 DIAGNOSIS — I739 Peripheral vascular disease, unspecified: Secondary | ICD-10-CM

## 2023-03-12 ENCOUNTER — Other Ambulatory Visit: Payer: Self-pay

## 2023-03-12 DIAGNOSIS — C3481 Malignant neoplasm of overlapping sites of right bronchus and lung: Secondary | ICD-10-CM

## 2023-03-12 MED ORDER — PROCHLORPERAZINE MALEATE 10 MG PO TABS: 10.0000 mg | ORAL_TABLET | Freq: Four times a day (QID) | ORAL | 3 refills | Status: AC | PRN

## 2023-03-18 NOTE — Progress Notes (Unsigned)
 [facility]  [address] [place],  [place]  [address] [phone]  Clinic Day:  03/19/2023  Referring physician: Eunice Blase, PA-C   HISTORY OF PRESENT ILLNESS:  The patient is a 58 y.o. female with stage IVA lung adenocarcinoma.  This staging is based upon initial scans showing bilateral lung involvement with her cancer.  She comes in today to be evaluated before heading into her 38th cycle of maintenance pembrolizumab immunotherapy.  The patient claims to have tolerated her last cycle of treatment fairly well.  However, she went to the emergency room recently for shortness of breath.  A CT angiogram was done at that time for which neither a pulmonary embolus, air-space disease, or worsening cancer was appreciated.  Although she is doing better, mild shortness of breath persists.     PHYSICAL EXAM:  Blood pressure (!) 145/91, pulse 88, temperature 98.2 F (36.8 C), temperature source Oral, resp. rate 14, height 5\' 5"  (1.651 m), weight 102 lb 1.6 oz (46.3 kg), SpO2 95%.  O2 sat never fell below  Wt Readings from Last 3 Encounters:  03/19/23 102 lb 1.6 oz (46.3 kg)  02/22/23 106 lb (48.1 kg)  02/05/23 109 lb (49.4 kg)   Body mass index is 16.99 kg/m. Performance status (ECOG): 1 - Symptomatic but completely ambulatory Physical Exam Constitutional:      Appearance: Normal appearance. She is not ill-appearing.  HENT:     Mouth/Throat:     Mouth: Mucous membranes are moist.     Pharynx: Oropharynx is clear. No oropharyngeal exudate or posterior oropharyngeal erythema.  Cardiovascular:     Rate and Rhythm: Normal rate and regular rhythm.     Heart sounds: No murmur heard.    No friction rub. No gallop.  Pulmonary:     Effort: Pulmonary effort is normal. No respiratory distress.     Breath sounds: Decreased breath sounds present. No wheezing, rhonchi or rales.  Abdominal:     General: Bowel sounds are normal. There is no distension.      Palpations: Abdomen is soft. There is no mass.     Tenderness: There is no abdominal tenderness.  Musculoskeletal:        General: No swelling.     Right lower leg: No edema.     Left lower leg: No edema.  Lymphadenopathy:     Cervical: No cervical adenopathy.     Upper Body:     Right upper body: No supraclavicular or axillary adenopathy.     Left upper body: No supraclavicular or axillary adenopathy.     Lower Body: No right inguinal adenopathy. No left inguinal adenopathy.  Skin:    General: Skin is warm.     Coloration: Skin is not jaundiced.     Findings: No lesion or rash.  Neurological:     General: No focal deficit present.     Mental Status: She is alert and oriented to person, place, and time. Mental status is at baseline.  Psychiatric:        Mood and Affect: Mood normal.        Behavior: Behavior normal.        Thought Content: Thought content normal.   LABS:      Latest Ref Rng & Units 03/19/2023    9:25 AM 02/05/2023    9:20 AM 12/20/2022   12:00 AM  CBC  WBC 4.0 - 10.5 K/uL 7.6  7.6  4.7      Hemoglobin  12.0 - 15.0 g/dL 16.1  09.6  04.5      Hematocrit 36.0 - 46.0 % 37.1  39.8  40      Platelets 150 - 400 K/uL 255  301  288         This result is from an external source.      Latest Ref Rng & Units 03/19/2023    9:25 AM 02/05/2023    9:20 AM 12/20/2022   12:00 AM  CMP  Glucose 70 - 99 mg/dL 72  409    BUN 6 - 20 mg/dL 14  11  9       Creatinine 0.44 - 1.00 mg/dL 8.11  9.14  0.6      Sodium 135 - 145 mmol/L 137  134  131      Potassium 3.5 - 5.1 mmol/L 4.4  3.9  4.0      Chloride 98 - 111 mmol/L 101  99  101      CO2 22 - 32 mmol/L 27  24  23       Calcium 8.9 - 10.3 mg/dL 9.1  9.1  8.9      Total Protein 6.5 - 8.1 g/dL 6.5  6.3    Total Bilirubin 0.0 - 1.2 mg/dL 0.3  0.3    Alkaline Phos 38 - 126 U/L 59  55  46      AST 15 - 41 U/L 18  20  31       ALT 0 - 44 U/L 19  17  23          This result is from an external source.    Latest Reference Range &  Units 03/19/23 09:25  Iron 28 - 170 ug/dL 64  UIBC ug/dL 782  TIBC 956 - 213 ug/dL 086  Saturation Ratios 10.4 - 31.8 % 22  Ferritin 11 - 307 ng/mL 145  Folate >5.9 ng/mL 16.8  Vitamin B12 180 - 914 pg/mL 395    ASSESSMENT & PLAN:  Assessment/Plan:  A 58 y.o. female with stage IVA lung adenocarcinoma.  She will proceed with her 38th cycle of pembrolizumab today.  Clinically, the patient appears to be doing fairly well.   With respect to her shortness of breath, there does not appear to be a respiratory issue present.  Her hemoglobin, although normal, is lower than what it has been in the past.  However, her iron, B12, and folate levels are all normal.  As there appears to be no obvious etiology behind her shortness of breath, this will be followed conservatively for now.  I will see her back in 6 weeks before she heads into her 39th cycle of pembrolizumab immunotherapy.  The patient understands all the plans discussed today and is in agreement with them.   Tonique Mendonca Kirby Funk, MD

## 2023-03-19 ENCOUNTER — Inpatient Hospital Stay: Payer: Federal, State, Local not specified - PPO | Attending: Hematology and Oncology

## 2023-03-19 ENCOUNTER — Other Ambulatory Visit: Payer: Self-pay

## 2023-03-19 ENCOUNTER — Inpatient Hospital Stay: Payer: Federal, State, Local not specified - PPO | Attending: Hematology and Oncology | Admitting: Oncology

## 2023-03-19 ENCOUNTER — Inpatient Hospital Stay: Payer: Federal, State, Local not specified - PPO

## 2023-03-19 ENCOUNTER — Other Ambulatory Visit: Payer: Self-pay | Admitting: Oncology

## 2023-03-19 VITALS — BP 145/91 | HR 88 | Temp 98.2°F | Resp 14 | Ht 65.0 in | Wt 102.1 lb

## 2023-03-19 DIAGNOSIS — Z5112 Encounter for antineoplastic immunotherapy: Secondary | ICD-10-CM | POA: Diagnosis not present

## 2023-03-19 DIAGNOSIS — C3481 Malignant neoplasm of overlapping sites of right bronchus and lung: Secondary | ICD-10-CM

## 2023-03-19 LAB — CBC WITH DIFFERENTIAL (CANCER CENTER ONLY)
Abs Immature Granulocytes: 0.05 10*3/uL (ref 0.00–0.07)
Basophils Absolute: 0.1 10*3/uL (ref 0.0–0.1)
Basophils Relative: 1 %
Eosinophils Absolute: 0.2 10*3/uL (ref 0.0–0.5)
Eosinophils Relative: 2 %
HCT: 37.1 % (ref 36.0–46.0)
Hemoglobin: 12.5 g/dL (ref 12.0–15.0)
Immature Granulocytes: 1 %
Lymphocytes Relative: 13 %
Lymphs Abs: 1 10*3/uL (ref 0.7–4.0)
MCH: 31.4 pg (ref 26.0–34.0)
MCHC: 33.7 g/dL (ref 30.0–36.0)
MCV: 93.2 fL (ref 80.0–100.0)
Monocytes Absolute: 0.7 10*3/uL (ref 0.1–1.0)
Monocytes Relative: 9 %
Neutro Abs: 5.7 10*3/uL (ref 1.7–7.7)
Neutrophils Relative %: 74 %
Platelet Count: 255 10*3/uL (ref 150–400)
RBC: 3.98 MIL/uL (ref 3.87–5.11)
RDW: 12.8 % (ref 11.5–15.5)
WBC Count: 7.6 10*3/uL (ref 4.0–10.5)
nRBC: 0 % (ref 0.0–0.2)
nRBC: 0 /100{WBCs}

## 2023-03-19 LAB — CMP (CANCER CENTER ONLY)
ALT: 19 U/L (ref 0–44)
AST: 18 U/L (ref 15–41)
Albumin: 3.8 g/dL (ref 3.5–5.0)
Alkaline Phosphatase: 59 U/L (ref 38–126)
Anion gap: 9 (ref 5–15)
BUN: 14 mg/dL (ref 6–20)
CO2: 27 mmol/L (ref 22–32)
Calcium: 9.1 mg/dL (ref 8.9–10.3)
Chloride: 101 mmol/L (ref 98–111)
Creatinine: 0.55 mg/dL (ref 0.44–1.00)
GFR, Estimated: >60 mL/min (ref >60)
Glucose, Bld: 72 mg/dL (ref 70–99)
Potassium: 4.4 mmol/L (ref 3.5–5.1)
Sodium: 137 mmol/L (ref 135–145)
Total Bilirubin: 0.3 mg/dL (ref 0.0–1.2)
Total Protein: 6.5 g/dL (ref 6.5–8.1)

## 2023-03-19 LAB — VITAMIN B12: Vitamin B-12: 395 pg/mL (ref 180–914)

## 2023-03-19 LAB — IRON AND TIBC
Iron: 64 ug/dL (ref 28–170)
Saturation Ratios: 22 % (ref 10.4–31.8)
TIBC: 286 ug/dL (ref 250–450)
UIBC: 222 ug/dL

## 2023-03-19 LAB — FOLATE: Folate: 16.8 ng/mL (ref >5.9)

## 2023-03-19 LAB — FERRITIN: Ferritin: 145 ng/mL (ref 11–307)

## 2023-03-19 MED ORDER — SODIUM CHLORIDE 0.9 % IV SOLN
200.0000 mg | Freq: Once | INTRAVENOUS | Status: AC
  Administered 2023-03-19: 200 mg via INTRAVENOUS
  Filled 2023-03-19: qty 8

## 2023-03-19 MED ORDER — HEPARIN SOD (PORK) LOCK FLUSH 100 UNIT/ML IV SOLN
500.0000 [IU] | Freq: Once | INTRAVENOUS | Status: AC | PRN
Start: 2023-03-19 — End: 2023-03-19
  Administered 2023-03-19: 500 [IU]

## 2023-03-19 MED ORDER — SODIUM CHLORIDE 0.9% FLUSH
10.0000 mL | INTRAVENOUS | Status: DC | PRN
Start: 2023-03-19 — End: 2023-03-19
  Administered 2023-03-19: 10 mL

## 2023-03-19 MED ORDER — SODIUM CHLORIDE 0.9 % IV SOLN: Freq: Once | INTRAVENOUS | Status: AC

## 2023-03-19 NOTE — Patient Instructions (Signed)

## 2023-04-27 ENCOUNTER — Encounter: Payer: Self-pay | Admitting: Oncology

## 2023-04-29 NOTE — Progress Notes (Unsigned)
 [facility]  [address] [place],  [place]  [address] [phone]  Clinic Day:  04/30/2023  Referring physician: Weston Settle, MD   HISTORY OF PRESENT ILLNESS:  The patient is a 58 y.o. female with stage IVA lung adenocarcinoma.  This staging is based upon initial scans showing bilateral lung involvement with her cancer.  She comes in today to be evaluated before heading into her 39th cycle of maintenance pembrolizumab immunotherapy.  The patient claims to have tolerated her last cycle of treatment very well.  She denies having any recent shortness of breath, chest pain, or other respiratory symptoms which concern her for disease recurrence.      PHYSICAL EXAM:  Blood pressure 137/70, pulse 71, temperature 97.9 F (36.6 C), temperature source Oral, resp. rate 16, height 5\' 5"  (1.651 m), weight 111 lb 9.6 oz (50.6 kg), SpO2 96%.   Wt Readings from Last 3 Encounters:  04/30/23 111 lb 9.6 oz (50.6 kg)  03/19/23 102 lb 1.6 oz (46.3 kg)  02/22/23 106 lb (48.1 kg)   Body mass index is 18.57 kg/m. Performance status (ECOG): 1 - Symptomatic but completely ambulatory Physical Exam Constitutional:      Appearance: Normal appearance. She is not ill-appearing.  HENT:     Mouth/Throat:     Mouth: Mucous membranes are moist.     Pharynx: Oropharynx is clear. No oropharyngeal exudate or posterior oropharyngeal erythema.  Cardiovascular:     Rate and Rhythm: Normal rate and regular rhythm.     Heart sounds: No murmur heard.    No friction rub. No gallop.  Pulmonary:     Effort: Pulmonary effort is normal. No respiratory distress.     Breath sounds: Decreased breath sounds present. No wheezing, rhonchi or rales.  Abdominal:     General: Bowel sounds are normal. There is no distension.     Palpations: Abdomen is soft. There is no mass.     Tenderness: There is no abdominal tenderness.  Musculoskeletal:        General: No swelling.     Right lower  leg: No edema.     Left lower leg: No edema.  Lymphadenopathy:     Cervical: No cervical adenopathy.     Upper Body:     Right upper body: No supraclavicular or axillary adenopathy.     Left upper body: No supraclavicular or axillary adenopathy.     Lower Body: No right inguinal adenopathy. No left inguinal adenopathy.  Skin:    General: Skin is warm.     Coloration: Skin is not jaundiced.     Findings: No lesion or rash.  Neurological:     General: No focal deficit present.     Mental Status: She is alert and oriented to person, place, and time. Mental status is at baseline.  Psychiatric:        Mood and Affect: Mood normal.        Behavior: Behavior normal.        Thought Content: Thought content normal.    LABS:      Latest Ref Rng & Units 04/30/2023    9:25 AM 03/19/2023    9:25 AM 02/05/2023    9:20 AM  CBC  WBC 4.0 - 10.5 K/uL 6.0  7.6  7.6   Hemoglobin 12.0 - 15.0 g/dL 44.0  34.7  42.5   Hematocrit 36.0 - 46.0 % 36.1  37.1  39.8   Platelets 150 - 400 K/uL  303  255  301       Latest Ref Rng & Units 03/19/2023    9:25 AM 02/05/2023    9:20 AM 12/20/2022   12:00 AM  CMP  Glucose 70 - 99 mg/dL 72  161    BUN 6 - 20 mg/dL 14  11  9       Creatinine 0.44 - 1.00 mg/dL 0.96  0.45  0.6      Sodium 135 - 145 mmol/L 137  134  131      Potassium 3.5 - 5.1 mmol/L 4.4  3.9  4.0      Chloride 98 - 111 mmol/L 101  99  101      CO2 22 - 32 mmol/L 27  24  23       Calcium 8.9 - 10.3 mg/dL 9.1  9.1  8.9      Total Protein 6.5 - 8.1 g/dL 6.5  6.3    Total Bilirubin 0.0 - 1.2 mg/dL 0.3  0.3    Alkaline Phos 38 - 126 U/L 59  55  46      AST 15 - 41 U/L 18  20  31       ALT 0 - 44 U/L 19  17  23          This result is from an external source.    ASSESSMENT & PLAN:  Assessment/Plan:  A 58 y.o. female with stage IVA lung adenocarcinoma.  She will proceed with her 39th cycle of pembrolizumab today.  The patient claims to have no further problems with the shortness of breath she had in  prior months.  Clinically, the patient appears to be doing well.  I will see her back in 6 weeks before she heads into her 40th cycle of pembrolizumab immunotherapy.  The patient understands all the plans discussed today and is in agreement with them.   Darvin Dials Kirby Funk, MD

## 2023-04-30 ENCOUNTER — Inpatient Hospital Stay: Payer: Federal, State, Local not specified - PPO | Attending: Hematology and Oncology

## 2023-04-30 ENCOUNTER — Inpatient Hospital Stay: Payer: Federal, State, Local not specified - PPO

## 2023-04-30 ENCOUNTER — Telehealth: Payer: Self-pay | Admitting: Oncology

## 2023-04-30 ENCOUNTER — Inpatient Hospital Stay (HOSPITAL_BASED_OUTPATIENT_CLINIC_OR_DEPARTMENT_OTHER): Payer: Federal, State, Local not specified - PPO | Admitting: Oncology

## 2023-04-30 VITALS — BP 137/70 | HR 71 | Temp 97.9°F | Resp 16 | Ht 65.0 in | Wt 111.6 lb

## 2023-04-30 DIAGNOSIS — C3481 Malignant neoplasm of overlapping sites of right bronchus and lung: Secondary | ICD-10-CM | POA: Diagnosis not present

## 2023-04-30 DIAGNOSIS — Z5112 Encounter for antineoplastic immunotherapy: Secondary | ICD-10-CM | POA: Diagnosis not present

## 2023-04-30 DIAGNOSIS — Z79620 Long term (current) use of immunosuppressive biologic: Secondary | ICD-10-CM | POA: Insufficient documentation

## 2023-04-30 LAB — CBC WITH DIFFERENTIAL (CANCER CENTER ONLY)
Abs Immature Granulocytes: 0.03 10*3/uL (ref 0.00–0.07)
Basophils Absolute: 0.1 10*3/uL (ref 0.0–0.1)
Basophils Relative: 2 %
Eosinophils Absolute: 0.2 10*3/uL (ref 0.0–0.5)
Eosinophils Relative: 4 %
HCT: 36.1 % (ref 36.0–46.0)
Hemoglobin: 12.2 g/dL (ref 12.0–15.0)
Immature Granulocytes: 1 %
Immature Platelet Fraction: 1 % — ABNORMAL LOW (ref 1.2–8.6)
Lymphocytes Relative: 20 %
Lymphs Abs: 1.2 10*3/uL (ref 0.7–4.0)
MCH: 31.6 pg (ref 26.0–34.0)
MCHC: 33.8 g/dL (ref 30.0–36.0)
MCV: 93.5 fL (ref 80.0–100.0)
Monocytes Absolute: 0.6 10*3/uL (ref 0.1–1.0)
Monocytes Relative: 10 %
Neutro Abs: 3.8 10*3/uL (ref 1.7–7.7)
Neutrophils Relative %: 63 %
Platelet Count: 303 10*3/uL (ref 150–400)
RBC: 3.86 MIL/uL — ABNORMAL LOW (ref 3.87–5.11)
RDW: 13.3 % (ref 11.5–15.5)
WBC Count: 6 10*3/uL (ref 4.0–10.5)
nRBC: 0 % (ref 0.0–0.2)
nRBC: 0 /100{WBCs}

## 2023-04-30 LAB — CMP (CANCER CENTER ONLY)
ALT: 15 U/L (ref 0–44)
AST: 21 U/L (ref 15–41)
Albumin: 4.3 g/dL (ref 3.5–5.0)
Alkaline Phosphatase: 50 U/L (ref 38–126)
Anion gap: 11 (ref 5–15)
BUN: 8 mg/dL (ref 6–20)
CO2: 24 mmol/L (ref 22–32)
Calcium: 9 mg/dL (ref 8.9–10.3)
Chloride: 102 mmol/L (ref 98–111)
Creatinine: 0.62 mg/dL (ref 0.44–1.00)
GFR, Estimated: >60 mL/min (ref >60)
Glucose, Bld: 82 mg/dL (ref 70–99)
Potassium: 4 mmol/L (ref 3.5–5.1)
Sodium: 138 mmol/L (ref 135–145)
Total Bilirubin: 0.4 mg/dL (ref 0.0–1.2)
Total Protein: 6.1 g/dL — ABNORMAL LOW (ref 6.5–8.1)

## 2023-04-30 LAB — TSH: TSH: 1.606 u[IU]/mL (ref 0.350–4.500)

## 2023-04-30 MED ORDER — SODIUM CHLORIDE 0.9 % IV SOLN
200.0000 mg | Freq: Once | INTRAVENOUS | Status: AC
Start: 2023-04-30 — End: 2023-04-30
  Administered 2023-04-30: 200 mg via INTRAVENOUS
  Filled 2023-04-30: qty 8

## 2023-04-30 MED ORDER — SODIUM CHLORIDE 0.9 % IV SOLN: Freq: Once | INTRAVENOUS | Status: AC

## 2023-04-30 MED ORDER — HEPARIN SOD (PORK) LOCK FLUSH 100 UNIT/ML IV SOLN
500.0000 [IU] | Freq: Once | INTRAVENOUS | Status: AC | PRN
  Administered 2023-04-30: 500 [IU]

## 2023-04-30 MED ORDER — SODIUM CHLORIDE 0.9% FLUSH
10.0000 mL | INTRAVENOUS | Status: DC | PRN
  Administered 2023-04-30: 10 mL

## 2023-04-30 NOTE — Telephone Encounter (Signed)
 04/30/23 Spoke with patient and confirmed next appts.

## 2023-04-30 NOTE — Patient Instructions (Signed)
 [facility] - A DEPT OF [facility]  Discharge Instructions: Thank you for choosing [facility] to provide your oncology and hematology care.  If you have a lab appointment with the Cancer Center, please go directly to the Cancer Center and check in at the registration area.   Wear comfortable clothing and clothing appropriate for easy access to any Portacath or PICC line.   We strive to give you quality time with your provider. You may need to reschedule your appointment if you arrive late (15 or more minutes).  Arriving late affects you and other patients whose appointments are after yours.  Also, if you miss three or more appointments without notifying the office, you may be dismissed from the clinic at the provider's discretion.      For prescription refill requests, have your pharmacy contact our office and allow 72 hours for refills to be completed.    Today you received the following chemotherapy and/or immunotherapy agents Keytruda      To help prevent nausea and vomiting after your treatment, we encourage you to take your nausea medication as directed.  BELOW ARE SYMPTOMS THAT SHOULD BE REPORTED IMMEDIATELY: *FEVER GREATER THAN 100.4 F (38 C) OR HIGHER *CHILLS OR SWEATING *NAUSEA AND VOMITING THAT IS NOT CONTROLLED WITH YOUR NAUSEA MEDICATION *UNUSUAL SHORTNESS OF BREATH *UNUSUAL BRUISING OR BLEEDING *URINARY PROBLEMS (pain or burning when urinating, or frequent urination) *BOWEL PROBLEMS (unusual diarrhea, constipation, pain near the anus) TENDERNESS IN MOUTH AND THROAT WITH OR WITHOUT PRESENCE OF ULCERS (sore throat, sores in mouth, or a toothache) UNUSUAL RASH, SWELLING OR PAIN  UNUSUAL VAGINAL DISCHARGE OR ITCHING   Items with * indicate a potential emergency and should be followed up as soon as possible or go to the Emergency Department if any problems should occur.  Please show the CHEMOTHERAPY ALERT CARD or IMMUNOTHERAPY ALERT  CARD at check-in to the Emergency Department and triage nurse.  Should you have questions after your visit or need to cancel or reschedule your appointment, please contact [facility] [place] - [facility]  Dept: [phone]  and follow the prompts.  Office hours are 8:00 a.m. to 4:30 p.m. Monday - Friday. Please note that voicemails left after 4:00 p.m. may not be returned until the following business day.  We are closed weekends and major holidays. You have access to a nurse at all times for urgent questions. Please call the main number to the clinic Dept: [phone] and follow the prompts.  For any non-urgent questions, you may also contact your provider using MyChart. We now offer e-Visits for anyone 72 and older to request care online for non-urgent symptoms. For details visit [email or web address].   Also download the MyChart app! Go to the app store, search "MyChart", open the app, select [place], and log in with your MyChart username and password.

## 2023-05-01 LAB — T4: T4, Total: 7.1 ug/dL (ref 4.5–12.0)

## 2023-06-10 NOTE — Progress Notes (Signed)
 [facility]  [address] [place],  [place]  [address] [phone]  Clinic Day:  06/11/2023  Referring physician: Clydene Darner, PA-C   HISTORY OF PRESENT ILLNESS:  The patient is a 58 y.o. female with stage IVA lung adenocarcinoma.  This staging is based upon initial scans showing bilateral lung involvement with her cancer.  She comes in today to be evaluated before heading into her 40th cycle of maintenance pembrolizumab  immunotherapy.  The patient claims to have tolerated her last cycle of treatment very well.  She denies having any recent shortness of breath, chest pain, or other respiratory symptoms which concern her for disease recurrence.      PHYSICAL EXAM:  Blood pressure 133/76, pulse 77, temperature 97.8 F (36.6 C), temperature source Oral, resp. rate 16, height 5\' 5"  (1.651 m), weight 108 lb 8 oz (49.2 kg), SpO2 98%.   Wt Readings from Last 3 Encounters:  06/11/23 111 lb (50.3 kg)  06/11/23 108 lb 8 oz (49.2 kg)  04/30/23 111 lb 9.6 oz (50.6 kg)   Body mass index is 18.06 kg/m. Performance status (ECOG): 1 - Symptomatic but completely ambulatory Physical Exam Constitutional:      Appearance: Normal appearance. She is not ill-appearing.  HENT:     Mouth/Throat:     Mouth: Mucous membranes are moist.     Pharynx: Oropharynx is clear. No oropharyngeal exudate or posterior oropharyngeal erythema.  Cardiovascular:     Rate and Rhythm: Normal rate and regular rhythm.     Heart sounds: No murmur heard.    No friction rub. No gallop.  Pulmonary:     Effort: Pulmonary effort is normal. No respiratory distress.     Breath sounds: Decreased breath sounds present. No wheezing, rhonchi or rales.  Abdominal:     General: Bowel sounds are normal. There is no distension.     Palpations: Abdomen is soft. There is no mass.     Tenderness: There is no abdominal tenderness.  Musculoskeletal:        General: No swelling.     Right lower leg: No  edema.     Left lower leg: No edema.  Lymphadenopathy:     Cervical: No cervical adenopathy.     Upper Body:     Right upper body: No supraclavicular or axillary adenopathy.     Left upper body: No supraclavicular or axillary adenopathy.     Lower Body: No right inguinal adenopathy. No left inguinal adenopathy.  Skin:    General: Skin is warm.     Coloration: Skin is not jaundiced.     Findings: No lesion or rash.  Neurological:     General: No focal deficit present.     Mental Status: She is alert and oriented to person, place, and time. Mental status is at baseline.  Psychiatric:        Mood and Affect: Mood normal.        Behavior: Behavior normal.        Thought Content: Thought content normal.    LABS:      Latest Ref Rng & Units 06/11/2023    1:33 PM 04/30/2023    9:25 AM 03/19/2023    9:25 AM  CBC  WBC 4.0 - 10.5 K/uL 6.9  6.0  7.6   Hemoglobin 12.0 - 15.0 g/dL 64.3  32.9  51.8   Hematocrit 36.0 - 46.0 % 41.1  36.1  37.1   Platelets 150 - 400 K/uL 259  303  255       Latest Ref Rng & Units 06/11/2023    1:33 PM 04/30/2023    9:25 AM 03/19/2023    9:25 AM  CMP  Glucose 70 - 99 mg/dL 161  82  72   BUN 6 - 20 mg/dL 11  8  14    Creatinine 0.44 - 1.00 mg/dL 0.96  0.45  4.09   Sodium 135 - 145 mmol/L 133  138  137   Potassium 3.5 - 5.1 mmol/L 3.9  4.0  4.4   Chloride 98 - 111 mmol/L 101  102  101   CO2 22 - 32 mmol/L 21  24  27    Calcium 8.9 - 10.3 mg/dL 9.1  9.0  9.1   Total Protein 6.5 - 8.1 g/dL 6.3  6.1  6.5   Total Bilirubin 0.0 - 1.2 mg/dL 0.3  0.4  0.3   Alkaline Phos 38 - 126 U/L 52  50  59   AST 15 - 41 U/L 21  21  18    ALT 0 - 44 U/L 19  15  19      ASSESSMENT & PLAN:  Assessment/Plan:  A 57 y.o. female with stage IVA lung adenocarcinoma.  She will proceed with her 40th cycle of pembrolizumab  today.  Clinically, the patient appears to be doing well.  I will see her back in 6 weeks before she heads into her 41st cycle of pembrolizumab  immunotherapy.  A chest  CT will be done before her next visit to ascertain her new disease baseline after 40 cycles of immunotherapy.  The patient understands all the plans discussed today and is in agreement with them.   Vylet Maffia Felicia Horde, MD

## 2023-06-11 ENCOUNTER — Inpatient Hospital Stay

## 2023-06-11 ENCOUNTER — Inpatient Hospital Stay: Attending: Hematology and Oncology | Admitting: Oncology

## 2023-06-11 VITALS — BP 133/76 | HR 77 | Temp 97.8°F | Resp 16 | Wt 111.0 lb

## 2023-06-11 VITALS — BP 133/76 | HR 77 | Temp 97.8°F | Resp 16 | Ht 65.0 in | Wt 108.5 lb

## 2023-06-11 DIAGNOSIS — Z79620 Long term (current) use of immunosuppressive biologic: Secondary | ICD-10-CM | POA: Insufficient documentation

## 2023-06-11 DIAGNOSIS — C3481 Malignant neoplasm of overlapping sites of right bronchus and lung: Secondary | ICD-10-CM | POA: Diagnosis not present

## 2023-06-11 DIAGNOSIS — C3490 Malignant neoplasm of unspecified part of unspecified bronchus or lung: Secondary | ICD-10-CM | POA: Insufficient documentation

## 2023-06-11 DIAGNOSIS — Z5112 Encounter for antineoplastic immunotherapy: Secondary | ICD-10-CM | POA: Insufficient documentation

## 2023-06-11 LAB — CMP (CANCER CENTER ONLY)
ALT: 19 U/L (ref 0–44)
AST: 21 U/L (ref 15–41)
Albumin: 4.1 g/dL (ref 3.5–5.0)
Alkaline Phosphatase: 52 U/L (ref 38–126)
Anion gap: 10 (ref 5–15)
BUN: 11 mg/dL (ref 6–20)
CO2: 21 mmol/L — ABNORMAL LOW (ref 22–32)
Calcium: 9.1 mg/dL (ref 8.9–10.3)
Chloride: 101 mmol/L (ref 98–111)
Creatinine: 0.63 mg/dL (ref 0.44–1.00)
GFR, Estimated: >60 mL/min (ref >60)
Glucose, Bld: 101 mg/dL — ABNORMAL HIGH (ref 70–99)
Potassium: 3.9 mmol/L (ref 3.5–5.1)
Sodium: 133 mmol/L — ABNORMAL LOW (ref 135–145)
Total Bilirubin: 0.3 mg/dL (ref 0.0–1.2)
Total Protein: 6.3 g/dL — ABNORMAL LOW (ref 6.5–8.1)

## 2023-06-11 LAB — TSH: TSH: 1.119 u[IU]/mL (ref 0.350–4.500)

## 2023-06-11 LAB — CBC WITH DIFFERENTIAL (CANCER CENTER ONLY)
Abs Immature Granulocytes: 0.02 10*3/uL (ref 0.00–0.07)
Basophils Absolute: 0.1 10*3/uL (ref 0.0–0.1)
Basophils Relative: 1 %
Eosinophils Absolute: 0.1 10*3/uL (ref 0.0–0.5)
Eosinophils Relative: 1 %
HCT: 41.1 % (ref 36.0–46.0)
Hemoglobin: 13.4 g/dL (ref 12.0–15.0)
Immature Granulocytes: 0 %
Lymphocytes Relative: 16 %
Lymphs Abs: 1.1 10*3/uL (ref 0.7–4.0)
MCH: 30.3 pg (ref 26.0–34.0)
MCHC: 32.6 g/dL (ref 30.0–36.0)
MCV: 93 fL (ref 80.0–100.0)
Monocytes Absolute: 0.5 10*3/uL (ref 0.1–1.0)
Monocytes Relative: 7 %
Neutro Abs: 5.1 10*3/uL (ref 1.7–7.7)
Neutrophils Relative %: 75 %
Platelet Count: 259 10*3/uL (ref 150–400)
RBC: 4.42 MIL/uL (ref 3.87–5.11)
RDW: 12.8 % (ref 11.5–15.5)
WBC Count: 6.9 10*3/uL (ref 4.0–10.5)
nRBC: 0 % (ref 0.0–0.2)

## 2023-06-11 MED ORDER — SODIUM CHLORIDE 0.9 % IV SOLN
200.0000 mg | Freq: Once | INTRAVENOUS | Status: AC
  Administered 2023-06-11: 200 mg via INTRAVENOUS
  Filled 2023-06-11: qty 8

## 2023-06-11 MED ORDER — SODIUM CHLORIDE 0.9 % IV SOLN: Freq: Once | INTRAVENOUS | Status: AC

## 2023-06-11 MED ORDER — HEPARIN SOD (PORK) LOCK FLUSH 100 UNIT/ML IV SOLN
500.0000 [IU] | Freq: Once | INTRAVENOUS | Status: AC | PRN
Start: 2023-06-11 — End: 2023-06-11
  Administered 2023-06-11: 500 [IU]

## 2023-06-11 MED ORDER — SODIUM CHLORIDE 0.9% FLUSH
10.0000 mL | INTRAVENOUS | Status: DC | PRN
  Administered 2023-06-11: 10 mL

## 2023-06-12 LAB — T4: T4, Total: 6.5 ug/dL (ref 4.5–12.0)

## 2023-07-19 ENCOUNTER — Inpatient Hospital Stay: Attending: Hematology and Oncology

## 2023-07-19 ENCOUNTER — Ambulatory Visit (HOSPITAL_BASED_OUTPATIENT_CLINIC_OR_DEPARTMENT_OTHER)
Admission: RE | Admit: 2023-07-19 | Discharge: 2023-07-19 | Disposition: A | Discharge status: Home / Self Care | Source: Ambulatory Visit | Attending: Oncology | Admitting: Oncology

## 2023-07-19 ENCOUNTER — Other Ambulatory Visit (HOSPITAL_BASED_OUTPATIENT_CLINIC_OR_DEPARTMENT_OTHER): Admitting: Radiology

## 2023-07-19 ENCOUNTER — Other Ambulatory Visit

## 2023-07-19 DIAGNOSIS — J432 Centrilobular emphysema: Secondary | ICD-10-CM | POA: Diagnosis not present

## 2023-07-19 DIAGNOSIS — C3490 Malignant neoplasm of unspecified part of unspecified bronchus or lung: Secondary | ICD-10-CM | POA: Insufficient documentation

## 2023-07-19 DIAGNOSIS — C3481 Malignant neoplasm of overlapping sites of right bronchus and lung: Secondary | ICD-10-CM

## 2023-07-19 DIAGNOSIS — C801 Malignant (primary) neoplasm, unspecified: Secondary | ICD-10-CM | POA: Diagnosis not present

## 2023-07-19 DIAGNOSIS — Z5112 Encounter for antineoplastic immunotherapy: Secondary | ICD-10-CM | POA: Insufficient documentation

## 2023-07-19 DIAGNOSIS — I700 Atherosclerosis of aorta: Secondary | ICD-10-CM | POA: Diagnosis not present

## 2023-07-19 LAB — CBC WITH DIFFERENTIAL (CANCER CENTER ONLY)
Abs Immature Granulocytes: 0.02 10*3/uL (ref 0.00–0.07)
Basophils Absolute: 0.1 10*3/uL (ref 0.0–0.1)
Basophils Relative: 1 %
Eosinophils Absolute: 0.1 10*3/uL (ref 0.0–0.5)
Eosinophils Relative: 2 %
HCT: 43.2 % (ref 36.0–46.0)
Hemoglobin: 14.4 g/dL (ref 12.0–15.0)
Immature Granulocytes: 0 %
Lymphocytes Relative: 21 %
Lymphs Abs: 1.4 10*3/uL (ref 0.7–4.0)
MCH: 30.6 pg (ref 26.0–34.0)
MCHC: 33.3 g/dL (ref 30.0–36.0)
MCV: 91.7 fL (ref 80.0–100.0)
Monocytes Absolute: 0.7 10*3/uL (ref 0.1–1.0)
Monocytes Relative: 10 %
Neutro Abs: 4.4 10*3/uL (ref 1.7–7.7)
Neutrophils Relative %: 66 %
Platelet Count: 276 10*3/uL (ref 150–400)
RBC: 4.71 MIL/uL (ref 3.87–5.11)
RDW: 12.4 % (ref 11.5–15.5)
WBC Count: 6.7 10*3/uL (ref 4.0–10.5)
nRBC: 0 % (ref 0.0–0.2)

## 2023-07-19 LAB — CMP (CANCER CENTER ONLY)
ALT: 21 U/L (ref 0–44)
AST: 24 U/L (ref 15–41)
Albumin: 4.6 g/dL (ref 3.5–5.0)
Alkaline Phosphatase: 62 U/L (ref 38–126)
Anion gap: 11 (ref 5–15)
BUN: <5 mg/dL — ABNORMAL LOW (ref 6–20)
CO2: 24 mmol/L (ref 22–32)
Calcium: 9.3 mg/dL (ref 8.9–10.3)
Chloride: 100 mmol/L (ref 98–111)
Creatinine: 0.67 mg/dL (ref 0.44–1.00)
GFR, Estimated: >60 mL/min (ref >60)
Glucose, Bld: 81 mg/dL (ref 70–99)
Potassium: 4.2 mmol/L (ref 3.5–5.1)
Sodium: 135 mmol/L (ref 135–145)
Total Bilirubin: 0.3 mg/dL (ref 0.0–1.2)
Total Protein: 6.6 g/dL (ref 6.5–8.1)

## 2023-07-19 MED ORDER — HEPARIN SOD (PORK) LOCK FLUSH 100 UNIT/ML IV SOLN
500.0000 [IU] | Freq: Once | INTRAVENOUS | Status: AC
  Administered 2023-07-19: 500 [IU] via INTRAVENOUS

## 2023-07-19 MED ORDER — IOHEXOL 300 MG/ML  SOLN
100.0000 mL | Freq: Once | INTRAMUSCULAR | Status: AC | PRN
Start: 2023-07-19 — End: 2023-07-19
  Administered 2023-07-19: 75 mL via INTRAVENOUS

## 2023-07-22 NOTE — Progress Notes (Unsigned)
 [facility]  [address] [place],  [place]  [address] [phone]  Clinic Day:  07/23/2023  Referring physician: Venancio Pock, PA-C   HISTORY OF PRESENT ILLNESS:  The patient is a 58 y.o. female with stage IVA lung adenocarcinoma.  This staging is based upon initial scans showing bilateral lung involvement with her cancer.  She comes in today to go over her CT scans to ascertain her new disease baseline after 40 cycles of maintenance pembrolizumab  immunotherapy.  The patient claims to have tolerated her last cycle of treatment very well.  She denies having any recent shortness of breath, chest pain, or other respiratory symptoms which concern her for disease recurrence.      PHYSICAL EXAM:  Blood pressure 114/63, pulse 91, temperature 98.2 F (36.8 C), temperature source Oral, resp. rate 16, height 5' 5 (1.651 m), weight 113 lb 6.4 oz (51.4 kg), SpO2 92%.   Wt Readings from Last 3 Encounters:  07/23/23 113 lb 6.4 oz (51.4 kg)  06/11/23 111 lb (50.3 kg)  06/11/23 108 lb 8 oz (49.2 kg)   Body mass index is 18.87 kg/m. Performance status (ECOG): 1 - Symptomatic but completely ambulatory Physical Exam Constitutional:      Appearance: Normal appearance. She is not ill-appearing.  HENT:     Mouth/Throat:     Mouth: Mucous membranes are moist.     Pharynx: Oropharynx is clear. No oropharyngeal exudate or posterior oropharyngeal erythema.   Cardiovascular:     Rate and Rhythm: Normal rate and regular rhythm.     Heart sounds: No murmur heard.    No friction rub. No gallop.  Pulmonary:     Effort: Pulmonary effort is normal. No respiratory distress.     Breath sounds: Decreased breath sounds present. No wheezing, rhonchi or rales.  Abdominal:     General: Bowel sounds are normal. There is no distension.     Palpations: Abdomen is soft. There is no mass.     Tenderness: There is no abdominal tenderness.   Musculoskeletal:        General: No  swelling.     Right lower leg: No edema.     Left lower leg: No edema.  Lymphadenopathy:     Cervical: No cervical adenopathy.     Upper Body:     Right upper body: No supraclavicular or axillary adenopathy.     Left upper body: No supraclavicular or axillary adenopathy.     Lower Body: No right inguinal adenopathy. No left inguinal adenopathy.   Skin:    General: Skin is warm.     Coloration: Skin is not jaundiced.     Findings: No lesion or rash.   Neurological:     General: No focal deficit present.     Mental Status: She is alert and oriented to person, place, and time. Mental status is at baseline.   Psychiatric:        Mood and Affect: Mood normal.        Behavior: Behavior normal.        Thought Content: Thought content normal.   SCANS:  Her chest CT on 07-19-23 revealed the following: FINDINGS: Cardiovascular: Coronary artery calcification and aortic atherosclerotic calcification.   Mediastinum/Nodes: No axillary or supraclavicular adenopathy. No mediastinal or hilar adenopathy. No pericardial fluid. Esophagus normal.   Lungs/Pleura: Centrilobular emphysema the upper lobes.   Noncalcified 5 mm nodule in the LEFT lower lobe on image 67.   RIGHT lobe pulmonary  nodule measuring 6 mm (95/02/03/2000) unchanged from prior.   Interval resolution of peribronchial thickening peripheral consolidation in the posterior RIGHT lower lobe.   Additional small scattered calcified noncalcified nodules are unchanged. No new nodules are present.   Upper Abdomen:   Limited view of the liver, kidneys, pancreas are unremarkable. Normal adrenal glands.   Musculoskeletal: No aggressive osseous lesion. Anterior posterior cervical fusion.   IMPRESSION: 1. Interval resolution of peribronchial thickening and peripheral consolidation in the posterior RIGHT lower lobe. 2. Stable small pulmonary nodules. 3. No evidence of thoracic metastatic disease. 4. Aortic Atherosclerosis  (ICD10-I70.0) and Emphysema (ICD10-J43.9).  LABS:      Latest Ref Rng & Units 07/19/2023    1:53 PM 06/11/2023    1:33 PM 04/30/2023    9:25 AM  CBC  WBC 4.0 - 10.5 K/uL 6.7  6.9  6.0   Hemoglobin 12.0 - 15.0 g/dL 85.5  86.5  87.7   Hematocrit 36.0 - 46.0 % 43.2  41.1  36.1   Platelets 150 - 400 K/uL 276  259  303       Latest Ref Rng & Units 07/19/2023    1:53 PM 06/11/2023    1:33 PM 04/30/2023    9:25 AM  CMP  Glucose 70 - 99 mg/dL 81  898  82   BUN 6 - 20 mg/dL 5  11  8    Creatinine 0.44 - 1.00 mg/dL 9.32  9.36  9.37   Sodium 135 - 145 mmol/L 135  133  138   Potassium 3.5 - 5.1 mmol/L 4.2  3.9  4.0   Chloride 98 - 111 mmol/L 100  101  102   CO2 22 - 32 mmol/L 24  21  24    Calcium 8.9 - 10.3 mg/dL 9.3  9.1  9.0   Total Protein 6.5 - 8.1 g/dL 6.6  6.3  6.1   Total Bilirubin 0.0 - 1.2 mg/dL 0.3  0.3  0.4   Alkaline Phos 38 - 126 U/L 62  52  50   AST 15 - 41 U/L 24  21  21    ALT 0 - 44 U/L 21  19  15     ASSESSMENT & PLAN:  Assessment/Plan:  A 58 y.o. female with stage IVA lung adenocarcinoma.  In clinic today, went over all of her CT scan images with her, for which she could see that her disease remains under ideal control.  Based upon this, she will proceed with her 41st cycle of pembrolizumab  today.  Clinically, the patient appears to be doing well.  I will see her back in 6 weeks before she heads into her 42nd cycle of pembrolizumab  immunotherapy.  The patient understands all the plans discussed today and is in agreement with them.   Chamari Cutbirth DELENA Kerns, MD

## 2023-07-23 ENCOUNTER — Inpatient Hospital Stay: Admitting: Oncology

## 2023-07-23 ENCOUNTER — Telehealth: Payer: Self-pay | Admitting: Oncology

## 2023-07-23 ENCOUNTER — Inpatient Hospital Stay

## 2023-07-23 ENCOUNTER — Telehealth: Payer: Self-pay | Admitting: Hematology and Oncology

## 2023-07-23 ENCOUNTER — Other Ambulatory Visit

## 2023-07-23 VITALS — BP 114/63 | HR 91 | Temp 98.2°F | Resp 16 | Ht 65.0 in | Wt 113.4 lb

## 2023-07-23 DIAGNOSIS — C3490 Malignant neoplasm of unspecified part of unspecified bronchus or lung: Secondary | ICD-10-CM | POA: Diagnosis not present

## 2023-07-23 DIAGNOSIS — Z5112 Encounter for antineoplastic immunotherapy: Secondary | ICD-10-CM | POA: Diagnosis not present

## 2023-07-23 DIAGNOSIS — C3481 Malignant neoplasm of overlapping sites of right bronchus and lung: Secondary | ICD-10-CM

## 2023-07-23 MED ORDER — HEPARIN SOD (PORK) LOCK FLUSH 100 UNIT/ML IV SOLN
500.0000 [IU] | Freq: Once | INTRAVENOUS | Status: AC | PRN
Start: 2023-07-23 — End: 2023-07-23
  Administered 2023-07-23: 500 [IU]

## 2023-07-23 MED ORDER — SODIUM CHLORIDE 0.9 % IV SOLN
200.0000 mg | Freq: Once | INTRAVENOUS | Status: AC
  Administered 2023-07-23: 200 mg via INTRAVENOUS
  Filled 2023-07-23: qty 200

## 2023-07-23 MED ORDER — SODIUM CHLORIDE 0.9% FLUSH
10.0000 mL | INTRAVENOUS | Status: DC | PRN
  Administered 2023-07-23: 10 mL

## 2023-07-23 MED ORDER — SODIUM CHLORIDE 0.9 % IV SOLN: Freq: Once | INTRAVENOUS | Status: AC

## 2023-07-23 NOTE — Telephone Encounter (Signed)
 Patient has been scheduled for follow-up visit per 07/23/23 LOS.  Pt given an appt calendar with date and time.

## 2023-07-23 NOTE — Patient Instructions (Signed)

## 2023-08-13 DIAGNOSIS — M5416 Radiculopathy, lumbar region: Secondary | ICD-10-CM | POA: Diagnosis not present

## 2023-08-13 DIAGNOSIS — J449 Chronic obstructive pulmonary disease, unspecified: Secondary | ICD-10-CM | POA: Diagnosis not present

## 2023-08-13 DIAGNOSIS — E785 Hyperlipidemia, unspecified: Secondary | ICD-10-CM | POA: Diagnosis not present

## 2023-08-13 DIAGNOSIS — Z79899 Other long term (current) drug therapy: Secondary | ICD-10-CM | POA: Diagnosis not present

## 2023-08-13 DIAGNOSIS — I10 Essential (primary) hypertension: Secondary | ICD-10-CM | POA: Diagnosis not present

## 2023-09-03 ENCOUNTER — Inpatient Hospital Stay

## 2023-09-03 ENCOUNTER — Inpatient Hospital Stay: Admitting: Hematology and Oncology

## 2023-09-03 ENCOUNTER — Telehealth: Payer: Self-pay | Admitting: Hematology and Oncology

## 2023-09-03 ENCOUNTER — Inpatient Hospital Stay: Attending: Hematology and Oncology

## 2023-09-03 ENCOUNTER — Other Ambulatory Visit: Payer: Self-pay

## 2023-09-03 VITALS — BP 109/64 | HR 84 | Temp 98.6°F | Resp 14

## 2023-09-03 DIAGNOSIS — Z79620 Long term (current) use of immunosuppressive biologic: Secondary | ICD-10-CM | POA: Insufficient documentation

## 2023-09-03 DIAGNOSIS — C3490 Malignant neoplasm of unspecified part of unspecified bronchus or lung: Secondary | ICD-10-CM | POA: Insufficient documentation

## 2023-09-03 DIAGNOSIS — C3481 Malignant neoplasm of overlapping sites of right bronchus and lung: Secondary | ICD-10-CM

## 2023-09-03 DIAGNOSIS — Z5112 Encounter for antineoplastic immunotherapy: Secondary | ICD-10-CM | POA: Insufficient documentation

## 2023-09-03 LAB — CBC WITH DIFFERENTIAL (CANCER CENTER ONLY)
Abs Immature Granulocytes: 0.03 K/uL (ref 0.00–0.07)
Basophils Absolute: 0.1 K/uL (ref 0.0–0.1)
Basophils Relative: 1 %
Eosinophils Absolute: 0.2 K/uL (ref 0.0–0.5)
Eosinophils Relative: 2 %
HCT: 39 % (ref 36.0–46.0)
Hemoglobin: 13.1 g/dL (ref 12.0–15.0)
Immature Granulocytes: 0 %
Lymphocytes Relative: 21 %
Lymphs Abs: 1.7 K/uL (ref 0.7–4.0)
MCH: 31 pg (ref 26.0–34.0)
MCHC: 33.6 g/dL (ref 30.0–36.0)
MCV: 92.4 fL (ref 80.0–100.0)
Monocytes Absolute: 0.7 K/uL (ref 0.1–1.0)
Monocytes Relative: 9 %
Neutro Abs: 5.2 K/uL (ref 1.7–7.7)
Neutrophils Relative %: 67 %
Platelet Count: 302 K/uL (ref 150–400)
RBC: 4.22 MIL/uL (ref 3.87–5.11)
RDW: 13.3 % (ref 11.5–15.5)
WBC Count: 7.9 K/uL (ref 4.0–10.5)
nRBC: 0 % (ref 0.0–0.2)

## 2023-09-03 LAB — TSH: TSH: 1.676 u[IU]/mL (ref 0.350–4.500)

## 2023-09-03 LAB — CMP (CANCER CENTER ONLY)
ALT: 17 U/L (ref 0–44)
AST: 21 U/L (ref 15–41)
Albumin: 4.3 g/dL (ref 3.5–5.0)
Alkaline Phosphatase: 56 U/L (ref 38–126)
Anion gap: 11 (ref 5–15)
BUN: 8 mg/dL (ref 6–20)
CO2: 24 mmol/L (ref 22–32)
Calcium: 9.2 mg/dL (ref 8.9–10.3)
Chloride: 102 mmol/L (ref 98–111)
Creatinine: 0.8 mg/dL (ref 0.44–1.00)
GFR, Estimated: >60 mL/min (ref >60)
Glucose, Bld: 97 mg/dL (ref 70–99)
Potassium: 4 mmol/L (ref 3.5–5.1)
Sodium: 138 mmol/L (ref 135–145)
Total Bilirubin: 0.4 mg/dL (ref 0.0–1.2)
Total Protein: 6.4 g/dL — ABNORMAL LOW (ref 6.5–8.1)

## 2023-09-03 MED ORDER — SODIUM CHLORIDE 0.9 % IV SOLN
200.0000 mg | Freq: Once | INTRAVENOUS | Status: AC
  Administered 2023-09-03 (×2): 200 mg via INTRAVENOUS
  Filled 2023-09-03: qty 200

## 2023-09-03 MED ORDER — SODIUM CHLORIDE 0.9 % IV SOLN
Freq: Once | INTRAVENOUS | Status: AC
Start: 2023-09-03 — End: 2023-09-03

## 2023-09-03 NOTE — Patient Instructions (Signed)

## 2023-09-03 NOTE — Telephone Encounter (Signed)
 Patient has been scheduled for follow-up visit per 09/03/23 LOS.  Pt given an appt calendar with date and time.

## 2023-09-03 NOTE — Progress Notes (Signed)
 [facility]  [address] [place],  [place]  [address] [phone]  Clinic Day:  09/03/2023  Referring physician: Venancio Pock, PA-C   HISTORY OF PRESENT ILLNESS:  The patient is a 58 y.o. female with stage IVA lung adenocarcinoma.  This staging is based upon initial scans showing bilateral lung involvement with her cancer.  At last visit, she reviewed her  CT scans to ascertain her new disease baseline after 40 cycles of maintenance pembrolizumab  immunotherapy.  The patient claims to have tolerated her last cycle of treatment very well.  She denies having any recent shortness of breath, chest pain, or other respiratory symptoms which concern her for disease recurrence.      PHYSICAL EXAM:  There were no vitals taken for this visit.   Wt Readings from Last 3 Encounters:  07/23/23 113 lb 6.4 oz (51.4 kg)  06/11/23 111 lb (50.3 kg)  06/11/23 108 lb 8 oz (49.2 kg)   There is no height or weight on file to calculate BMI. Performance status (ECOG): 1 - Symptomatic but completely ambulatory Physical Exam Constitutional:      Appearance: Normal appearance. She is not ill-appearing.  HENT:     Mouth/Throat:     Mouth: Mucous membranes are moist.     Pharynx: Oropharynx is clear. No oropharyngeal exudate or posterior oropharyngeal erythema.  Cardiovascular:     Rate and Rhythm: Normal rate and regular rhythm.     Heart sounds: No murmur heard.    No friction rub. No gallop.  Pulmonary:     Effort: Pulmonary effort is normal. No respiratory distress.     Breath sounds: Decreased breath sounds present. No wheezing, rhonchi or rales.  Abdominal:     General: Bowel sounds are normal. There is no distension.     Palpations: Abdomen is soft. There is no mass.     Tenderness: There is no abdominal tenderness.  Musculoskeletal:        General: No swelling.     Right lower leg: No edema.     Left lower leg: No edema.  Lymphadenopathy:     Cervical: No  cervical adenopathy.     Upper Body:     Right upper body: No supraclavicular or axillary adenopathy.     Left upper body: No supraclavicular or axillary adenopathy.     Lower Body: No right inguinal adenopathy. No left inguinal adenopathy.  Skin:    General: Skin is warm.     Coloration: Skin is not jaundiced.     Findings: No lesion or rash.  Neurological:     General: No focal deficit present.     Mental Status: She is alert and oriented to person, place, and time. Mental status is at baseline.  Psychiatric:        Mood and Affect: Mood normal.        Behavior: Behavior normal.        Thought Content: Thought content normal.   SCANS:  Her chest CT on 07-19-23 revealed the following: FINDINGS: Cardiovascular: Coronary artery calcification and aortic atherosclerotic calcification.   Mediastinum/Nodes: No axillary or supraclavicular adenopathy. No mediastinal or hilar adenopathy. No pericardial fluid. Esophagus normal.   Lungs/Pleura: Centrilobular emphysema the upper lobes.   Noncalcified 5 mm nodule in the LEFT lower lobe on image 67.   RIGHT lobe pulmonary nodule measuring 6 mm (95/02/03/2000) unchanged from prior.   Interval resolution of peribronchial thickening peripheral consolidation in the posterior RIGHT lower lobe.  Additional small scattered calcified noncalcified nodules are unchanged. No new nodules are present.   Upper Abdomen:   Limited view of the liver, kidneys, pancreas are unremarkable. Normal adrenal glands.   Musculoskeletal: No aggressive osseous lesion. Anterior posterior cervical fusion.   IMPRESSION: 1. Interval resolution of peribronchial thickening and peripheral consolidation in the posterior RIGHT lower lobe. 2. Stable small pulmonary nodules. 3. No evidence of thoracic metastatic disease. 4. Aortic Atherosclerosis (ICD10-I70.0) and Emphysema (ICD10-J43.9).  LABS:      Latest Ref Rng & Units 07/19/2023    1:53 PM 06/11/2023    1:33  PM 04/30/2023    9:25 AM  CBC  WBC 4.0 - 10.5 K/uL 6.7  6.9  6.0   Hemoglobin 12.0 - 15.0 g/dL 85.5  86.5  87.7   Hematocrit 36.0 - 46.0 % 43.2  41.1  36.1   Platelets 150 - 400 K/uL 276  259  303       Latest Ref Rng & Units 07/19/2023    1:53 PM 06/11/2023    1:33 PM 04/30/2023    9:25 AM  CMP  Glucose 70 - 99 mg/dL 81  898  82   BUN 6 - 20 mg/dL 5  11  8    Creatinine 0.44 - 1.00 mg/dL 9.32  9.36  9.37   Sodium 135 - 145 mmol/L 135  133  138   Potassium 3.5 - 5.1 mmol/L 4.2  3.9  4.0   Chloride 98 - 111 mmol/L 100  101  102   CO2 22 - 32 mmol/L 24  21  24    Calcium 8.9 - 10.3 mg/dL 9.3  9.1  9.0   Total Protein 6.5 - 8.1 g/dL 6.6  6.3  6.1   Total Bilirubin 0.0 - 1.2 mg/dL 0.3  0.3  0.4   Alkaline Phos 38 - 126 U/L 62  52  50   AST 15 - 41 U/L 24  21  21    ALT 0 - 44 U/L 21  19  15     ASSESSMENT & PLAN:  Assessment/Plan:  A 58 y.o. female with stage IVA lung adenocarcinoma.  She is tolerating treatment well and denies any new side effects or symptoms. She will proceed with cycle 42 today and return to clinic in 6 weeks.   Eleanor DELENA Bach, NP

## 2023-09-04 LAB — T4: T4, Total: 7.9 ug/dL (ref 4.5–12.0)

## 2023-09-26 ENCOUNTER — Encounter: Payer: Self-pay | Admitting: Oncology

## 2023-10-09 ENCOUNTER — Other Ambulatory Visit: Payer: Self-pay | Admitting: Oncology

## 2023-10-09 DIAGNOSIS — C3481 Malignant neoplasm of overlapping sites of right bronchus and lung: Secondary | ICD-10-CM

## 2023-10-12 DIAGNOSIS — G629 Polyneuropathy, unspecified: Secondary | ICD-10-CM | POA: Diagnosis not present

## 2023-10-12 DIAGNOSIS — B9620 Unspecified Escherichia coli [E. coli] as the cause of diseases classified elsewhere: Secondary | ICD-10-CM | POA: Diagnosis not present

## 2023-10-12 DIAGNOSIS — R0602 Shortness of breath: Secondary | ICD-10-CM | POA: Diagnosis not present

## 2023-10-12 DIAGNOSIS — F17210 Nicotine dependence, cigarettes, uncomplicated: Secondary | ICD-10-CM | POA: Diagnosis not present

## 2023-10-12 DIAGNOSIS — C3492 Malignant neoplasm of unspecified part of left bronchus or lung: Secondary | ICD-10-CM | POA: Diagnosis not present

## 2023-10-12 DIAGNOSIS — E876 Hypokalemia: Secondary | ICD-10-CM | POA: Diagnosis not present

## 2023-10-12 DIAGNOSIS — I4519 Other right bundle-branch block: Secondary | ICD-10-CM | POA: Diagnosis not present

## 2023-10-12 DIAGNOSIS — Z85118 Personal history of other malignant neoplasm of bronchus and lung: Secondary | ICD-10-CM | POA: Diagnosis not present

## 2023-10-12 DIAGNOSIS — Z8701 Personal history of pneumonia (recurrent): Secondary | ICD-10-CM | POA: Diagnosis not present

## 2023-10-12 DIAGNOSIS — Z681 Body mass index (BMI) 19 or less, adult: Secondary | ICD-10-CM | POA: Diagnosis not present

## 2023-10-12 DIAGNOSIS — J189 Pneumonia, unspecified organism: Secondary | ICD-10-CM | POA: Diagnosis not present

## 2023-10-12 DIAGNOSIS — N390 Urinary tract infection, site not specified: Secondary | ICD-10-CM | POA: Diagnosis not present

## 2023-10-12 DIAGNOSIS — C779 Secondary and unspecified malignant neoplasm of lymph node, unspecified: Secondary | ICD-10-CM | POA: Diagnosis not present

## 2023-10-12 DIAGNOSIS — Z792 Long term (current) use of antibiotics: Secondary | ICD-10-CM | POA: Diagnosis not present

## 2023-10-12 DIAGNOSIS — M47816 Spondylosis without myelopathy or radiculopathy, lumbar region: Secondary | ICD-10-CM | POA: Diagnosis not present

## 2023-10-12 DIAGNOSIS — Z7982 Long term (current) use of aspirin: Secondary | ICD-10-CM | POA: Diagnosis not present

## 2023-10-12 DIAGNOSIS — A419 Sepsis, unspecified organism: Secondary | ICD-10-CM | POA: Diagnosis not present

## 2023-10-12 DIAGNOSIS — J440 Chronic obstructive pulmonary disease with acute lower respiratory infection: Secondary | ICD-10-CM | POA: Diagnosis not present

## 2023-10-12 DIAGNOSIS — J159 Unspecified bacterial pneumonia: Secondary | ICD-10-CM | POA: Diagnosis not present

## 2023-10-12 DIAGNOSIS — R9431 Abnormal electrocardiogram [ECG] [EKG]: Secondary | ICD-10-CM | POA: Diagnosis not present

## 2023-10-12 DIAGNOSIS — Z79899 Other long term (current) drug therapy: Secondary | ICD-10-CM | POA: Diagnosis not present

## 2023-10-12 DIAGNOSIS — R509 Fever, unspecified: Secondary | ICD-10-CM | POA: Diagnosis not present

## 2023-10-12 DIAGNOSIS — J4540 Moderate persistent asthma, uncomplicated: Secondary | ICD-10-CM | POA: Diagnosis not present

## 2023-10-12 DIAGNOSIS — E7800 Pure hypercholesterolemia, unspecified: Secondary | ICD-10-CM | POA: Diagnosis not present

## 2023-10-12 DIAGNOSIS — I10 Essential (primary) hypertension: Secondary | ICD-10-CM | POA: Diagnosis not present

## 2023-10-12 DIAGNOSIS — I739 Peripheral vascular disease, unspecified: Secondary | ICD-10-CM | POA: Diagnosis not present

## 2023-10-12 DIAGNOSIS — E43 Unspecified severe protein-calorie malnutrition: Secondary | ICD-10-CM | POA: Diagnosis not present

## 2023-10-12 DIAGNOSIS — C3491 Malignant neoplasm of unspecified part of right bronchus or lung: Secondary | ICD-10-CM | POA: Diagnosis not present

## 2023-10-13 DIAGNOSIS — E43 Unspecified severe protein-calorie malnutrition: Secondary | ICD-10-CM | POA: Diagnosis not present

## 2023-10-13 DIAGNOSIS — J159 Unspecified bacterial pneumonia: Secondary | ICD-10-CM | POA: Diagnosis not present

## 2023-10-13 DIAGNOSIS — J440 Chronic obstructive pulmonary disease with acute lower respiratory infection: Secondary | ICD-10-CM | POA: Diagnosis not present

## 2023-10-14 DIAGNOSIS — E43 Unspecified severe protein-calorie malnutrition: Secondary | ICD-10-CM | POA: Diagnosis not present

## 2023-10-14 DIAGNOSIS — J159 Unspecified bacterial pneumonia: Secondary | ICD-10-CM | POA: Diagnosis not present

## 2023-10-14 DIAGNOSIS — J440 Chronic obstructive pulmonary disease with acute lower respiratory infection: Secondary | ICD-10-CM | POA: Diagnosis not present

## 2023-10-14 NOTE — Progress Notes (Deleted)
 [facility]  [address] [place],  [place]  [address] [phone]  Clinic Day:  10/14/2023  Referring physician: Venancio Pock, PA-C   HISTORY OF PRESENT ILLNESS:  The patient is a 58 y.o. female with stage IVA lung adenocarcinoma.  This staging is based upon initial scans showing bilateral lung involvement with her cancer.  She comes in today to be evaluated before heading into her 43rd cycle of maintenance pembrolizumab  immunotherapy.  The patient claims to have tolerated her last cycle of treatment very well.  She denies having any recent shortness of breath, chest pain, or other respiratory symptoms which concern her for disease recurrence.      PHYSICAL EXAM:  There were no vitals taken for this visit.   Wt Readings from Last 3 Encounters:  07/23/23 113 lb 6.4 oz (51.4 kg)  06/11/23 111 lb (50.3 kg)  06/11/23 108 lb 8 oz (49.2 kg)   There is no height or weight on file to calculate BMI. Performance status (ECOG): 1 - Symptomatic but completely ambulatory Physical Exam Constitutional:      Appearance: Normal appearance. She is not ill-appearing.  HENT:     Mouth/Throat:     Mouth: Mucous membranes are moist.     Pharynx: Oropharynx is clear. No oropharyngeal exudate or posterior oropharyngeal erythema.  Cardiovascular:     Rate and Rhythm: Normal rate and regular rhythm.     Heart sounds: No murmur heard.    No friction rub. No gallop.  Pulmonary:     Effort: Pulmonary effort is normal. No respiratory distress.     Breath sounds: Decreased breath sounds present. No wheezing, rhonchi or rales.  Abdominal:     General: Bowel sounds are normal. There is no distension.     Palpations: Abdomen is soft. There is no mass.     Tenderness: There is no abdominal tenderness.  Musculoskeletal:        General: No swelling.     Right lower leg: No edema.     Left lower leg: No edema.  Lymphadenopathy:     Cervical: No cervical adenopathy.      Upper Body:     Right upper body: No supraclavicular or axillary adenopathy.     Left upper body: No supraclavicular or axillary adenopathy.     Lower Body: No right inguinal adenopathy. No left inguinal adenopathy.  Skin:    General: Skin is warm.     Coloration: Skin is not jaundiced.     Findings: No lesion or rash.  Neurological:     General: No focal deficit present.     Mental Status: She is alert and oriented to person, place, and time. Mental status is at baseline.  Psychiatric:        Mood and Affect: Mood normal.        Behavior: Behavior normal.        Thought Content: Thought content normal.   SCANS:  Her chest CT on 07-19-23 revealed the following: FINDINGS: Cardiovascular: Coronary artery calcification and aortic atherosclerotic calcification.   Mediastinum/Nodes: No axillary or supraclavicular adenopathy. No mediastinal or hilar adenopathy. No pericardial fluid. Esophagus normal.   Lungs/Pleura: Centrilobular emphysema the upper lobes.   Noncalcified 5 mm nodule in the LEFT lower lobe on image 67.   RIGHT lobe pulmonary nodule measuring 6 mm (95/02/03/2000) unchanged from prior.   Interval resolution of peribronchial thickening peripheral consolidation in the posterior RIGHT lower lobe.   Additional small scattered  calcified noncalcified nodules are unchanged. No new nodules are present.   Upper Abdomen:   Limited view of the liver, kidneys, pancreas are unremarkable. Normal adrenal glands.   Musculoskeletal: No aggressive osseous lesion. Anterior posterior cervical fusion.   IMPRESSION: 1. Interval resolution of peribronchial thickening and peripheral consolidation in the posterior RIGHT lower lobe. 2. Stable small pulmonary nodules. 3. No evidence of thoracic metastatic disease. 4. Aortic Atherosclerosis (ICD10-I70.0) and Emphysema (ICD10-J43.9).  LABS:      Latest Ref Rng & Units 09/03/2023    1:33 PM 07/19/2023    1:53 PM 06/11/2023    1:33 PM   CBC  WBC 4.0 - 10.5 K/uL 7.9  6.7  6.9   Hemoglobin 12.0 - 15.0 g/dL 86.8  85.5  86.5   Hematocrit 36.0 - 46.0 % 39.0  43.2  41.1   Platelets 150 - 400 K/uL 302  276  259       Latest Ref Rng & Units 09/03/2023    1:33 PM 07/19/2023    1:53 PM 06/11/2023    1:33 PM  CMP  Glucose 70 - 99 mg/dL 97  81  898   BUN 6 - 20 mg/dL 8  <5  11   Creatinine 0.44 - 1.00 mg/dL 9.19  9.32  9.36   Sodium 135 - 145 mmol/L 138  135  133   Potassium 3.5 - 5.1 mmol/L 4.0  4.2  3.9   Chloride 98 - 111 mmol/L 102  100  101   CO2 22 - 32 mmol/L 24  24  21    Calcium 8.9 - 10.3 mg/dL 9.2  9.3  9.1   Total Protein 6.5 - 8.1 g/dL 6.4  6.6  6.3   Total Bilirubin 0.0 - 1.2 mg/dL 0.4  0.3  0.3   Alkaline Phos 38 - 126 U/L 56  62  52   AST 15 - 41 U/L 21  24  21    ALT 0 - 44 U/L 17  21  19     ASSESSMENT & PLAN:  Assessment/Plan:  A 58 y.o. female with stage IVA lung adenocarcinoma.  In clinic today, went over all of her CT scan images with her, for which she could see that her disease remains under ideal control.  Based upon this, she will proceed with her 41st cycle of pembrolizumab  today.  Clinically, the patient appears to be doing well.  I will see her back in 6 weeks before she heads into her 42nd cycle of pembrolizumab  immunotherapy.  The patient understands all the plans discussed today and is in agreement with them.   Cayman Brogden DELENA Kerns, MD

## 2023-10-15 ENCOUNTER — Inpatient Hospital Stay

## 2023-10-15 ENCOUNTER — Telehealth: Payer: Self-pay

## 2023-10-15 ENCOUNTER — Inpatient Hospital Stay: Admitting: Oncology

## 2023-10-15 NOTE — Telephone Encounter (Signed)
 Pt called in to report she was in hospital over weekend with PNA, and severe UTI. She was discharged yesterday and wants to know if she should come today for tx as scheduled? Pt sent home with has 5 days of antibiotic. I notified Dr Ezzard, he wants to move her appt out 1 week. I notified infusion nurse, pharmacist, and scheduling.

## 2023-10-21 NOTE — Progress Notes (Unsigned)
 [facility]  [address] [place],  [place]  [address] [phone]  Clinic Day:  10/21/2023  Referring physician: Venancio Pock, PA-C   HISTORY OF PRESENT ILLNESS:  The patient is a 58 y.o. female with stage IVA lung adenocarcinoma.  This staging is based upon initial scans showing bilateral lung involvement with her cancer.  She comes in today to be evaluated before heading into her 43rd cycle of maintenance pembrolizumab  immunotherapy.  The patient claims to have tolerated her last cycle of treatment very well.  She denies having any recent shortness of breath, chest pain, or other respiratory symptoms which concern her for disease recurrence.      PHYSICAL EXAM:  There were no vitals taken for this visit.   Wt Readings from Last 3 Encounters:  07/23/23 113 lb 6.4 oz (51.4 kg)  06/11/23 111 lb (50.3 kg)  06/11/23 108 lb 8 oz (49.2 kg)   There is no height or weight on file to calculate BMI. Performance status (ECOG): 1 - Symptomatic but completely ambulatory Physical Exam Constitutional:      Appearance: Normal appearance. She is not ill-appearing.  HENT:     Mouth/Throat:     Mouth: Mucous membranes are moist.     Pharynx: Oropharynx is clear. No oropharyngeal exudate or posterior oropharyngeal erythema.  Cardiovascular:     Rate and Rhythm: Normal rate and regular rhythm.     Heart sounds: No murmur heard.    No friction rub. No gallop.  Pulmonary:     Effort: Pulmonary effort is normal. No respiratory distress.     Breath sounds: Decreased breath sounds present. No wheezing, rhonchi or rales.  Abdominal:     General: Bowel sounds are normal. There is no distension.     Palpations: Abdomen is soft. There is no mass.     Tenderness: There is no abdominal tenderness.  Musculoskeletal:        General: No swelling.     Right lower leg: No edema.     Left lower leg: No edema.  Lymphadenopathy:     Cervical: No cervical adenopathy.      Upper Body:     Right upper body: No supraclavicular or axillary adenopathy.     Left upper body: No supraclavicular or axillary adenopathy.     Lower Body: No right inguinal adenopathy. No left inguinal adenopathy.  Skin:    General: Skin is warm.     Coloration: Skin is not jaundiced.     Findings: No lesion or rash.  Neurological:     General: No focal deficit present.     Mental Status: She is alert and oriented to person, place, and time. Mental status is at baseline.  Psychiatric:        Mood and Affect: Mood normal.        Behavior: Behavior normal.        Thought Content: Thought content normal.   SCANS:  Her chest CT on 07-19-23 revealed the following: FINDINGS: Cardiovascular: Coronary artery calcification and aortic atherosclerotic calcification.   Mediastinum/Nodes: No axillary or supraclavicular adenopathy. No mediastinal or hilar adenopathy. No pericardial fluid. Esophagus normal.   Lungs/Pleura: Centrilobular emphysema the upper lobes.   Noncalcified 5 mm nodule in the LEFT lower lobe on image 67.   RIGHT lobe pulmonary nodule measuring 6 mm (95/02/03/2000) unchanged from prior.   Interval resolution of peribronchial thickening peripheral consolidation in the posterior RIGHT lower lobe.   Additional small scattered  calcified noncalcified nodules are unchanged. No new nodules are present.   Upper Abdomen:   Limited view of the liver, kidneys, pancreas are unremarkable. Normal adrenal glands.   Musculoskeletal: No aggressive osseous lesion. Anterior posterior cervical fusion.   IMPRESSION: 1. Interval resolution of peribronchial thickening and peripheral consolidation in the posterior RIGHT lower lobe. 2. Stable small pulmonary nodules. 3. No evidence of thoracic metastatic disease. 4. Aortic Atherosclerosis (ICD10-I70.0) and Emphysema (ICD10-J43.9).  LABS:      Latest Ref Rng & Units 09/03/2023    1:33 PM 07/19/2023    1:53 PM 06/11/2023    1:33 PM   CBC  WBC 4.0 - 10.5 K/uL 7.9  6.7  6.9   Hemoglobin 12.0 - 15.0 g/dL 86.8  85.5  86.5   Hematocrit 36.0 - 46.0 % 39.0  43.2  41.1   Platelets 150 - 400 K/uL 302  276  259       Latest Ref Rng & Units 09/03/2023    1:33 PM 07/19/2023    1:53 PM 06/11/2023    1:33 PM  CMP  Glucose 70 - 99 mg/dL 97  81  898   BUN 6 - 20 mg/dL 8  <5  11   Creatinine 0.44 - 1.00 mg/dL 9.19  9.32  9.36   Sodium 135 - 145 mmol/L 138  135  133   Potassium 3.5 - 5.1 mmol/L 4.0  4.2  3.9   Chloride 98 - 111 mmol/L 102  100  101   CO2 22 - 32 mmol/L 24  24  21    Calcium 8.9 - 10.3 mg/dL 9.2  9.3  9.1   Total Protein 6.5 - 8.1 g/dL 6.4  6.6  6.3   Total Bilirubin 0.0 - 1.2 mg/dL 0.4  0.3  0.3   Alkaline Phos 38 - 126 U/L 56  62  52   AST 15 - 41 U/L 21  24  21    ALT 0 - 44 U/L 17  21  19     ASSESSMENT & PLAN:  Assessment/Plan:  A 58 y.o. female with stage IVA lung adenocarcinoma.  In clinic today, went over all of her CT scan images with her, for which she could see that her disease remains under ideal control.  Based upon this, she will proceed with her 41st cycle of pembrolizumab  today.  Clinically, the patient appears to be doing well.  I will see her back in 6 weeks before she heads into her 42nd cycle of pembrolizumab  immunotherapy.  The patient understands all the plans discussed today and is in agreement with them.   Jamaar Howes DELENA Kerns, MD

## 2023-10-22 ENCOUNTER — Inpatient Hospital Stay (HOSPITAL_BASED_OUTPATIENT_CLINIC_OR_DEPARTMENT_OTHER): Admitting: Oncology

## 2023-10-22 ENCOUNTER — Inpatient Hospital Stay

## 2023-10-22 ENCOUNTER — Inpatient Hospital Stay: Attending: Hematology and Oncology

## 2023-10-22 ENCOUNTER — Encounter: Payer: Self-pay | Admitting: Oncology

## 2023-10-22 ENCOUNTER — Telehealth: Payer: Self-pay | Admitting: Oncology

## 2023-10-22 VITALS — BP 139/72 | HR 76 | Temp 97.7°F | Resp 16 | Ht 65.0 in | Wt 102.7 lb

## 2023-10-22 DIAGNOSIS — Z87891 Personal history of nicotine dependence: Secondary | ICD-10-CM | POA: Diagnosis not present

## 2023-10-22 DIAGNOSIS — C3481 Malignant neoplasm of overlapping sites of right bronchus and lung: Secondary | ICD-10-CM

## 2023-10-22 DIAGNOSIS — C3490 Malignant neoplasm of unspecified part of unspecified bronchus or lung: Secondary | ICD-10-CM | POA: Diagnosis not present

## 2023-10-22 DIAGNOSIS — Z79620 Long term (current) use of immunosuppressive biologic: Secondary | ICD-10-CM | POA: Diagnosis not present

## 2023-10-22 LAB — CBC WITH DIFFERENTIAL (CANCER CENTER ONLY)
Abs Immature Granulocytes: 0.04 K/uL (ref 0.00–0.07)
Basophils Absolute: 0.2 K/uL — ABNORMAL HIGH (ref 0.0–0.1)
Basophils Relative: 3 %
Eosinophils Absolute: 0.4 K/uL (ref 0.0–0.5)
Eosinophils Relative: 6 %
HCT: 38 % (ref 36.0–46.0)
Hemoglobin: 12.8 g/dL (ref 12.0–15.0)
Immature Granulocytes: 1 %
Lymphocytes Relative: 24 %
Lymphs Abs: 1.7 K/uL (ref 0.7–4.0)
MCH: 31.1 pg (ref 26.0–34.0)
MCHC: 33.7 g/dL (ref 30.0–36.0)
MCV: 92.2 fL (ref 80.0–100.0)
Monocytes Absolute: 0.5 K/uL (ref 0.1–1.0)
Monocytes Relative: 7 %
Neutro Abs: 4.3 K/uL (ref 1.7–7.7)
Neutrophils Relative %: 59 %
Platelet Count: 444 K/uL — ABNORMAL HIGH (ref 150–400)
RBC: 4.12 MIL/uL (ref 3.87–5.11)
RDW: 13.2 % (ref 11.5–15.5)
WBC Count: 7.1 K/uL (ref 4.0–10.5)
nRBC: 0 % (ref 0.0–0.2)

## 2023-10-22 LAB — CMP (CANCER CENTER ONLY)
ALT: 17 U/L (ref 0–44)
AST: 20 U/L (ref 15–41)
Albumin: 3.7 g/dL (ref 3.5–5.0)
Alkaline Phosphatase: 56 U/L (ref 38–126)
Anion gap: 10 (ref 5–15)
BUN: 6 mg/dL (ref 6–20)
CO2: 26 mmol/L (ref 22–32)
Calcium: 9 mg/dL (ref 8.9–10.3)
Chloride: 100 mmol/L (ref 98–111)
Creatinine: 0.57 mg/dL (ref 0.44–1.00)
GFR, Estimated: >60 mL/min (ref >60)
Glucose, Bld: 96 mg/dL (ref 70–99)
Potassium: 4 mmol/L (ref 3.5–5.1)
Sodium: 136 mmol/L (ref 135–145)
Total Bilirubin: 0.2 mg/dL (ref 0.0–1.2)
Total Protein: 6.3 g/dL — ABNORMAL LOW (ref 6.5–8.1)

## 2023-10-22 LAB — TSH: TSH: 4.81 u[IU]/mL — ABNORMAL HIGH (ref 0.350–4.500)

## 2023-10-22 NOTE — Telephone Encounter (Signed)
 Patient has been scheduled for follow-up visit per 10/22/23 LOS.  Pt given an appt calendar with date and time.

## 2023-10-23 DIAGNOSIS — Z681 Body mass index (BMI) 19 or less, adult: Secondary | ICD-10-CM | POA: Diagnosis not present

## 2023-10-23 DIAGNOSIS — I10 Essential (primary) hypertension: Secondary | ICD-10-CM | POA: Diagnosis not present

## 2023-10-23 DIAGNOSIS — N390 Urinary tract infection, site not specified: Secondary | ICD-10-CM | POA: Diagnosis not present

## 2023-10-23 DIAGNOSIS — J449 Chronic obstructive pulmonary disease, unspecified: Secondary | ICD-10-CM | POA: Diagnosis not present

## 2023-10-23 LAB — T4: T4, Total: 7.3 ug/dL (ref 4.5–12.0)

## 2023-10-29 ENCOUNTER — Inpatient Hospital Stay: Attending: Hematology and Oncology

## 2023-10-29 ENCOUNTER — Other Ambulatory Visit

## 2023-10-29 ENCOUNTER — Inpatient Hospital Stay

## 2023-10-29 DIAGNOSIS — C3490 Malignant neoplasm of unspecified part of unspecified bronchus or lung: Secondary | ICD-10-CM | POA: Diagnosis not present

## 2023-10-29 DIAGNOSIS — Z5112 Encounter for antineoplastic immunotherapy: Secondary | ICD-10-CM | POA: Insufficient documentation

## 2023-10-29 DIAGNOSIS — C3481 Malignant neoplasm of overlapping sites of right bronchus and lung: Secondary | ICD-10-CM

## 2023-10-29 DIAGNOSIS — Z79620 Long term (current) use of immunosuppressive biologic: Secondary | ICD-10-CM | POA: Insufficient documentation

## 2023-10-29 LAB — CBC WITH DIFFERENTIAL (CANCER CENTER ONLY)
Abs Immature Granulocytes: 0.02 K/uL (ref 0.00–0.07)
Basophils Absolute: 0.1 K/uL (ref 0.0–0.1)
Basophils Relative: 2 %
Eosinophils Absolute: 0.3 K/uL (ref 0.0–0.5)
Eosinophils Relative: 5 %
HCT: 36.1 % (ref 36.0–46.0)
Hemoglobin: 12 g/dL (ref 12.0–15.0)
Immature Granulocytes: 0 %
Lymphocytes Relative: 11 %
Lymphs Abs: 0.7 K/uL (ref 0.7–4.0)
MCH: 30.9 pg (ref 26.0–34.0)
MCHC: 33.2 g/dL (ref 30.0–36.0)
MCV: 93 fL (ref 80.0–100.0)
Monocytes Absolute: 0.5 K/uL (ref 0.1–1.0)
Monocytes Relative: 8 %
Neutro Abs: 4.8 K/uL (ref 1.7–7.7)
Neutrophils Relative %: 74 %
Platelet Count: 257 K/uL (ref 150–400)
RBC: 3.88 MIL/uL (ref 3.87–5.11)
RDW: 13.3 % (ref 11.5–15.5)
WBC Count: 6.4 K/uL (ref 4.0–10.5)
nRBC: 0 % (ref 0.0–0.2)

## 2023-10-29 LAB — CMP (CANCER CENTER ONLY)
ALT: 16 U/L (ref 0–44)
AST: 21 U/L (ref 15–41)
Albumin: 3.8 g/dL (ref 3.5–5.0)
Alkaline Phosphatase: 53 U/L (ref 38–126)
Anion gap: 10 (ref 5–15)
BUN: 7 mg/dL (ref 6–20)
CO2: 25 mmol/L (ref 22–32)
Calcium: 8.8 mg/dL — ABNORMAL LOW (ref 8.9–10.3)
Chloride: 102 mmol/L (ref 98–111)
Creatinine: 0.56 mg/dL (ref 0.44–1.00)
GFR, Estimated: >60 mL/min (ref >60)
Glucose, Bld: 83 mg/dL (ref 70–99)
Potassium: 4.2 mmol/L (ref 3.5–5.1)
Sodium: 137 mmol/L (ref 135–145)
Total Bilirubin: 0.2 mg/dL (ref 0.0–1.2)
Total Protein: 6.2 g/dL — ABNORMAL LOW (ref 6.5–8.1)

## 2023-10-29 LAB — TSH: TSH: 2.08 u[IU]/mL (ref 0.350–4.500)

## 2023-10-29 MED ORDER — SODIUM CHLORIDE 0.9% FLUSH: 10.0000 mL | INTRAVENOUS | Status: DC | PRN

## 2023-10-29 MED ORDER — SODIUM CHLORIDE 0.9 % IV SOLN: Freq: Once | INTRAVENOUS | Status: AC

## 2023-10-29 MED ORDER — SODIUM CHLORIDE 0.9 % IV SOLN
200.0000 mg | Freq: Once | INTRAVENOUS | Status: AC
  Administered 2023-10-29: 200 mg via INTRAVENOUS
  Filled 2023-10-29: qty 8

## 2023-10-29 NOTE — Progress Notes (Signed)
 Declines flu vaccine.

## 2023-10-29 NOTE — Patient Instructions (Signed)

## 2023-10-30 LAB — T4: T4, Total: 6.7 ug/dL (ref 4.5–12.0)

## 2023-11-04 DIAGNOSIS — R051 Acute cough: Secondary | ICD-10-CM | POA: Diagnosis not present

## 2023-11-04 DIAGNOSIS — R0981 Nasal congestion: Secondary | ICD-10-CM | POA: Diagnosis not present

## 2023-11-04 DIAGNOSIS — J449 Chronic obstructive pulmonary disease, unspecified: Secondary | ICD-10-CM | POA: Diagnosis not present

## 2023-11-04 DIAGNOSIS — R0600 Dyspnea, unspecified: Secondary | ICD-10-CM | POA: Diagnosis not present

## 2023-11-26 ENCOUNTER — Other Ambulatory Visit: Payer: Self-pay | Admitting: Vascular Surgery

## 2023-12-01 DIAGNOSIS — Z041 Encounter for examination and observation following transport accident: Secondary | ICD-10-CM | POA: Diagnosis not present

## 2023-12-03 ENCOUNTER — Ambulatory Visit: Admitting: Oncology

## 2023-12-03 ENCOUNTER — Ambulatory Visit

## 2023-12-03 ENCOUNTER — Other Ambulatory Visit

## 2023-12-05 DIAGNOSIS — R350 Frequency of micturition: Secondary | ICD-10-CM | POA: Diagnosis not present

## 2023-12-05 DIAGNOSIS — Z681 Body mass index (BMI) 19 or less, adult: Secondary | ICD-10-CM | POA: Diagnosis not present

## 2023-12-09 NOTE — Progress Notes (Signed)
 [facility] at [facility] [address] [place],  [place]  [address] [phone]  Clinic Day:  12/10/2023  Referring physician: Venancio Pock, PA-C   HISTORY OF PRESENT ILLNESS:  The patient is a 58 y.o. female with stage IVA lung adenocarcinoma.  This staging is based upon initial scans showing bilateral lung involvement with her cancer.  She comes in today to be evaluated before heading into her 44th cycle of maintenance pembrolizumab  immunotherapy.  The patient claims to have tolerated her last cycle of treatment very well.  However, she has had more of a productive cough recently.  She is concerned she may be developing another pneumonia.  Despite this, she is willing to proceed with her next cycle of pembrolizumab  immunotherapy.   PHYSICAL EXAM:  Blood pressure 118/87, pulse (!) 134, temperature 98.5 F (36.9 C), temperature source Oral, resp. rate 16, height 5' 5 (1.651 m), weight 99 lb 12.8 oz (45.3 kg), SpO2 91%.   Wt Readings from Last 3 Encounters:  12/10/23 99 lb 12.8 oz (45.3 kg)  10/22/23 102 lb 11.2 oz (46.6 kg)  07/23/23 113 lb 6.4 oz (51.4 kg)   Body mass index is 16.61 kg/m. Performance status (ECOG): 1 - Symptomatic but completely ambulatory Physical Exam Constitutional:      Appearance: Normal appearance. She is not ill-appearing.  HENT:     Mouth/Throat:     Mouth: Mucous membranes are moist.     Pharynx: Oropharynx is clear. No oropharyngeal exudate or posterior oropharyngeal erythema.  Cardiovascular:     Rate and Rhythm: Normal rate and regular rhythm.     Heart sounds: No murmur heard.    No friction rub. No gallop.  Pulmonary:     Effort: Pulmonary effort is normal. No respiratory distress.     Breath sounds: Decreased breath sounds present. No wheezing, rhonchi or rales.  Abdominal:     General: Bowel sounds are normal. There is no distension.     Palpations: Abdomen is soft. There is no mass.     Tenderness: There is no  abdominal tenderness.  Musculoskeletal:        General: No swelling.     Right lower leg: No edema.     Left lower leg: No edema.  Lymphadenopathy:     Cervical: No cervical adenopathy.     Upper Body:     Right upper body: No supraclavicular or axillary adenopathy.     Left upper body: No supraclavicular or axillary adenopathy.     Lower Body: No right inguinal adenopathy. No left inguinal adenopathy.  Skin:    General: Skin is warm.     Coloration: Skin is not jaundiced.     Findings: No lesion or rash.  Neurological:     General: No focal deficit present.     Mental Status: She is alert and oriented to person, place, and time. Mental status is at baseline.  Psychiatric:        Mood and Affect: Mood normal.        Behavior: Behavior normal.        Thought Content: Thought content normal.   He is LABS:      Latest Ref Rng & Units 12/10/2023    1:14 PM 10/29/2023   10:01 AM 10/22/2023   10:55 AM  CBC  WBC 4.0 - 10.5 K/uL 8.6  6.4  7.1   Hemoglobin 12.0 - 15.0 g/dL 85.3  87.9  87.1   Hematocrit 36.0 - 46.0 %  44.0  36.1  38.0   Platelets 150 - 400 K/uL 258  257  444       Latest Ref Rng & Units 12/10/2023    1:14 PM 10/29/2023   10:01 AM 10/22/2023   10:55 AM  CMP  Glucose 70 - 99 mg/dL 899  83  96   BUN 6 - 20 mg/dL 9  7  6    Creatinine 0.44 - 1.00 mg/dL 9.39  9.43  9.42   Sodium 135 - 145 mmol/L 135  137  136   Potassium 3.5 - 5.1 mmol/L 3.8  4.2  4.0   Chloride 98 - 111 mmol/L 99  102  100   CO2 22 - 32 mmol/L 26  25  26    Calcium 8.9 - 10.3 mg/dL 9.8  8.8  9.0   Total Protein 6.5 - 8.1 g/dL 7.3  6.2  6.3   Total Bilirubin 0.0 - 1.2 mg/dL 0.3  0.2  0.2   Alkaline Phos 38 - 126 U/L 72  53  56   AST 15 - 41 U/L 28  21  20    ALT 0 - 44 U/L 26  16  17     ASSESSMENT & PLAN:  Assessment/Plan:  A 58 y.o. female with stage IVA lung adenocarcinoma.  The patient will receive a full 44th cycle of pembrolizumab  today.  I will prescribe her azithromycin  500 mg, which she  will take for 5 consecutive days.  Hopefully, this will lead to some of her upper respiratory symptoms improving.  Overall, she appears to be doing okay.  I will see her back 6 weeks later before she heads into her 45th cycle of pembrolizumab  immunotherapy.  The patient understands all the plans discussed today and is in agreement with them.   Thaine Garriga DELENA Kerns, MD

## 2023-12-10 ENCOUNTER — Telehealth: Payer: Self-pay | Admitting: Oncology

## 2023-12-10 ENCOUNTER — Inpatient Hospital Stay

## 2023-12-10 ENCOUNTER — Inpatient Hospital Stay: Attending: Hematology and Oncology | Admitting: Oncology

## 2023-12-10 ENCOUNTER — Other Ambulatory Visit: Payer: Self-pay | Admitting: Oncology

## 2023-12-10 ENCOUNTER — Encounter: Payer: Self-pay | Admitting: Oncology

## 2023-12-10 VITALS — HR 97

## 2023-12-10 VITALS — BP 118/87 | HR 134 | Temp 98.5°F | Resp 16 | Ht 65.0 in | Wt 99.8 lb

## 2023-12-10 DIAGNOSIS — Z5112 Encounter for antineoplastic immunotherapy: Secondary | ICD-10-CM | POA: Diagnosis not present

## 2023-12-10 DIAGNOSIS — C3490 Malignant neoplasm of unspecified part of unspecified bronchus or lung: Secondary | ICD-10-CM | POA: Insufficient documentation

## 2023-12-10 DIAGNOSIS — Z79620 Long term (current) use of immunosuppressive biologic: Secondary | ICD-10-CM | POA: Diagnosis not present

## 2023-12-10 DIAGNOSIS — C3481 Malignant neoplasm of overlapping sites of right bronchus and lung: Secondary | ICD-10-CM | POA: Diagnosis not present

## 2023-12-10 DIAGNOSIS — Z23 Encounter for immunization: Secondary | ICD-10-CM | POA: Diagnosis not present

## 2023-12-10 LAB — CBC WITH DIFFERENTIAL (CANCER CENTER ONLY)
Abs Immature Granulocytes: 0.03 K/uL (ref 0.00–0.07)
Basophils Absolute: 0.1 K/uL (ref 0.0–0.1)
Basophils Relative: 1 %
Eosinophils Absolute: 0.1 K/uL (ref 0.0–0.5)
Eosinophils Relative: 1 %
HCT: 44 % (ref 36.0–46.0)
Hemoglobin: 14.6 g/dL (ref 12.0–15.0)
Immature Granulocytes: 0 %
Lymphocytes Relative: 12 %
Lymphs Abs: 1 K/uL (ref 0.7–4.0)
MCH: 30.6 pg (ref 26.0–34.0)
MCHC: 33.2 g/dL (ref 30.0–36.0)
MCV: 92.2 fL (ref 80.0–100.0)
Monocytes Absolute: 0.8 K/uL (ref 0.1–1.0)
Monocytes Relative: 9 %
Neutro Abs: 6.6 K/uL (ref 1.7–7.7)
Neutrophils Relative %: 77 %
Platelet Count: 258 K/uL (ref 150–400)
RBC: 4.77 MIL/uL (ref 3.87–5.11)
RDW: 13.1 % (ref 11.5–15.5)
WBC Count: 8.6 K/uL (ref 4.0–10.5)
nRBC: 0 % (ref 0.0–0.2)

## 2023-12-10 LAB — CMP (CANCER CENTER ONLY)
ALT: 26 U/L (ref 0–44)
AST: 28 U/L (ref 15–41)
Albumin: 4.3 g/dL (ref 3.5–5.0)
Alkaline Phosphatase: 72 U/L (ref 38–126)
Anion gap: 11 (ref 5–15)
BUN: 9 mg/dL (ref 6–20)
CO2: 26 mmol/L (ref 22–32)
Calcium: 9.8 mg/dL (ref 8.9–10.3)
Chloride: 99 mmol/L (ref 98–111)
Creatinine: 0.6 mg/dL (ref 0.44–1.00)
GFR, Estimated: >60 mL/min (ref >60)
Glucose, Bld: 100 mg/dL — ABNORMAL HIGH (ref 70–99)
Potassium: 3.8 mmol/L (ref 3.5–5.1)
Sodium: 135 mmol/L (ref 135–145)
Total Bilirubin: 0.3 mg/dL (ref 0.0–1.2)
Total Protein: 7.3 g/dL (ref 6.5–8.1)

## 2023-12-10 LAB — TSH: TSH: 3.49 u[IU]/mL (ref 0.350–4.500)

## 2023-12-10 MED ORDER — SODIUM CHLORIDE 0.9 % IV SOLN: Freq: Once | INTRAVENOUS | Status: AC

## 2023-12-10 MED ORDER — INFLUENZA VIRUS VACC SPLIT PF (FLUZONE) 0.5 ML IM SUSY
0.5000 mL | PREFILLED_SYRINGE | Freq: Once | INTRAMUSCULAR | Status: AC
  Administered 2023-12-10: 0.5 mL via INTRAMUSCULAR
  Filled 2023-12-10: qty 0.5

## 2023-12-10 MED ORDER — SODIUM CHLORIDE 0.9 % IV SOLN
200.0000 mg | Freq: Once | INTRAVENOUS | Status: AC
  Administered 2023-12-10: 200 mg via INTRAVENOUS
  Filled 2023-12-10: qty 200

## 2023-12-10 MED ORDER — AZITHROMYCIN 500 MG PO TABS: 500.0000 mg | ORAL_TABLET | Freq: Every day | ORAL | 0 refills | Status: DC

## 2023-12-10 NOTE — Patient Instructions (Signed)
 Pembrolizumab Injection What is this medication? PEMBROLIZUMAB (PEM broe LIZ ue mab) treats some types of cancer. It works by helping your immune system slow or stop the spread of cancer cells. It is a monoclonal antibody. This medicine may be used for other purposes; ask your health care provider or pharmacist if you have questions. COMMON BRAND NAME(S): Keytruda What should I tell my care team before I take this medication? They need to know if you have any of these conditions: Allogeneic stem cell transplant (uses someone else's stem cells) Autoimmune diseases, such as Crohn disease, ulcerative colitis, lupus History of chest radiation Nervous system problems, such as Guillain-Barre syndrome, myasthenia gravis Organ transplant An unusual or allergic reaction to pembrolizumab, other medications, foods, dyes, or preservatives Pregnant or trying to get pregnant Breast-feeding How should I use this medication? This medication is injected into a vein. It is given by your care team in a hospital or clinic setting. A special MedGuide will be given to you before each treatment. Be sure to read this information carefully each time. Talk to your care team about the use of this medication in children. While it may be prescribed for children as young as 6 months for selected conditions, precautions do apply. Overdosage: If you think you have taken too much of this medicine contact a poison control center or emergency room at once. NOTE: This medicine is only for you. Do not share this medicine with others. What if I miss a dose? Keep appointments for follow-up doses. It is important not to miss your dose. Call your care team if you are unable to keep an appointment. What may interact with this medication? Interactions have not been studied. This list may not describe all possible interactions. Give your health care provider a list of all the medicines, herbs, non-prescription drugs, or dietary  supplements you use. Also tell them if you smoke, drink alcohol, or use illegal drugs. Some items may interact with your medicine. What should I watch for while using this medication? Your condition will be monitored carefully while you are receiving this medication. You may need blood work while taking this medication. This medication may cause serious skin reactions. They can happen weeks to months after starting the medication. Contact your care team right away if you notice fevers or flu-like symptoms with a rash. The rash may be red or purple and then turn into blisters or peeling of the skin. You may also notice a red rash with swelling of the face, lips, or lymph nodes in your neck or under your arms. Tell your care team right away if you have any change in your eyesight. Talk to your care team if you may be pregnant. Serious birth defects can occur if you take this medication during pregnancy and for 4 months after the last dose. You will need a negative pregnancy test before starting this medication. Contraception is recommended while taking this medication and for 4 months after the last dose. Your care team can help you find the option that works for you. Do not breastfeed while taking this medication and for 4 months after the last dose. What side effects may I notice from receiving this medication? Side effects that you should report to your care team as soon as possible: Allergic reactions--skin rash, itching, hives, swelling of the face, lips, tongue, or throat Dry cough, shortness of breath or trouble breathing Eye pain, redness, irritation, or discharge with blurry or decreased vision Heart muscle inflammation--unusual weakness or  fatigue, shortness of breath, chest pain, fast or irregular heartbeat, dizziness, swelling of the ankles, feet, or hands Hormone gland problems--headache, sensitivity to light, unusual weakness or fatigue, dizziness, fast or irregular heartbeat, increased  sensitivity to cold or heat, excessive sweating, constipation, hair loss, increased thirst or amount of urine, tremors or shaking, irritability Infusion reactions--chest pain, shortness of breath or trouble breathing, feeling faint or lightheaded Kidney injury (glomerulonephritis)--decrease in the amount of urine, red or dark brown urine, foamy or bubbly urine, swelling of the ankles, hands, or feet Liver injury--right upper belly pain, loss of appetite, nausea, light-colored stool, dark yellow or brown urine, yellowing skin or eyes, unusual weakness or fatigue Pain, tingling, or numbness in the hands or feet, muscle weakness, change in vision, confusion or trouble speaking, loss of balance or coordination, trouble walking, seizures Rash, fever, and swollen lymph nodes Redness, blistering, peeling, or loosening of the skin, including inside the mouth Sudden or severe stomach pain, bloody diarrhea, fever, nausea, vomiting Side effects that usually do not require medical attention (report to your care team if they continue or are bothersome): Bone, joint, or muscle pain Diarrhea Fatigue Loss of appetite Nausea Skin rash This list may not describe all possible side effects. Call your doctor for medical advice about side effects. You may report side effects to FDA at 1-800-FDA-1088. Where should I keep my medication? This medication is given in a hospital or clinic. It will not be stored at home. NOTE: This sheet is a summary. It may not cover all possible information. If you have questions about this medicine, talk to your doctor, pharmacist, or health care provider.  2024 Elsevier/Gold Standard (2021-05-24 00:00:00)  [facility] [place] - [facility]  Discharge Instructions: Thank you for choosing [facility] to provide your oncology and hematology care.  If you have a lab appointment with the Cancer Center, please go directly to the Cancer Center  and check in at the registration area.   Wear comfortable clothing and clothing appropriate for easy access to any Portacath or PICC line.   We strive to give you quality time with your provider. You may need to reschedule your appointment if you arrive late (15 or more minutes).  Arriving late affects you and other patients whose appointments are after yours.  Also, if you miss three or more appointments without notifying the office, you may be dismissed from the clinic at the provider's discretion.      For prescription refill requests, have your pharmacy contact our office and allow 72 hours for refills to be completed.    Today you received the following chemotherapy and/or immunotherapy agents PEMBROLIZUMAB      To help prevent nausea and vomiting after your treatment, we encourage you to take your nausea medication as directed.  BELOW ARE SYMPTOMS THAT SHOULD BE REPORTED IMMEDIATELY: *FEVER GREATER THAN 100.4 F (38 C) OR HIGHER *CHILLS OR SWEATING *NAUSEA AND VOMITING THAT IS NOT CONTROLLED WITH YOUR NAUSEA MEDICATION *UNUSUAL SHORTNESS OF BREATH *UNUSUAL BRUISING OR BLEEDING *URINARY PROBLEMS (pain or burning when urinating, or frequent urination) *BOWEL PROBLEMS (unusual diarrhea, constipation, pain near the anus) TENDERNESS IN MOUTH AND THROAT WITH OR WITHOUT PRESENCE OF ULCERS (sore throat, sores in mouth, or a toothache) UNUSUAL RASH, SWELLING OR PAIN  UNUSUAL VAGINAL DISCHARGE OR ITCHING   Items with * indicate a potential emergency and should be followed up as soon as possible or go to the Emergency Department  if any problems should occur.  Please show the CHEMOTHERAPY ALERT CARD or IMMUNOTHERAPY ALERT CARD at check-in to the Emergency Department and triage nurse.  Should you have questions after your visit or need to cancel or reschedule your appointment, please contact [facility] [place] - [facility]  Dept: [phone]  and follow the  prompts.  Office hours are 8:00 a.m. to 4:30 p.m. Monday - Friday. Please note that voicemails left after 4:00 p.m. may not be returned until the following business day.  We are closed weekends and major holidays. You have access to a nurse at all times for urgent questions. Please call the main number to the clinic Dept: [phone] and follow the prompts.  For any non-urgent questions, you may also contact your provider using MyChart. We now offer e-Visits for anyone 78 and older to request care online for non-urgent symptoms. For details visit [email or web address].   Also download the MyChart app! Go to the app store, search "MyChart", open the app, select [facility], and log in with your MyChart username and password.

## 2023-12-10 NOTE — Telephone Encounter (Signed)
 Patient has been scheduled for follow-up visit per 12/10/23 LOS.  Pt given an appt calendar with date and time.

## 2023-12-11 LAB — T4: T4, Total: 9.4 ug/dL (ref 4.5–12.0)

## 2023-12-14 ENCOUNTER — Telehealth: Payer: Self-pay

## 2023-12-14 NOTE — Telephone Encounter (Signed)
 Pt states thatDr Ezzard gave me Z pak as we thought I had pneumonia. I need something stronger. Message sent to Dr Ezzard.  Dr Ezzard recommends Levaquin  500mg  po qd x 5 days. No refills.

## 2023-12-15 ENCOUNTER — Encounter: Payer: Self-pay | Admitting: Oncology

## 2024-01-09 ENCOUNTER — Telehealth: Payer: Self-pay | Admitting: Pharmacy Technician

## 2024-01-09 NOTE — Telephone Encounter (Signed)
 Application in process for Keytruda  Co-Pay

## 2024-01-11 DIAGNOSIS — Z681 Body mass index (BMI) 19 or less, adult: Secondary | ICD-10-CM | POA: Diagnosis not present

## 2024-01-11 DIAGNOSIS — J208 Acute bronchitis due to other specified organisms: Secondary | ICD-10-CM | POA: Diagnosis not present

## 2024-01-15 ENCOUNTER — Encounter: Payer: Self-pay | Admitting: Oncology

## 2024-01-20 NOTE — Progress Notes (Deleted)
 " [facility] at [facility] [address] [place],  [place]  [address] [phone]  Clinic Day:  12/10/2023  Referring physician: Venancio Pock, PA-C   HISTORY OF PRESENT ILLNESS:  The patient is a 58 y.o. female with stage IVA lung adenocarcinoma.  This staging is based upon initial scans showing bilateral lung involvement with her cancer.  She comes in today to be evaluated before heading into her 45th cycle of maintenance pembrolizumab  immunotherapy.  The patient claims to have tolerated her last cycle of treatment very well.  However, she has had more of a productive cough recently.  She is concerned she may be developing another pneumonia.  Despite this, she is willing to proceed with her next cycle of pembrolizumab  immunotherapy.   PHYSICAL EXAM:  There were no vitals taken for this visit.   Wt Readings from Last 3 Encounters:  12/10/23 99 lb 12.8 oz (45.3 kg)  10/22/23 102 lb 11.2 oz (46.6 kg)  07/23/23 113 lb 6.4 oz (51.4 kg)   There is no height or weight on file to calculate BMI. Performance status (ECOG): 1 - Symptomatic but completely ambulatory Physical Exam Constitutional:      Appearance: Normal appearance. She is not ill-appearing.  HENT:     Mouth/Throat:     Mouth: Mucous membranes are moist.     Pharynx: Oropharynx is clear. No oropharyngeal exudate or posterior oropharyngeal erythema.  Cardiovascular:     Rate and Rhythm: Normal rate and regular rhythm.     Heart sounds: No murmur heard.    No friction rub. No gallop.  Pulmonary:     Effort: Pulmonary effort is normal. No respiratory distress.     Breath sounds: Decreased breath sounds present. No wheezing, rhonchi or rales.  Abdominal:     General: Bowel sounds are normal. There is no distension.     Palpations: Abdomen is soft. There is no mass.     Tenderness: There is no abdominal tenderness.  Musculoskeletal:        General: No swelling.     Right lower leg: No edema.     Left  lower leg: No edema.  Lymphadenopathy:     Cervical: No cervical adenopathy.     Upper Body:     Right upper body: No supraclavicular or axillary adenopathy.     Left upper body: No supraclavicular or axillary adenopathy.     Lower Body: No right inguinal adenopathy. No left inguinal adenopathy.  Skin:    General: Skin is warm.     Coloration: Skin is not jaundiced.     Findings: No lesion or rash.  Neurological:     General: No focal deficit present.     Mental Status: She is alert and oriented to person, place, and time. Mental status is at baseline.  Psychiatric:        Mood and Affect: Mood normal.        Behavior: Behavior normal.        Thought Content: Thought content normal.   He is LABS:      Latest Ref Rng & Units 12/10/2023    1:14 PM 10/29/2023   10:01 AM 10/22/2023   10:55 AM  CBC  WBC 4.0 - 10.5 K/uL 8.6  6.4  7.1   Hemoglobin 12.0 - 15.0 g/dL 85.3  87.9  87.1   Hematocrit 36.0 - 46.0 % 44.0  36.1  38.0   Platelets 150 - 400 K/uL 258  257  444       Latest Ref Rng & Units 12/10/2023    1:14 PM 10/29/2023   10:01 AM 10/22/2023   10:55 AM  CMP  Glucose 70 - 99 mg/dL 899  83  96   BUN 6 - 20 mg/dL 9  7  6    Creatinine 0.44 - 1.00 mg/dL 9.39  9.43  9.42   Sodium 135 - 145 mmol/L 135  137  136   Potassium 3.5 - 5.1 mmol/L 3.8  4.2  4.0   Chloride 98 - 111 mmol/L 99  102  100   CO2 22 - 32 mmol/L 26  25  26    Calcium 8.9 - 10.3 mg/dL 9.8  8.8  9.0   Total Protein 6.5 - 8.1 g/dL 7.3  6.2  6.3   Total Bilirubin 0.0 - 1.2 mg/dL 0.3  0.2  0.2   Alkaline Phos 38 - 126 U/L 72  53  56   AST 15 - 41 U/L 28  21  20    ALT 0 - 44 U/L 26  16  17     ASSESSMENT & PLAN:  Assessment/Plan:  A 58 y.o. female with stage IVA lung adenocarcinoma.  The patient will receive a full 44th cycle of pembrolizumab  today.  I will prescribe her azithromycin  500 mg, which she will take for 5 consecutive days.  Hopefully, this will lead to some of her upper respiratory symptoms improving.   Overall, she appears to be doing okay.  I will see her back 6 weeks later before she heads into her 45th cycle of pembrolizumab  immunotherapy.  The patient understands all the plans discussed today and is in agreement with them.   Rosaisela Jamroz DELENA Kerns, MD       "

## 2024-01-21 ENCOUNTER — Inpatient Hospital Stay: Admitting: Oncology

## 2024-01-21 ENCOUNTER — Inpatient Hospital Stay

## 2024-01-21 ENCOUNTER — Telehealth: Payer: Self-pay | Admitting: Pharmacy Technician

## 2024-01-21 NOTE — Telephone Encounter (Signed)
 Keytruda  re-enrollment in process

## 2024-01-28 ENCOUNTER — Inpatient Hospital Stay: Attending: Hematology and Oncology

## 2024-01-28 ENCOUNTER — Inpatient Hospital Stay: Admitting: Hematology and Oncology

## 2024-01-28 ENCOUNTER — Inpatient Hospital Stay

## 2024-02-04 ENCOUNTER — Encounter: Payer: Self-pay | Admitting: Oncology

## 2024-03-10 ENCOUNTER — Inpatient Hospital Stay

## 2024-03-10 ENCOUNTER — Inpatient Hospital Stay: Admitting: Oncology
